# Patient Record
Sex: Male | Born: 1970
Health system: Southern US, Community
[De-identification: ages and names within clinical notes are randomized; demographics above are authoritative.]

## PROBLEM LIST (undated history)

## (undated) DIAGNOSIS — J9383 Other pneumothorax: Secondary | ICD-10-CM

## (undated) DIAGNOSIS — C801 Malignant (primary) neoplasm, unspecified: Secondary | ICD-10-CM

## (undated) DIAGNOSIS — E785 Hyperlipidemia, unspecified: Secondary | ICD-10-CM

## (undated) DIAGNOSIS — E079 Disorder of thyroid, unspecified: Secondary | ICD-10-CM

## (undated) DIAGNOSIS — I10 Essential (primary) hypertension: Secondary | ICD-10-CM

## (undated) DIAGNOSIS — K219 Gastro-esophageal reflux disease without esophagitis: Secondary | ICD-10-CM

## (undated) DIAGNOSIS — T7840XA Allergy, unspecified, initial encounter: Secondary | ICD-10-CM

## (undated) DIAGNOSIS — C73 Malignant neoplasm of thyroid gland: Secondary | ICD-10-CM

## (undated) DIAGNOSIS — W3400XD Accidental discharge from unspecified firearms or gun, subsequent encounter: Secondary | ICD-10-CM

## (undated) DIAGNOSIS — J45909 Unspecified asthma, uncomplicated: Secondary | ICD-10-CM

## (undated) DIAGNOSIS — J939 Pneumothorax, unspecified: Secondary | ICD-10-CM

## (undated) DIAGNOSIS — F1910 Other psychoactive substance abuse, uncomplicated: Secondary | ICD-10-CM

## (undated) DIAGNOSIS — F1410 Cocaine abuse, uncomplicated: Secondary | ICD-10-CM

## (undated) HISTORY — DX: Gastro-esophageal reflux disease without esophagitis: K21.9

## (undated) HISTORY — PX: VIDEO ASSISTED THORACOSCOPY (VATS)/WEDGE RESECTION: SHX6174

## (undated) HISTORY — DX: Allergy, unspecified, initial encounter: T78.40XA

## (undated) HISTORY — PX: CHEST TUBE INSERTION: SHX231

## (undated) HISTORY — PX: TESTICLE TORSION REDUCTION: SHX795

## (undated) HISTORY — DX: Malignant (primary) neoplasm, unspecified: C80.1

## (undated) HISTORY — DX: Accidental discharge from unspecified firearms or gun, subsequent encounter: W34.00XD

## (undated) HISTORY — DX: Disorder of thyroid, unspecified: E07.9

## (undated) HISTORY — DX: Essential (primary) hypertension: I10

## (undated) HISTORY — PX: THYROID SURGERY: SHX805

## (undated) HISTORY — PX: KNEE SURGERY: SHX244

## (undated) HISTORY — DX: Hyperlipidemia, unspecified: E78.5

---
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
## 1997-07-23 ENCOUNTER — Emergency Department (HOSPITAL_COMMUNITY): Admission: EM | Admit: 1997-07-23 | Discharge: 1997-07-23 | Payer: Self-pay | Admitting: Emergency Medicine

## 1998-04-15 ENCOUNTER — Emergency Department (HOSPITAL_COMMUNITY): Admission: EM | Admit: 1998-04-15 | Discharge: 1998-04-15 | Payer: Self-pay | Admitting: Emergency Medicine

## 1998-04-15 ENCOUNTER — Encounter: Payer: Self-pay | Admitting: Emergency Medicine

## 1999-10-10 ENCOUNTER — Emergency Department (HOSPITAL_COMMUNITY): Admission: EM | Admit: 1999-10-10 | Discharge: 1999-10-10 | Payer: Self-pay | Admitting: Emergency Medicine

## 2000-08-16 ENCOUNTER — Encounter: Payer: Self-pay | Admitting: Emergency Medicine

## 2000-08-16 ENCOUNTER — Inpatient Hospital Stay (HOSPITAL_COMMUNITY): Admission: EM | Admit: 2000-08-16 | Discharge: 2000-08-22 | Payer: Self-pay | Admitting: Emergency Medicine

## 2004-03-16 DIAGNOSIS — W3400XD Accidental discharge from unspecified firearms or gun, subsequent encounter: Secondary | ICD-10-CM

## 2004-03-16 HISTORY — DX: Accidental discharge from unspecified firearms or gun, subsequent encounter: W34.00XD

## 2004-04-30 ENCOUNTER — Emergency Department (HOSPITAL_COMMUNITY): Admission: EM | Admit: 2004-04-30 | Discharge: 2004-05-01 | Payer: Self-pay | Admitting: Emergency Medicine

## 2004-07-24 ENCOUNTER — Inpatient Hospital Stay (HOSPITAL_COMMUNITY): Admission: AC | Admit: 2004-07-24 | Discharge: 2004-07-29 | Payer: Self-pay

## 2004-08-06 ENCOUNTER — Ambulatory Visit: Payer: Self-pay | Admitting: Internal Medicine

## 2004-08-06 ENCOUNTER — Ambulatory Visit: Payer: Self-pay | Admitting: *Deleted

## 2004-08-15 ENCOUNTER — Emergency Department (HOSPITAL_COMMUNITY): Admission: EM | Admit: 2004-08-15 | Discharge: 2004-08-15 | Payer: Self-pay | Admitting: Emergency Medicine

## 2004-08-21 ENCOUNTER — Emergency Department (HOSPITAL_COMMUNITY): Admission: EM | Admit: 2004-08-21 | Discharge: 2004-08-21 | Payer: Self-pay | Admitting: Emergency Medicine

## 2005-04-08 ENCOUNTER — Emergency Department (HOSPITAL_COMMUNITY): Admission: EM | Admit: 2005-04-08 | Discharge: 2005-04-08 | Payer: Self-pay | Admitting: Emergency Medicine

## 2007-07-21 ENCOUNTER — Emergency Department (HOSPITAL_COMMUNITY): Admission: EM | Admit: 2007-07-21 | Discharge: 2007-07-21 | Payer: Self-pay | Admitting: Emergency Medicine

## 2008-02-10 ENCOUNTER — Emergency Department (HOSPITAL_COMMUNITY): Admission: EM | Admit: 2008-02-10 | Discharge: 2008-02-10 | Payer: Self-pay | Admitting: Emergency Medicine

## 2008-10-04 ENCOUNTER — Emergency Department (HOSPITAL_COMMUNITY): Admission: EM | Admit: 2008-10-04 | Discharge: 2008-10-04 | Payer: Self-pay | Admitting: Emergency Medicine

## 2008-10-05 ENCOUNTER — Emergency Department (HOSPITAL_COMMUNITY): Admission: EM | Admit: 2008-10-05 | Discharge: 2008-10-05 | Payer: Self-pay | Admitting: Emergency Medicine

## 2009-04-30 ENCOUNTER — Emergency Department (HOSPITAL_COMMUNITY): Admission: EM | Admit: 2009-04-30 | Discharge: 2009-04-30 | Payer: Self-pay | Admitting: Family Medicine

## 2009-08-11 ENCOUNTER — Emergency Department (HOSPITAL_COMMUNITY): Admission: EM | Admit: 2009-08-11 | Discharge: 2009-08-11 | Payer: Self-pay

## 2009-10-09 ENCOUNTER — Emergency Department (HOSPITAL_COMMUNITY): Admission: EM | Admit: 2009-10-09 | Discharge: 2009-10-09 | Payer: Self-pay | Admitting: Emergency Medicine

## 2009-12-08 ENCOUNTER — Emergency Department (HOSPITAL_COMMUNITY): Admission: EM | Admit: 2009-12-08 | Discharge: 2009-12-08 | Payer: Self-pay | Admitting: Emergency Medicine

## 2010-03-16 DIAGNOSIS — C801 Malignant (primary) neoplasm, unspecified: Secondary | ICD-10-CM

## 2010-03-16 HISTORY — DX: Malignant (primary) neoplasm, unspecified: C80.1

## 2010-05-29 LAB — URINALYSIS, ROUTINE W REFLEX MICROSCOPIC
Hgb urine dipstick: NEGATIVE
pH: 7 (ref 5.0–8.0)

## 2010-05-29 LAB — URINE MICROSCOPIC-ADD ON

## 2010-05-31 LAB — URINALYSIS, ROUTINE W REFLEX MICROSCOPIC
Glucose, UA: NEGATIVE mg/dL
Ketones, ur: NEGATIVE mg/dL
Specific Gravity, Urine: 1.017 (ref 1.005–1.030)
Urobilinogen, UA: 1 mg/dL (ref 0.0–1.0)

## 2010-06-02 LAB — URINALYSIS, ROUTINE W REFLEX MICROSCOPIC
Bilirubin Urine: NEGATIVE
Glucose, UA: NEGATIVE mg/dL
Hgb urine dipstick: NEGATIVE
Ketones, ur: NEGATIVE mg/dL
Nitrite: NEGATIVE
Protein, ur: NEGATIVE mg/dL
Specific Gravity, Urine: 1.016 (ref 1.005–1.030)
Urobilinogen, UA: 1 mg/dL (ref 0.0–1.0)
pH: 6.5 (ref 5.0–8.0)

## 2010-06-02 LAB — POCT I-STAT, CHEM 8
Calcium, Ion: 1.13 mmol/L (ref 1.12–1.32)
Hemoglobin: 16 g/dL (ref 13.0–17.0)
Sodium: 143 mEq/L (ref 135–145)
TCO2: 30 mmol/L (ref 0–100)

## 2010-06-04 LAB — GC/CHLAMYDIA PROBE AMP, GENITAL: GC Probe Amp, Genital: NEGATIVE

## 2010-06-22 LAB — CBC
HCT: 43.2 % (ref 39.0–52.0)
Hemoglobin: 14.6 g/dL (ref 13.0–17.0)
MCV: 90 fL (ref 78.0–100.0)
Platelets: 279 10*3/uL (ref 150–400)
RDW: 14.2 % (ref 11.5–15.5)

## 2010-06-22 LAB — BASIC METABOLIC PANEL
Calcium: 9.3 mg/dL (ref 8.4–10.5)
Creatinine, Ser: 1.38 mg/dL (ref 0.4–1.5)
GFR calc non Af Amer: 57 mL/min — ABNORMAL LOW (ref >60)
Sodium: 142 mEq/L (ref 135–145)

## 2010-06-22 LAB — RAPID URINE DRUG SCREEN, HOSP PERFORMED
Barbiturates: NOT DETECTED
Benzodiazepines: NOT DETECTED
Benzodiazepines: NOT DETECTED
Cocaine: POSITIVE — AB
Opiates: NOT DETECTED
Opiates: NOT DETECTED

## 2010-06-22 LAB — DIFFERENTIAL
Basophils Relative: 2 % — ABNORMAL HIGH (ref 0–1)
Eosinophils Absolute: 0.2 10*3/uL (ref 0.0–0.7)
Monocytes Absolute: 0.6 10*3/uL (ref 0.1–1.0)
Neutro Abs: 3.9 10*3/uL (ref 1.7–7.7)
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
## 2010-12-16 LAB — TSH: TSH: 1.059

## 2011-02-21 ENCOUNTER — Encounter: Payer: Self-pay | Admitting: Nurse Practitioner

## 2011-02-21 ENCOUNTER — Emergency Department (HOSPITAL_COMMUNITY): Payer: Self-pay

## 2011-02-21 ENCOUNTER — Emergency Department (HOSPITAL_COMMUNITY)
Admission: EM | Admit: 2011-02-21 | Discharge: 2011-02-21 | Disposition: A | Discharge status: Home / Self Care | Payer: Self-pay | Attending: Emergency Medicine | Admitting: Emergency Medicine

## 2011-02-21 DIAGNOSIS — M545 Low back pain, unspecified: Secondary | ICD-10-CM | POA: Insufficient documentation

## 2011-02-21 DIAGNOSIS — M549 Dorsalgia, unspecified: Secondary | ICD-10-CM

## 2011-02-21 DIAGNOSIS — R3915 Urgency of urination: Secondary | ICD-10-CM | POA: Insufficient documentation

## 2011-02-21 DIAGNOSIS — R319 Hematuria, unspecified: Secondary | ICD-10-CM | POA: Insufficient documentation

## 2011-02-21 DIAGNOSIS — R1032 Left lower quadrant pain: Secondary | ICD-10-CM | POA: Insufficient documentation

## 2011-02-21 DIAGNOSIS — R350 Frequency of micturition: Secondary | ICD-10-CM | POA: Insufficient documentation

## 2011-02-21 LAB — POCT I-STAT, CHEM 8
BUN: 12 mg/dL (ref 6–23)
Calcium, Ion: 0.88 mmol/L — ABNORMAL LOW (ref 1.12–1.32)
Chloride: 108 mEq/L (ref 96–112)
HCT: 43 % (ref 39.0–52.0)
Sodium: 137 mEq/L (ref 135–145)

## 2011-02-21 LAB — DIFFERENTIAL
Basophils Absolute: 0.1 10*3/uL (ref 0.0–0.1)
Eosinophils Relative: 1 % (ref 0–5)
Lymphocytes Relative: 24 % (ref 12–46)
Lymphs Abs: 2.2 10*3/uL (ref 0.7–4.0)
Monocytes Absolute: 0.5 10*3/uL (ref 0.1–1.0)
Monocytes Relative: 6 % (ref 3–12)
Neutro Abs: 6.1 10*3/uL (ref 1.7–7.7)

## 2011-02-21 LAB — CBC
Hemoglobin: 14.3 g/dL (ref 13.0–17.0)
MCH: 30.2 pg (ref 26.0–34.0)
WBC: 8.9 10*3/uL (ref 4.0–10.5)

## 2011-02-21 LAB — URINE MICROSCOPIC-ADD ON

## 2011-02-21 LAB — URINALYSIS, ROUTINE W REFLEX MICROSCOPIC
Bilirubin Urine: NEGATIVE
Specific Gravity, Urine: 1.026 (ref 1.005–1.030)

## 2011-02-21 MED ORDER — KETOROLAC TROMETHAMINE 60 MG/2ML IM SOLN
60.0000 mg | Freq: Once | INTRAMUSCULAR | Status: AC
  Administered 2011-02-21: 60 mg via INTRAMUSCULAR
  Filled 2011-02-21: qty 2

## 2011-02-21 MED ORDER — METHOCARBAMOL 500 MG PO TABS: 500.0000 mg | ORAL_TABLET | Freq: Two times a day (BID) | ORAL | Status: AC

## 2011-02-21 MED ORDER — DICLOFENAC SODIUM 75 MG PO TBEC: 75.0000 mg | DELAYED_RELEASE_TABLET | Freq: Two times a day (BID) | ORAL | Status: DC

## 2011-02-21 MED ORDER — TRAMADOL HCL 50 MG PO TABS: 50.0000 mg | ORAL_TABLET | Freq: Four times a day (QID) | ORAL | Status: AC | PRN

## 2011-02-21 NOTE — ED Notes (Signed)
Pt c/o hematuria onset yesterday. Also mild abd and flank pain.

## 2011-02-21 NOTE — ED Provider Notes (Signed)
Medical screening examination/treatment/procedure(s) were performed by non-physician practitioner and as supervising physician I was immediately available for consultation/collaboration.  Ethelda Chick, MD 02/21/11 1300

## 2011-02-21 NOTE — ED Provider Notes (Signed)
History     CSN: [id number] Arrival date & time: 02/21/2011  9:23 AM   First MD Initiated Contact with Patient 02/21/11 0952     10:46 AM HPI Reports one episode of a blood clot mid stream while urinating yesterday. States has not had blood in his urine since. Reports hematuria associated with mild left-sided lower abdominal pain and left lower back pain. Denies nausea, vomiting, diarrhea, fever, chills, penile discharge, testicular tenderness. Patient is a 40 y.o. male presenting with hematuria. The history is provided by the patient.  Hematuria This is a new problem. The current episode started yesterday. The problem has been resolved since onset. He describes the hematuria as gross hematuria. He reports clotting at the middle of his urine stream. The pain is moderate. He describes his urine color as yellow. Irritative symptoms include frequency and urgency. Obstructive symptoms do not include dribbling. Associated symptoms include abdominal pain and flank pain. Pertinent negatives include no chills, dysuria, fever, genital pain, inability to urinate, nausea or vomiting.    History reviewed. No pertinent past medical history.  Past Surgical History  Procedure Date  . Thyroid surgery     History reviewed. No pertinent family history.  History  Substance Use Topics  . Smoking status: Passive Smoker  . Smokeless tobacco: Not on file  . Alcohol Use:      rare      Review of Systems  Constitutional: Negative for fever and chills.  Gastrointestinal: Positive for abdominal pain. Negative for nausea, vomiting, diarrhea and constipation.  Genitourinary: Positive for urgency, frequency, hematuria and flank pain. Negative for dysuria, penile pain and testicular pain.  Musculoskeletal: Positive for back pain.    Allergies  Review of patient's allergies indicates no known allergies.  Home Medications   Current Outpatient Rx  Name Route Sig Dispense Refill  . LEVOTHYROXINE SODIUM  75 MCG PO TABS Oral Take 75 mcg by mouth daily.        BP 131/88  Pulse 94  Temp(Src) 98.5 F (36.9 C) (Oral)  Resp 16  Ht 6\' 2"  (1.88 m)  Wt 240 lb (108.863 kg)  BMI 30.81 kg/m2  SpO2 96%  Physical Exam  Constitutional: He is oriented to person, place, and time. He appears well-developed and well-nourished.  HENT:  Head: Normocephalic and atraumatic.  Eyes: Conjunctivae are normal. Pupils are equal, round, and reactive to light.  Neck: Normal range of motion. Neck supple.  Cardiovascular: Normal rate, regular rhythm and normal heart sounds.   Pulmonary/Chest: Effort normal and breath sounds normal.  Abdominal: Soft. Bowel sounds are normal. He exhibits no distension and no mass. There is no hepatosplenomegaly. There is no tenderness. There is no rebound, no guarding and no CVA tenderness.  Musculoskeletal:       Lumbar back: Normal.  Neurological: He is alert and oriented to person, place, and time.  Skin: Skin is warm and dry. No rash noted. No erythema. No pallor.  Psychiatric: He has a normal mood and affect. His behavior is normal.    ED Course  Procedures   Results for orders placed during the hospital encounter of 02/21/11  URINALYSIS, ROUTINE W REFLEX MICROSCOPIC      Component Value Range   Color, Urine YELLOW  YELLOW    APPearance CLEAR  CLEAR    Specific Gravity, Urine 1.026  1.005 - 1.030    pH 6.0  5.0 - 8.0    Glucose, UA NEGATIVE  NEGATIVE (mg/dL)   Hgb urine dipstick NEGATIVE  NEGATIVE    Bilirubin Urine NEGATIVE  NEGATIVE    Ketones, ur NEGATIVE  NEGATIVE (mg/dL)   Protein, ur 30 (*) NEGATIVE (mg/dL)   Urobilinogen, UA 0.2  0.0 - 1.0 (mg/dL)   Nitrite NEGATIVE  NEGATIVE    Leukocytes, UA SMALL (*) NEGATIVE   URINE MICROSCOPIC-ADD ON      Component Value Range   Squamous Epithelial / LPF RARE  RARE    WBC, UA 7-10  <3 (WBC/hpf)   Bacteria, UA RARE  RARE    Urine-Other MUCOUS PRESENT    CBC      Component Value Range   WBC 8.9  4.0 - 10.5 (K/uL)     RBC 4.74  4.22 - 5.81 (MIL/uL)   Hemoglobin 14.3  13.0 - 17.0 (g/dL)   HCT 16.1  09.6 - 04.5 (%)   MCV 85.2  78.0 - 100.0 (fL)   MCH 30.2  26.0 - 34.0 (pg)   MCHC 35.4  30.0 - 36.0 (g/dL)   RDW 40.9  81.1 - 91.4 (%)   Platelets 271  150 - 400 (K/uL)  DIFFERENTIAL      Component Value Range   Neutrophils Relative 68  43 - 77 (%)   Neutro Abs 6.1  1.7 - 7.7 (K/uL)   Lymphocytes Relative 24  12 - 46 (%)   Lymphs Abs 2.2  0.7 - 4.0 (K/uL)   Monocytes Relative 6  3 - 12 (%)   Monocytes Absolute 0.5  0.1 - 1.0 (K/uL)   Eosinophils Relative 1  0 - 5 (%)   Eosinophils Absolute 0.1  0.0 - 0.7 (K/uL)   Basophils Relative 1  0 - 1 (%)   Basophils Absolute 0.1  0.0 - 0.1 (K/uL)  POCT I-STAT, CHEM 8      Component Value Range   Sodium 137  135 - 145 (mEq/L)   Potassium 4.1  3.5 - 5.1 (mEq/L)   Chloride 108  96 - 112 (mEq/L)   BUN 12  6 - 23 (mg/dL)   Creatinine, Ser 7.82 (*) 0.50 - 1.35 (mg/dL)   Glucose, Bld 956 (*) 70 - 99 (mg/dL)   Calcium, Ion 2.13 (*) 1.12 - 1.32 (mmol/L)   TCO2 18  0 - 100 (mmol/L)   Hemoglobin 14.6  13.0 - 17.0 (g/dL)   HCT 08.6  57.8 - 46.9 (%)    MDM   Patient has slight elevation in creatinine. This is gradually been increasing for 1-1/2 years. I have discussed this with patient and requests patient to followup with a primary care physician. I have provided patient with referrals for outpatient clinics and family practice Center. Also provided referral for urology if he continues to have blood in urine. Advised patient that he did not have blood in urine today. Discussed normal CT scan with patient. Will discharge patient with anti-inflammatory medication, muscle relaxants, and tramadol. I have also obtained a GC/Chlamydia probe from patient. Chaperone was present during the exam. Patient states he is unsure whether he may have a sexual transmitted disease. Denies dysuria, penile discharge, penile pain or testicular tenderness.       Thomasene Lot,  [place] 02/21/11 1255

## 2011-02-22 LAB — URINE CULTURE
Colony Count: NO GROWTH
Culture: NO GROWTH

## 2011-02-23 LAB — GC/CHLAMYDIA PROBE AMP, GENITAL: Chlamydia, DNA Probe: NEGATIVE

## 2011-03-04 ENCOUNTER — Ambulatory Visit: Payer: Self-pay | Admitting: Internal Medicine

## 2011-03-12 ENCOUNTER — Ambulatory Visit: Payer: Self-pay | Admitting: Internal Medicine

## 2011-04-08 ENCOUNTER — Ambulatory Visit: Payer: Self-pay | Admitting: Internal Medicine

## 2011-04-27 ENCOUNTER — Encounter: Payer: Self-pay | Admitting: Internal Medicine

## 2011-04-27 ENCOUNTER — Ambulatory Visit (INDEPENDENT_AMBULATORY_CARE_PROVIDER_SITE_OTHER): Payer: Self-pay | Admitting: Internal Medicine

## 2011-04-27 VITALS — BP 112/72 | HR 77 | Temp 97.5°F | Resp 16 | Ht 74.0 in | Wt 248.0 lb

## 2011-04-27 DIAGNOSIS — C73 Malignant neoplasm of thyroid gland: Secondary | ICD-10-CM

## 2011-04-27 NOTE — Assessment & Plan Note (Signed)
Records requested from his ENDO and surgeon in the [place] area, I have asked him to see an ENDO here

## 2011-04-27 NOTE — Progress Notes (Signed)
  Subjective:    Patient ID: Nicholas Mccarty, male    DOB: 1970/04/02, 41 y.o.   MRN: [id number]  Thyroid Problem Presents for follow-up (he had surgery about 6 months ago for L lobe thyroid cancer, needs ENDO f/up) visit. Patient reports no anxiety, cold intolerance, constipation, depressed mood, diaphoresis, diarrhea, dry skin, fatigue, hair loss, heat intolerance, hoarse voice, leg swelling, nail problem, palpitations, tremors, visual change, weight gain or weight loss.  He was in prison when thyroid cancer was found, he had surgery twice in the fall of 2012, he is concerned there is more cancer, his last Endo (Dr. Governor Rooks) told him that he needed a radioactive treatment but they will not see him anymore because he does not have insurance.    Review of Systems  Constitutional: Negative for fever, chills, weight loss, weight gain, diaphoresis, activity change, appetite change, fatigue and unexpected weight change.  HENT: Negative for hoarse voice, facial swelling, trouble swallowing, neck pain, neck stiffness and voice change.   Eyes: Negative.   Respiratory: Negative.   Cardiovascular: Negative for chest pain, palpitations and leg swelling.  Gastrointestinal: Negative.  Negative for diarrhea and constipation.  Genitourinary: Negative.   Musculoskeletal: Negative.   Skin: Negative.   Neurological: Negative.  Negative for tremors.  Hematological: Negative for cold intolerance, heat intolerance and adenopathy. Does not bruise/bleed easily.  Psychiatric/Behavioral: Negative.        Objective:   Physical Exam  Vitals reviewed. Constitutional: He is oriented to person, place, and time. He appears well-developed and well-nourished. No distress.  HENT:  Head: Normocephalic and atraumatic.  Mouth/Throat: Oropharynx is clear and moist. No oropharyngeal exudate.  Eyes: Conjunctivae are normal. Right eye exhibits no discharge. Left eye exhibits no discharge. No scleral icterus.  Neck: Trachea  normal and normal range of motion. Neck supple. Normal carotid pulses, no hepatojugular reflux and no JVD present. Carotid bruit is not present. No tracheal deviation present. No mass and no thyromegaly present.    Cardiovascular: Normal rate, regular rhythm and intact distal pulses.  Exam reveals no gallop and no friction rub.   No murmur heard. Pulmonary/Chest: Effort normal and breath sounds normal. No stridor. No respiratory distress. He has no wheezes. He has no rales. He exhibits no tenderness.  Abdominal: Soft. Bowel sounds are normal. He exhibits no distension and no mass. There is no tenderness. There is no rebound and no guarding.  Musculoskeletal: Normal range of motion. He exhibits no edema and no tenderness.  Lymphadenopathy:    He has no cervical adenopathy.  Neurological: He is oriented to person, place, and time.  Skin: Skin is warm and dry. No rash noted. He is not diaphoretic. No erythema. No pallor.  Psychiatric: He has a normal mood and affect. His behavior is normal. Judgment and thought content normal.          Assessment & Plan:

## 2011-05-18 ENCOUNTER — Encounter: Payer: Self-pay | Admitting: Endocrinology

## 2011-05-18 ENCOUNTER — Other Ambulatory Visit (INDEPENDENT_AMBULATORY_CARE_PROVIDER_SITE_OTHER): Payer: Self-pay

## 2011-05-18 ENCOUNTER — Ambulatory Visit (INDEPENDENT_AMBULATORY_CARE_PROVIDER_SITE_OTHER): Payer: Self-pay | Admitting: Endocrinology

## 2011-05-18 VITALS — BP 110/70 | HR 67 | Temp 98.1°F | Ht 74.0 in | Wt 245.0 lb

## 2011-05-18 DIAGNOSIS — E890 Postprocedural hypothyroidism: Secondary | ICD-10-CM

## 2011-05-18 LAB — TSH: TSH: 1.72 u[IU]/mL (ref 0.35–5.50)

## 2011-05-18 NOTE — Patient Instructions (Signed)
Please sign a release of information for your hospital visits in 2012. If you do not get a call back from [place] about these in 1 week, please call back.   blood tests are being requested for you today.  please call [phone] to hear your test results.  You will be prompted to enter the 9-digit "MRN" number that appears at the top left of this page, followed by #.  Then you will hear the message.

## 2011-05-18 NOTE — Progress Notes (Signed)
  Subjective:    Patient ID: Nicholas Mccarty, male    DOB: [birth year], 41 y.o.   MRN: [id number]  HPI Pt says he had a left thyroid lobectomy in 2012, for a nodule.  He says that nodule proved to be benign.  He had repeat thyroid surgery a few mos later.  He says he thinks they found cancer, but he is not sure.  He was rx'ed synthroid 75/day.  He has few mos of slight doe sxs in the chest, but no assoc cough.   Past Medical History  Diagnosis Date  . Cancer 2012    thyroid  . Thyroid disease   . GERD (gastroesophageal reflux disease)     Past Surgical History  Procedure Date  . Thyroid surgery     History   Social History  . Marital Status: Single    Spouse Name: N/A    Number of Children: N/A  . Years of Education: N/A   Occupational History  . Not on file.   Social History Main Topics  . Smoking status: Passive Smoker    Types: Cigarettes  . Smokeless tobacco: Never Used  . Alcohol Use: No     rare  . Drug Use: Yes    Special: Marijuana  . Sexually Active: Not Currently   Other Topics Concern  . Not on file   Social History Narrative  . No narrative on file    Current Outpatient Prescriptions on File Prior to Visit  Medication Sig Dispense Refill  . levothyroxine (SYNTHROID, LEVOTHROID) 75 MCG tablet Take 75 mcg by mouth daily.          No Known Allergies  Family History  Problem Relation Age of Onset  . Cancer Neg Hx   . Heart disease Neg Hx   . Hyperlipidemia Neg Hx   . Hypertension Neg Hx   . Diabetes Neg Hx   . Stroke Neg Hx   no goiter or other thyroid probs.    BP 110/70  Pulse 67  Temp(Src) 98.1 F (36.7 C) (Oral)  Ht 6\' 2"  (1.88 m)  Wt 245 lb (111.131 kg)  BMI 31.46 kg/m2  SpO2 96%    Review of Systems denies depression, hair loss, cramps, sob, weight gain, difficulty with concentration, constipation, numbness, blurry vision, myalgias, dry skin, and syncope.      Objective:   Physical Exam VS: see vs page GEN: no distress HEAD:  head: no deformity eyes: no periorbital swelling, no proptosis external nose and ears are normal Neck: a healed scar is present.  i do not appreciate a nodule in the thyroid or elsewhere in the neck MUSCULOSKELETAL: muscle bulk and strength are grossly normal.  no obvious joint swelling.  gait is normal and steady EXTEMITIES: no edema NEURO:  cn 2-12 grossly intact.   readily moves all 4's.  sensation is intact to touch on all 4's SKIN:  Normal texture and temperature.  No rash or suspicious lesion is visible.   NODES:  None palpable at the neck PSYCH: alert, oriented x3.  Does not appear anxious nor depressed.   Lab Results  Component Value Date   TSH 1.72 05/18/2011      Assessment & Plan:  Apparent h/o thyroid cancer, uncertain type and stage Postsurgical hypothyroidism.  Whether this is appropriate depends on the stage of pt's cancer. DOE, not thyroid-related

## 2011-05-31 ENCOUNTER — Telehealth: Payer: Self-pay | Admitting: Endocrinology

## 2011-05-31 NOTE — Telephone Encounter (Signed)
i called pt 05/31/11.  Pt was not home.  i spoke to his father.  i have reviewed outside records.  The tumor is stage-1, but needs i-131 rx.  Please tell pt to stop synthroid.  Father says he'll ask pt to call back.

## 2011-06-02 ENCOUNTER — Telehealth: Payer: Self-pay

## 2011-06-02 NOTE — Telephone Encounter (Signed)
i have reviewed outside records.  You should have radioactive iodine treatment.  Please stop levothyroxine, and return here for appointment next week.

## 2011-06-02 NOTE — Telephone Encounter (Signed)
Pt informed of MD's advisement. Appointment scheduled 06/10/2011 4:00pm.

## 2011-06-02 NOTE — Telephone Encounter (Signed)
Pt called stating he received message MD left with his father but he is unsure about treatment, Is this going to be scheduled or does pt need to make a follow up to discuss with MD. He is aware that he is to d/c synthroid.

## 2011-06-10 ENCOUNTER — Ambulatory Visit (INDEPENDENT_AMBULATORY_CARE_PROVIDER_SITE_OTHER): Payer: Self-pay | Admitting: Endocrinology

## 2011-06-10 ENCOUNTER — Other Ambulatory Visit (INDEPENDENT_AMBULATORY_CARE_PROVIDER_SITE_OTHER): Payer: Self-pay

## 2011-06-10 ENCOUNTER — Encounter: Payer: Self-pay | Admitting: Endocrinology

## 2011-06-10 VITALS — BP 110/78 | HR 60 | Temp 98.3°F | Ht 74.0 in | Wt 238.0 lb

## 2011-06-10 DIAGNOSIS — E890 Postprocedural hypothyroidism: Secondary | ICD-10-CM

## 2011-06-10 NOTE — Patient Instructions (Addendum)
blood tests are being requested for you today.  We'll call you with results. If the thyroid is low enough, i'll order the radioactive iodine treatment. Call [phone], to see if [facility]  can help you with all or part of your bill.    Resume the levothyroxine 5 days after the treatment, whenever that is.

## 2011-06-11 ENCOUNTER — Other Ambulatory Visit: Payer: Self-pay | Admitting: *Deleted

## 2011-06-11 DIAGNOSIS — E890 Postprocedural hypothyroidism: Secondary | ICD-10-CM

## 2011-06-16 ENCOUNTER — Other Ambulatory Visit (INDEPENDENT_AMBULATORY_CARE_PROVIDER_SITE_OTHER): Payer: Self-pay

## 2011-06-16 DIAGNOSIS — E890 Postprocedural hypothyroidism: Secondary | ICD-10-CM

## 2011-06-16 LAB — TSH: TSH: 2.31 u[IU]/mL (ref 0.35–5.50)

## 2011-06-16 NOTE — Progress Notes (Signed)
  Subjective:    Patient ID: Nicholas Mccarty, male    DOB: 1970/04/23, 41 y.o.   MRN: [id number]  HPI Pt says he had a left thyroid lobectomy in 2012, for a nodule.  He says that nodule proved to be benign.  He had repeat thyroid surgery a few mos later.  He says he thinks they found cancer, but he is not sure.  He was rx'ed synthroid 75/day.  pt states he feels well in general.  i have reviewed outside pathology records Past Medical History  Diagnosis Date  . Cancer 2012    thyroid  . Thyroid disease   . GERD (gastroesophageal reflux disease)     Past Surgical History  Procedure Date  . Thyroid surgery     History   Social History  . Marital Status: Single    Spouse Name: N/A    Number of Children: N/A  . Years of Education: N/A   Occupational History  . Not on file.   Social History Main Topics  . Smoking status: Passive Smoker    Types: Cigarettes  . Smokeless tobacco: Never Used  . Alcohol Use: No     rare  . Drug Use: Yes    Special: Marijuana  . Sexually Active: Not Currently   Other Topics Concern  . Not on file   Social History Narrative  . No narrative on file    Current Outpatient Prescriptions on File Prior to Visit  Medication Sig Dispense Refill  . levothyroxine (SYNTHROID, LEVOTHROID) 75 MCG tablet Take 75 mcg by mouth daily.          No Known Allergies  Family History  Problem Relation Age of Onset  . Cancer Neg Hx   . Heart disease Neg Hx   . Hyperlipidemia Neg Hx   . Hypertension Neg Hx   . Diabetes Neg Hx   . Stroke Neg Hx     BP 110/78  Pulse 60  Temp(Src) 98.3 F (36.8 C) (Oral)  Ht 6\' 2"  (1.88 m)  Wt 238 lb (107.956 kg)  BMI 30.56 kg/m2  SpO2 97%    Review of Systems Denies neck pain    Objective:   Physical Exam VITAL SIGNS:  See vs page GENERAL: no distress Neck: a healed scar is present.  i do not appreciate a nodule in the thyroid or elsewhere in the neck    Lab Results  Component Value Date   TSH 2.11  06/10/2011      Assessment & Plan:  Stage-1 papillary adenocarcinoma of the thyroid.  tsh is not yet high enough for adjuvant i-131 rx

## 2011-06-22 ENCOUNTER — Other Ambulatory Visit: Payer: Self-pay | Admitting: Endocrinology

## 2011-06-22 ENCOUNTER — Other Ambulatory Visit: Payer: Self-pay | Admitting: *Deleted

## 2011-06-22 DIAGNOSIS — E890 Postprocedural hypothyroidism: Secondary | ICD-10-CM

## 2011-06-24 ENCOUNTER — Other Ambulatory Visit (INDEPENDENT_AMBULATORY_CARE_PROVIDER_SITE_OTHER): Payer: Self-pay

## 2011-06-24 DIAGNOSIS — E890 Postprocedural hypothyroidism: Secondary | ICD-10-CM

## 2011-06-24 LAB — TSH: TSH: 1.65 u[IU]/mL (ref 0.35–5.50)

## 2011-07-06 ENCOUNTER — Ambulatory Visit: Payer: Self-pay | Admitting: Endocrinology

## 2011-07-10 ENCOUNTER — Other Ambulatory Visit (INDEPENDENT_AMBULATORY_CARE_PROVIDER_SITE_OTHER): Payer: Self-pay

## 2011-07-10 DIAGNOSIS — E890 Postprocedural hypothyroidism: Secondary | ICD-10-CM

## 2011-07-10 LAB — TSH: TSH: 1.61 u[IU]/mL (ref 0.35–5.50)

## 2011-07-14 ENCOUNTER — Ambulatory Visit: Payer: Self-pay | Admitting: Endocrinology

## 2011-07-16 ENCOUNTER — Encounter: Payer: Self-pay | Admitting: Endocrinology

## 2011-07-16 ENCOUNTER — Ambulatory Visit (INDEPENDENT_AMBULATORY_CARE_PROVIDER_SITE_OTHER): Payer: Self-pay | Admitting: Endocrinology

## 2011-07-16 VITALS — BP 110/74 | HR 74 | Temp 97.3°F | Ht 74.0 in | Wt 242.0 lb

## 2011-07-16 DIAGNOSIS — C73 Malignant neoplasm of thyroid gland: Secondary | ICD-10-CM

## 2011-07-16 NOTE — Progress Notes (Signed)
  Subjective:    Patient ID: Nicholas Mccarty, male    DOB: 1970-08-15, 41 y.o.   MRN: [id number]  HPI Pt says he had a right thyroid lobectomy in 2012, for what proved on pathology to be a 7-cm papillary adenocarcinoma of the thyroid.  He then underwent a completion left lobectomy.  There was no cancer in the left lobe, but there was a 1.3 cm focus of metastatic cancer in a left paratracheal node.  He was advised to d/c synthroid more than 1 month ago, in preparation for i-131 rx.  However, tsh has remained normal.  On further discussion, pt says he has been on no prescription medication x more than 1 month.  pt states he feels well in general, except for slight hoarseness.   Past Medical History  Diagnosis Date  . Cancer 2012    thyroid  . Thyroid disease   . GERD (gastroesophageal reflux disease)     Past Surgical History  Procedure Date  . Thyroid surgery     History   Social History  . Marital Status: Single    Spouse Name: N/A    Number of Children: N/A  . Years of Education: N/A   Occupational History  . Not on file.   Social History Main Topics  . Smoking status: Passive Smoker    Types: Cigarettes  . Smokeless tobacco: Never Used  . Alcohol Use: No     rare  . Drug Use: Yes    Special: Marijuana  . Sexually Active: Not Currently   Other Topics Concern  . Not on file   Social History Narrative  . No narrative on file    No current outpatient prescriptions on file prior to visit.    No Known Allergies  Family History  Problem Relation Age of Onset  . Cancer Neg Hx   . Heart disease Neg Hx   . Hyperlipidemia Neg Hx   . Hypertension Neg Hx   . Diabetes Neg Hx   . Stroke Neg Hx     BP 110/74  Pulse 74  Temp(Src) 97.3 F (36.3 C) (Oral)  Ht 6\' 2"  (1.88 m)  Wt 242 lb (109.77 kg)  BMI 31.07 kg/m2  SpO2 97%  Review of Systems Denies weight change and neck pain    Objective:   Physical Exam VITAL SIGNS:  See vs page GENERAL: no distress Neck: a  healed scar is present.  i do not appreciate a nodule in the thyroid or elsewhere in the neck.  Lab Results  Component Value Date   TSH 1.61 07/10/2011  (i discussed with dr Bradly Chris)   Assessment & Plan:  S/p resection of Stage-1 papillary adenocarcinoma of the thyroid.  He needs adjuvant i-131 rx Persistent euthyroidism despite thyroid hormone withdrawal.  This raises ? Of a significant amount of retained thyroid tissue.   Slight hoarseness, prob not thyroid-related

## 2011-07-16 NOTE — Patient Instructions (Addendum)
i'll discuss this with the thyroid x-ray specialist, and our office will call you back. The specialist will probably recommend a thyroid "scan" (a special, but easy and painless type of thyroid x ray).  It works like this: you go to the x-ray department of the hospital to swallow a pill, which contains a miniscule amount of radiation.  You will not notice any symptoms from this.  You will go back to the x-ray department the next day, to lie down in front of a camera.  The results of this will be sent to me.   (update: i discussed with dr Bradly Chris.  i ordered scan)

## 2011-07-22 ENCOUNTER — Encounter: Payer: Self-pay | Admitting: Neurology

## 2011-07-22 ENCOUNTER — Ambulatory Visit (INDEPENDENT_AMBULATORY_CARE_PROVIDER_SITE_OTHER): Payer: Self-pay | Admitting: Internal Medicine

## 2011-07-22 ENCOUNTER — Encounter: Payer: Self-pay | Admitting: Internal Medicine

## 2011-07-22 VITALS — BP 128/90 | HR 92 | Temp 98.1°F | Resp 16 | Wt 242.0 lb

## 2011-07-22 DIAGNOSIS — M722 Plantar fascial fibromatosis: Secondary | ICD-10-CM

## 2011-07-22 DIAGNOSIS — M792 Neuralgia and neuritis, unspecified: Secondary | ICD-10-CM

## 2011-07-22 DIAGNOSIS — C73 Malignant neoplasm of thyroid gland: Secondary | ICD-10-CM

## 2011-07-22 DIAGNOSIS — E890 Postprocedural hypothyroidism: Secondary | ICD-10-CM

## 2011-07-22 NOTE — Assessment & Plan Note (Signed)
Continue ibuprofen, also he was given pt ed material today

## 2011-07-22 NOTE — Assessment & Plan Note (Signed)
NM test has been ordered by Dr. Everardo All

## 2011-07-22 NOTE — Assessment & Plan Note (Signed)
Continue ibuprofen as needed, I have offered him a neuro eval as well

## 2011-07-22 NOTE — Patient Instructions (Signed)
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
## 2011-08-05 ENCOUNTER — Ambulatory Visit: Payer: Self-pay | Admitting: Neurology

## 2011-09-02 ENCOUNTER — Encounter: Payer: Self-pay | Admitting: Internal Medicine

## 2011-09-02 ENCOUNTER — Other Ambulatory Visit (INDEPENDENT_AMBULATORY_CARE_PROVIDER_SITE_OTHER): Payer: Self-pay

## 2011-09-02 ENCOUNTER — Ambulatory Visit (INDEPENDENT_AMBULATORY_CARE_PROVIDER_SITE_OTHER): Payer: Self-pay | Admitting: Internal Medicine

## 2011-09-02 VITALS — BP 110/74 | HR 65 | Temp 97.1°F | Resp 16 | Wt 241.0 lb

## 2011-09-02 DIAGNOSIS — E890 Postprocedural hypothyroidism: Secondary | ICD-10-CM

## 2011-09-02 DIAGNOSIS — R7309 Other abnormal glucose: Secondary | ICD-10-CM

## 2011-09-02 DIAGNOSIS — C73 Malignant neoplasm of thyroid gland: Secondary | ICD-10-CM

## 2011-09-02 LAB — COMPREHENSIVE METABOLIC PANEL
Alkaline Phosphatase: 53 U/L (ref 39–117)
BUN: 14 mg/dL (ref 6–23)
Creatinine, Ser: 1.6 mg/dL — ABNORMAL HIGH (ref 0.4–1.5)
Glucose, Bld: 92 mg/dL (ref 70–99)
Sodium: 140 mEq/L (ref 135–145)
Total Bilirubin: 0.6 mg/dL (ref 0.3–1.2)

## 2011-09-02 LAB — LIPID PANEL
HDL: 40.4 mg/dL (ref >39.00)
Triglycerides: 169 mg/dL — ABNORMAL HIGH (ref 0.0–149.0)

## 2011-09-02 LAB — CBC WITH DIFFERENTIAL/PLATELET
Basophils Relative: 1.7 % (ref 0.0–3.0)
Eosinophils Relative: 5.7 % — ABNORMAL HIGH (ref 0.0–5.0)
HCT: 43.3 % (ref 39.0–52.0)
Hemoglobin: 14.6 g/dL (ref 13.0–17.0)
Lymphs Abs: 2.5 10*3/uL (ref 0.7–4.0)
MCV: 89.3 fl (ref 78.0–100.0)
Monocytes Absolute: 0.6 10*3/uL (ref 0.1–1.0)
Neutro Abs: 2.9 10*3/uL (ref 1.4–7.7)
Platelets: 304 10*3/uL (ref 150.0–400.0)
RBC: 4.85 Mil/uL (ref 4.22–5.81)

## 2011-09-02 LAB — TSH: TSH: 1.58 u[IU]/mL (ref 0.35–5.50)

## 2011-09-02 LAB — HEMOGLOBIN A1C: Hgb A1c MFr Bld: 5.7 % (ref 4.6–6.5)

## 2011-09-02 NOTE — Assessment & Plan Note (Signed)
He needs a f/up with Dr. Everardo All about this

## 2011-09-02 NOTE — Assessment & Plan Note (Signed)
I will check an a1c today to see if he has DM II

## 2011-09-02 NOTE — Assessment & Plan Note (Addendum)
I will recheck his TSH today, I will also look at his CBC, CMP, and FLP

## 2011-09-02 NOTE — Patient Instructions (Signed)

## 2011-09-02 NOTE — Progress Notes (Signed)
  Subjective:    Patient ID: Nicholas Mccarty, male    DOB: 1971-01-22, 41 y.o.   MRN: [id number]  Thyroid Problem Presents for follow-up visit. Symptoms include fatigue and hoarse voice. Patient reports no anxiety, cold intolerance, constipation, depressed mood, diaphoresis, diarrhea, dry skin, hair loss, heat intolerance, leg swelling, menstrual problem, nail problem, palpitations, tremors, visual change, weight gain or weight loss. The symptoms have been worsening.      Review of Systems  Constitutional: Positive for fatigue. Negative for fever, chills, weight loss, weight gain, diaphoresis, activity change and appetite change.  HENT: Positive for hoarse voice, neck pain and voice change. Negative for hearing loss, ear pain, nosebleeds, congestion, sore throat, facial swelling, rhinorrhea, sneezing, drooling, mouth sores, trouble swallowing, neck stiffness, dental problem, postnasal drip, sinus pressure, tinnitus and ear discharge.   Eyes: Negative.   Respiratory: Negative for apnea, cough, choking, chest tightness, shortness of breath, wheezing and stridor.   Cardiovascular: Negative for chest pain, palpitations and leg swelling.  Gastrointestinal: Negative for nausea, vomiting, abdominal pain, diarrhea, constipation, blood in stool and abdominal distention.  Genitourinary: Negative.  Negative for menstrual problem.  Skin: Negative for color change, pallor, rash and wound.  Neurological: Negative.  Negative for tremors.  Hematological: Negative for cold intolerance, heat intolerance and adenopathy. Does not bruise/bleed easily.  Psychiatric/Behavioral: Negative.        Objective:   Physical Exam  Vitals reviewed. Constitutional: He is oriented to person, place, and time. He appears well-developed and well-nourished.  Non-toxic appearance. He does not have a sickly appearance. He does not appear ill. No distress.  HENT:  Head: Normocephalic and atraumatic.  Mouth/Throat: Oropharynx is  clear and moist. No oropharyngeal exudate.  Eyes: Conjunctivae are normal. Right eye exhibits no discharge. Left eye exhibits no discharge. No scleral icterus.  Neck: Normal range of motion. Neck supple. No JVD present. No tracheal deviation present. No thyromegaly present.  Cardiovascular: Normal rate, regular rhythm, normal heart sounds and intact distal pulses.  Exam reveals no gallop and no friction rub.   No murmur heard. Pulmonary/Chest: Effort normal and breath sounds normal. No stridor. No respiratory distress. He has no wheezes. He has no rales. He exhibits no tenderness.  Abdominal: Soft. Bowel sounds are normal. He exhibits no distension and no mass. There is no tenderness. There is no rebound and no guarding.  Musculoskeletal: Normal range of motion. He exhibits no edema and no tenderness.  Lymphadenopathy:    He has no cervical adenopathy.  Neurological: He is oriented to person, place, and time.  Skin: Skin is warm and dry. No rash noted. He is not diaphoretic. No erythema. No pallor.  Psychiatric: He has a normal mood and affect. His behavior is normal. Judgment and thought content normal.      Lab Results  Component Value Date   WBC 8.9 02/21/2011   HGB 14.6 02/21/2011   HCT 43.0 02/21/2011   PLT 271 02/21/2011   GLUCOSE 107* 02/21/2011   NA 137 02/21/2011   K 4.1 02/21/2011   CL 108 02/21/2011   CREATININE 1.60* 02/21/2011   BUN 12 02/21/2011   CO2 29 10/04/2008   TSH 1.61 07/10/2011      Assessment & Plan:

## 2012-01-07 ENCOUNTER — Emergency Department (HOSPITAL_COMMUNITY): Payer: Self-pay

## 2012-01-07 ENCOUNTER — Emergency Department (HOSPITAL_COMMUNITY)
Admission: EM | Admit: 2012-01-07 | Discharge: 2012-01-07 | Disposition: A | Discharge status: Home / Self Care | Payer: Self-pay | Attending: Emergency Medicine | Admitting: Emergency Medicine

## 2012-01-07 ENCOUNTER — Encounter (HOSPITAL_COMMUNITY): Payer: Self-pay | Admitting: Family Medicine

## 2012-01-07 DIAGNOSIS — F1910 Other psychoactive substance abuse, uncomplicated: Secondary | ICD-10-CM | POA: Insufficient documentation

## 2012-01-07 DIAGNOSIS — Z8719 Personal history of other diseases of the digestive system: Secondary | ICD-10-CM | POA: Insufficient documentation

## 2012-01-07 DIAGNOSIS — S8990XA Unspecified injury of unspecified lower leg, initial encounter: Secondary | ICD-10-CM | POA: Insufficient documentation

## 2012-01-07 DIAGNOSIS — F17200 Nicotine dependence, unspecified, uncomplicated: Secondary | ICD-10-CM | POA: Insufficient documentation

## 2012-01-07 DIAGNOSIS — Z85850 Personal history of malignant neoplasm of thyroid: Secondary | ICD-10-CM | POA: Insufficient documentation

## 2012-01-07 DIAGNOSIS — I498 Other specified cardiac arrhythmias: Secondary | ICD-10-CM | POA: Insufficient documentation

## 2012-01-07 DIAGNOSIS — Y939 Activity, unspecified: Secondary | ICD-10-CM | POA: Insufficient documentation

## 2012-01-07 LAB — CBC WITH DIFFERENTIAL/PLATELET
Basophils Absolute: 0.1 10*3/uL (ref 0.0–0.1)
Basophils Relative: 1 % (ref 0–1)
Eosinophils Absolute: 0.1 10*3/uL (ref 0.0–0.7)
Eosinophils Relative: 1 % (ref 0–5)
HCT: 40.7 % (ref 39.0–52.0)
Lymphocytes Relative: 26 % (ref 12–46)
MCHC: 35.1 g/dL (ref 30.0–36.0)
MCV: 85.5 fL (ref 78.0–100.0)
Monocytes Absolute: 1.4 10*3/uL — ABNORMAL HIGH (ref 0.1–1.0)
Platelets: 324 10*3/uL (ref 150–400)
RDW: 14.1 % (ref 11.5–15.5)
WBC: 11 10*3/uL — ABNORMAL HIGH (ref 4.0–10.5)

## 2012-01-07 LAB — BASIC METABOLIC PANEL
Calcium: 9 mg/dL (ref 8.4–10.5)
Creatinine, Ser: 1.47 mg/dL — ABNORMAL HIGH (ref 0.50–1.35)
GFR calc non Af Amer: 57 mL/min — ABNORMAL LOW (ref >90)
Glucose, Bld: 111 mg/dL — ABNORMAL HIGH (ref 70–99)
Sodium: 138 mEq/L (ref 135–145)

## 2012-01-07 LAB — RAPID URINE DRUG SCREEN, HOSP PERFORMED
Amphetamines: NOT DETECTED
Benzodiazepines: NOT DETECTED
Tetrahydrocannabinol: POSITIVE — AB

## 2012-01-07 LAB — ETHANOL: Alcohol, Ethyl (B): <11 mg/dL (ref 0–11)

## 2012-01-07 MED ORDER — NICOTINE 21 MG/24HR TD PT24
21.0000 mg | MEDICATED_PATCH | Freq: Every day | TRANSDERMAL | Status: DC
  Filled 2012-01-07: qty 1

## 2012-01-07 MED ORDER — ALUM & MAG HYDROXIDE-SIMETH 200-200-20 MG/5ML PO SUSP: 30.0000 mL | ORAL | Status: DC | PRN

## 2012-01-07 MED ORDER — ONDANSETRON HCL 4 MG PO TABS: 4.0000 mg | ORAL_TABLET | Freq: Three times a day (TID) | ORAL | Status: DC | PRN

## 2012-01-07 MED ORDER — SILVER SULFADIAZINE 1 % EX CREA
TOPICAL_CREAM | Freq: Once | CUTANEOUS | Status: AC
  Administered 2012-01-07: 03:00:00 via TOPICAL
  Filled 2012-01-07: qty 50

## 2012-01-07 MED ORDER — IBUPROFEN 200 MG PO TABS
600.0000 mg | ORAL_TABLET | Freq: Three times a day (TID) | ORAL | Status: DC | PRN
  Administered 2012-01-07 (×2): 600 mg via ORAL
  Filled 2012-01-07 (×2): qty 3

## 2012-01-07 MED ORDER — LORAZEPAM 1 MG PO TABS
1.0000 mg | ORAL_TABLET | Freq: Three times a day (TID) | ORAL | Status: DC | PRN
  Administered 2012-01-07: 1 mg via ORAL
  Filled 2012-01-07: qty 1
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
## 2012-01-07 NOTE — ED Notes (Signed)
Pt states that he feels as if his heart is going to stop.  States he has been using crack cocaine for the past five days. Last use was a few hrs ago. Request detox.
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
## 2012-01-07 NOTE — ED Notes (Signed)
Pt discharged via family; pt stated understanding and was given all paperwork; pt stable at time of discharge

## 2012-01-07 NOTE — ED Notes (Signed)
Patient transported to X-ray 

## 2012-04-15 ENCOUNTER — Ambulatory Visit: Payer: Self-pay | Admitting: Internal Medicine

## 2012-04-15 DIAGNOSIS — Z0289 Encounter for other administrative examinations: Secondary | ICD-10-CM

## 2012-07-07 ENCOUNTER — Emergency Department (HOSPITAL_COMMUNITY): Payer: Self-pay

## 2012-07-07 ENCOUNTER — Emergency Department (HOSPITAL_COMMUNITY)
Admission: EM | Admit: 2012-07-07 | Discharge: 2012-07-08 | Disposition: A | Discharge status: Home / Self Care | Payer: Self-pay | Attending: Emergency Medicine | Admitting: Emergency Medicine

## 2012-07-07 ENCOUNTER — Encounter (HOSPITAL_COMMUNITY): Payer: Self-pay | Admitting: *Deleted

## 2012-07-07 DIAGNOSIS — M79609 Pain in unspecified limb: Secondary | ICD-10-CM | POA: Insufficient documentation

## 2012-07-07 DIAGNOSIS — Z8719 Personal history of other diseases of the digestive system: Secondary | ICD-10-CM | POA: Insufficient documentation

## 2012-07-07 DIAGNOSIS — F411 Generalized anxiety disorder: Secondary | ICD-10-CM | POA: Insufficient documentation

## 2012-07-07 DIAGNOSIS — F1210 Cannabis abuse, uncomplicated: Secondary | ICD-10-CM | POA: Insufficient documentation

## 2012-07-07 DIAGNOSIS — Z85850 Personal history of malignant neoplasm of thyroid: Secondary | ICD-10-CM | POA: Insufficient documentation

## 2012-07-07 DIAGNOSIS — R209 Unspecified disturbances of skin sensation: Secondary | ICD-10-CM | POA: Insufficient documentation

## 2012-07-07 DIAGNOSIS — F1410 Cocaine abuse, uncomplicated: Secondary | ICD-10-CM | POA: Insufficient documentation

## 2012-07-07 DIAGNOSIS — R079 Chest pain, unspecified: Secondary | ICD-10-CM | POA: Insufficient documentation

## 2012-07-07 DIAGNOSIS — Z87828 Personal history of other (healed) physical injury and trauma: Secondary | ICD-10-CM | POA: Insufficient documentation

## 2012-07-07 DIAGNOSIS — F17200 Nicotine dependence, unspecified, uncomplicated: Secondary | ICD-10-CM | POA: Insufficient documentation

## 2012-07-07 DIAGNOSIS — R0602 Shortness of breath: Secondary | ICD-10-CM | POA: Insufficient documentation

## 2012-07-07 DIAGNOSIS — IMO0002 Reserved for concepts with insufficient information to code with codable children: Secondary | ICD-10-CM | POA: Insufficient documentation

## 2012-07-07 LAB — POCT I-STAT, CHEM 8
BUN: 20 mg/dL (ref 6–23)
Calcium, Ion: 1.13 mmol/L (ref 1.12–1.23)
HCT: 45 % (ref 39.0–52.0)
Hemoglobin: 15.3 g/dL (ref 13.0–17.0)
Sodium: 141 mEq/L (ref 135–145)
TCO2: 28 mmol/L (ref 0–100)

## 2012-07-07 LAB — POCT I-STAT TROPONIN I: Troponin i, poc: 0 ng/mL (ref 0.00–0.08)

## 2012-07-07 MED ORDER — ASPIRIN 81 MG PO CHEW
324.0000 mg | CHEWABLE_TABLET | Freq: Once | ORAL | Status: AC
  Administered 2012-07-07: 324 mg via ORAL
  Filled 2012-07-07: qty 4

## 2012-07-07 MED ORDER — LORAZEPAM 1 MG PO TABS
1.0000 mg | ORAL_TABLET | Freq: Once | ORAL | Status: AC
  Administered 2012-07-07: 1 mg via ORAL
  Filled 2012-07-07: qty 1

## 2012-07-07 MED ORDER — LORAZEPAM 2 MG/ML IJ SOLN
1.0000 mg | Freq: Once | INTRAMUSCULAR | Status: AC
  Administered 2012-07-07: 1 mg via INTRAVENOUS
  Filled 2012-07-07: qty 1

## 2012-07-07 NOTE — ED Notes (Signed)
Pt. On cardiac monitor. Pt. Placed on 3 liters oxygen per RN, Clovis Riley.

## 2012-07-07 NOTE — ED Notes (Signed)
Pt came in d/t sob, chest pain and left arm pain. Pt states this started about 1 hour ago. Pt c/o numbness in left arm. Pt states type of pain occurred about 2 years ago.
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
## 2012-07-08 DIAGNOSIS — C73 Malignant neoplasm of thyroid gland: Secondary | ICD-10-CM | POA: Insufficient documentation

## 2012-07-08 DIAGNOSIS — K219 Gastro-esophageal reflux disease without esophagitis: Secondary | ICD-10-CM | POA: Insufficient documentation

## 2012-07-08 NOTE — ED Notes (Signed)
Pt ambulated in the hallway by rooms 19-25.  Pt denies any chest pain or shortness of breath.  Noted some unsteady gait.

## 2012-07-08 NOTE — ED Provider Notes (Signed)
History/physical exam/procedure(s) were performed by non-physician practitioner and as supervising physician I was immediately available for consultation/collaboration. I have reviewed all notes and am in agreement with care and plan.   Kendy Haston S Bessie Livingood, MD 07/08/12 1532 

## 2012-08-26 ENCOUNTER — Emergency Department (INDEPENDENT_AMBULATORY_CARE_PROVIDER_SITE_OTHER)
Admission: EM | Admit: 2012-08-26 | Discharge: 2012-08-26 | Disposition: A | Discharge status: Home / Self Care | Payer: Self-pay | Source: Home / Self Care

## 2012-08-26 ENCOUNTER — Other Ambulatory Visit (HOSPITAL_COMMUNITY)
Admission: RE | Admit: 2012-08-26 | Discharge: 2012-08-26 | Disposition: A | Discharge status: Home / Self Care | Payer: Self-pay | Source: Ambulatory Visit | Attending: Family Medicine | Admitting: Family Medicine

## 2012-08-26 ENCOUNTER — Encounter (HOSPITAL_COMMUNITY): Payer: Self-pay

## 2012-08-26 DIAGNOSIS — Z113 Encounter for screening for infections with a predominantly sexual mode of transmission: Secondary | ICD-10-CM | POA: Insufficient documentation

## 2012-08-26 DIAGNOSIS — N390 Urinary tract infection, site not specified: Secondary | ICD-10-CM

## 2012-08-26 DIAGNOSIS — A5903 Trichomonal cystitis and urethritis: Secondary | ICD-10-CM

## 2012-08-26 MED ORDER — METRONIDAZOLE 500 MG PO TABS: 500.0000 mg | ORAL_TABLET | Freq: Two times a day (BID) | ORAL | Status: DC

## 2012-08-26 MED ORDER — CIPROFLOXACIN HCL 500 MG PO TABS: 500.0000 mg | ORAL_TABLET | Freq: Two times a day (BID) | ORAL | Status: DC

## 2012-08-26 NOTE — Patient Instructions (Signed)
Place urinary tract infection patient instructions here.

## 2012-08-26 NOTE — ED Notes (Signed)
Reports he was advised by his partner that he needs to be checked. Has pain w urination and reported blood in UA x 4 days; NAD at present

## 2012-08-26 NOTE — ED Notes (Signed)
Call back number for lab issues verified 

## 2012-08-26 NOTE — Progress Notes (Signed)
  Subjective:    NYRON MOZER is a 42 y.o. male who complains of burning with urination. He has had symptoms for 3 days. Patient also complains of mild blood in urine today. Patient denies back pain and fever. Patient does not have a history of recurrent UTI. Patient does not have a history of pyelonephritis.   States was told about 5 days ago that a previoius sexual partner has trichomonis.  The last encounter was 2 weeks ago.  He has had std testing 2 mo ago that was normal.  No penile discharge currently.  The following portions of the patient's history were reviewed and updated as appropriate: allergies, current medications, past family history, past medical history, past social history, past surgical history and problem list.  Review of Systems Pertinent items are noted in HPI.    Objective:    General appearance: alert, cooperative and appears stated age Head: Normocephalic, without obvious abnormality, atraumatic Eyes: conjunctivae/corneas clear. PERRL, EOM's intact. Fundi benign. Throat: lips, mucosa, and tongue normal; teeth and gums normal Abdomen: soft, non-tender; bowel sounds normal; no masses,  no organomegaly Extremities: extremities normal, atraumatic, no cyanosis or edema Skin: Skin color, texture, turgor normal. No rashes or lesions  Laboratory:  See chart.  Assessment:   See other note  Plan:   See note and rx.

## 2012-11-19 ENCOUNTER — Other Ambulatory Visit (HOSPITAL_COMMUNITY)
Admission: RE | Admit: 2012-11-19 | Discharge: 2012-11-19 | Disposition: A | Discharge status: Home / Self Care | Payer: Self-pay | Source: Ambulatory Visit | Attending: Family Medicine | Admitting: Family Medicine

## 2012-11-19 ENCOUNTER — Emergency Department (INDEPENDENT_AMBULATORY_CARE_PROVIDER_SITE_OTHER)
Admission: EM | Admit: 2012-11-19 | Discharge: 2012-11-19 | Disposition: A | Discharge status: Home / Self Care | Payer: Self-pay | Source: Home / Self Care | Attending: Family Medicine | Admitting: Family Medicine

## 2012-11-19 ENCOUNTER — Encounter (HOSPITAL_COMMUNITY): Payer: Self-pay | Admitting: *Deleted

## 2012-11-19 DIAGNOSIS — S39012A Strain of muscle, fascia and tendon of lower back, initial encounter: Secondary | ICD-10-CM

## 2012-11-19 DIAGNOSIS — S335XXA Sprain of ligaments of lumbar spine, initial encounter: Secondary | ICD-10-CM

## 2012-11-19 DIAGNOSIS — J939 Pneumothorax, unspecified: Secondary | ICD-10-CM

## 2012-11-19 DIAGNOSIS — Z113 Encounter for screening for infections with a predominantly sexual mode of transmission: Secondary | ICD-10-CM | POA: Insufficient documentation

## 2012-11-19 HISTORY — DX: Pneumothorax, unspecified: J93.9

## 2012-11-19 MED ORDER — METHOCARBAMOL 500 MG PO TABS: 500.0000 mg | ORAL_TABLET | Freq: Two times a day (BID) | ORAL | Status: DC

## 2012-11-19 NOTE — ED Notes (Signed)
Pt  Reports  Low  Back   /      Groin  Pain    With    The  Pain  For  sev  Weeks     Pt  States  Was  Seen  approx  1  Month  Ago  For  Some  Type  Of      Std  Infection  Yet  Did  Not  Complete  All of  His  meds    He  denys  Any  Discharge
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
## 2012-12-14 ENCOUNTER — Inpatient Hospital Stay (HOSPITAL_COMMUNITY)
Admission: EM | Admit: 2012-12-14 | Discharge: 2012-12-17 | DRG: 201 | Disposition: A | Discharge status: Home / Self Care | Payer: Self-pay | Attending: Surgery | Admitting: Surgery

## 2012-12-14 ENCOUNTER — Emergency Department (HOSPITAL_COMMUNITY): Payer: Self-pay

## 2012-12-14 ENCOUNTER — Encounter (HOSPITAL_COMMUNITY): Payer: Self-pay

## 2012-12-14 DIAGNOSIS — J939 Pneumothorax, unspecified: Secondary | ICD-10-CM

## 2012-12-14 DIAGNOSIS — F17200 Nicotine dependence, unspecified, uncomplicated: Secondary | ICD-10-CM | POA: Diagnosis present

## 2012-12-14 DIAGNOSIS — Z23 Encounter for immunization: Secondary | ICD-10-CM

## 2012-12-14 DIAGNOSIS — Z85850 Personal history of malignant neoplasm of thyroid: Secondary | ICD-10-CM

## 2012-12-14 DIAGNOSIS — J9383 Other pneumothorax: Principal | ICD-10-CM | POA: Diagnosis present

## 2012-12-14 DIAGNOSIS — J930 Spontaneous tension pneumothorax: Secondary | ICD-10-CM

## 2012-12-14 DIAGNOSIS — K219 Gastro-esophageal reflux disease without esophagitis: Secondary | ICD-10-CM | POA: Diagnosis present

## 2012-12-14 DIAGNOSIS — R0602 Shortness of breath: Secondary | ICD-10-CM

## 2012-12-14 LAB — BASIC METABOLIC PANEL
CO2: 24 mEq/L (ref 19–32)
Calcium: 9.2 mg/dL (ref 8.4–10.5)
Glucose, Bld: 92 mg/dL (ref 70–99)
Sodium: 140 mEq/L (ref 135–145)

## 2012-12-14 LAB — CBC
Hemoglobin: 14.2 g/dL (ref 13.0–17.0)
MCH: 30.3 pg (ref 26.0–34.0)
RBC: 4.69 MIL/uL (ref 4.22–5.81)

## 2012-12-14 LAB — TROPONIN I: Troponin I: <0.3 ng/mL (ref <0.30)

## 2012-12-14 MED ORDER — DOCUSATE SODIUM 100 MG PO CAPS
100.0000 mg | ORAL_CAPSULE | Freq: Two times a day (BID) | ORAL | Status: DC
  Administered 2012-12-14 – 2012-12-16 (×5): 100 mg via ORAL
  Filled 2012-12-14 (×7): qty 1

## 2012-12-14 MED ORDER — LEVOTHYROXINE SODIUM 75 MCG PO TABS
75.0000 ug | ORAL_TABLET | Freq: Every day | ORAL | Status: DC
  Administered 2012-12-15 – 2012-12-17 (×3): 75 ug via ORAL
  Filled 2012-12-14 (×4): qty 1

## 2012-12-14 MED ORDER — SODIUM CHLORIDE 0.9 % IJ SOLN
3.0000 mL | Freq: Two times a day (BID) | INTRAMUSCULAR | Status: DC
  Administered 2012-12-14 – 2012-12-16 (×4): 3 mL via INTRAVENOUS

## 2012-12-14 MED ORDER — SODIUM CHLORIDE 0.9 % IJ SOLN: 3.0000 mL | INTRAMUSCULAR | Status: DC | PRN

## 2012-12-14 MED ORDER — OXYCODONE HCL 5 MG PO TABS
5.0000 mg | ORAL_TABLET | ORAL | Status: DC | PRN
  Administered 2012-12-14: 5 mg via ORAL
  Filled 2012-12-14: qty 1

## 2012-12-14 MED ORDER — SENNA 8.6 MG PO TABS
1.0000 | ORAL_TABLET | Freq: Two times a day (BID) | ORAL | Status: DC
  Administered 2012-12-14 – 2012-12-16 (×5): 8.6 mg via ORAL
  Filled 2012-12-14 (×7): qty 1

## 2012-12-14 MED ORDER — MIDAZOLAM HCL 2 MG/2ML IJ SOLN
2.0000 mg | Freq: Once | INTRAMUSCULAR | Status: AC
  Administered 2012-12-14: 2 mg via INTRAVENOUS
  Filled 2012-12-14: qty 2

## 2012-12-14 MED ORDER — PNEUMOCOCCAL VAC POLYVALENT 25 MCG/0.5ML IJ INJ
0.5000 mL | INJECTION | INTRAMUSCULAR | Status: AC
  Administered 2012-12-15: 0.5 mL via INTRAMUSCULAR
  Filled 2012-12-14: qty 0.5

## 2012-12-14 MED ORDER — ASPIRIN 81 MG PO CHEW
324.0000 mg | CHEWABLE_TABLET | Freq: Once | ORAL | Status: AC
  Administered 2012-12-14: 324 mg via ORAL
  Filled 2012-12-14: qty 4

## 2012-12-14 MED ORDER — OXYCODONE HCL 5 MG PO TABS
10.0000 mg | ORAL_TABLET | ORAL | Status: DC | PRN
  Administered 2012-12-14 – 2012-12-17 (×15): 10 mg via ORAL
  Filled 2012-12-14 (×16): qty 2

## 2012-12-14 MED ORDER — SODIUM CHLORIDE 0.9 % IV SOLN: 250.0000 mL | INTRAVENOUS | Status: DC | PRN

## 2012-12-14 MED ORDER — ENOXAPARIN SODIUM 40 MG/0.4ML ~~LOC~~ SOLN
40.0000 mg | SUBCUTANEOUS | Status: DC
  Administered 2012-12-14 – 2012-12-16 (×3): 40 mg via SUBCUTANEOUS
  Filled 2012-12-14 (×4): qty 0.4

## 2012-12-14 MED ORDER — ASPIRIN 325 MG PO TABS: 325.0000 mg | ORAL_TABLET | ORAL | Status: DC

## 2012-12-14 MED ORDER — INFLUENZA VAC SPLIT QUAD 0.5 ML IM SUSP
0.5000 mL | INTRAMUSCULAR | Status: AC
  Administered 2012-12-15: 0.5 mL via INTRAMUSCULAR
  Filled 2012-12-14: qty 0.5
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
## 2012-12-14 NOTE — ED Notes (Signed)
Per [facility]- Pt c/o of painful breathing that started yesterday that increased today. Denies injury with supporting cardiac symptoms.  Denies NV/D and fever and CP. Pt reports may have had wheezing  earlier yet no wheezing at present

## 2012-12-14 NOTE — ED Notes (Signed)
AWARE OF PAIN MED GIVEN BELONGING GIVEN TO FAMILY AT DISCHARGE BAG X 1

## 2012-12-14 NOTE — ED Notes (Signed)
MD at bedside.  EDP Pollina present to evaluate pt- aware of lung status. Pt at present NAD chest tube and container at [facility]

## 2012-12-14 NOTE — Progress Notes (Signed)
[facility] provided pt with a list of primary care resources. Patient stated he was pending Medicaid.
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
## 2012-12-14 NOTE — ED Notes (Signed)
MD at bedside. 

## 2012-12-14 NOTE — CV Procedure (Signed)
KAELIN BONELLI [id number] 12/14/2012  Chest Tube Insertion Procedure Note  Indications:  Clinically significant Pneumothorax  Pre-operative Diagnosis: Pneumothorax  Post-operative Diagnosis: Pneumothorax  Procedure: Insertion of right chest tube  Procedure Details  Informed consent was obtained for the procedure, including sedation.  Risks of lung perforation, hemorrhage, arrhythmia, and adverse drug reaction were discussed.   After sterile skin prep, using standard technique, a 20 French tube was placed in the right lateral 8th rib space.  Findings: None  Estimated Blood Loss:  Minimal         Specimens:  None              Complications:  None; patient tolerated the procedure well.         Disposition: transferred to [facility] for admission          Condition: stable  Attending Attestation: I performed the procedure.

## 2012-12-14 NOTE — ED Notes (Addendum)
Pt admits to this writer onset of chest pressure this am. Center Chest pressure only- " feels like some one sitting on my chest" shortness of breath for several days. Used someone else albuterol inhaler yesterday without relief. Denies any other symptoms at present. Pt has taken tums without relief

## 2012-12-14 NOTE — ED Notes (Signed)
Bed: ZO10 Expected date:  Expected time:  Means of arrival:  Comments: ems- pain on movement

## 2012-12-14 NOTE — ED Notes (Signed)
Pt took muscle relaxer this am without relief

## 2012-12-15 ENCOUNTER — Inpatient Hospital Stay (HOSPITAL_COMMUNITY): Payer: Self-pay

## 2012-12-15 NOTE — Care Management Note (Unsigned)
    Page 1 of 1   12/15/2012     3:20:12 PM   CARE MANAGEMENT NOTE 12/15/2012  Patient:  Nicholas Mccarty, Nicholas Mccarty   Account Number:  [id number]  Date Initiated:  12/15/2012  Documentation initiated by:  Kashari Chalmers  Subjective/Objective Assessment:   PT ADM ON 12/14/12 WITH RT PNEUMOTHORAX.  PTA, PT INDEPENDENT, LIVES WITH [facility].     Action/Plan:   WILL FOLLOW FOR DISCHARGE NEEDS AS PT PROGRESSES.   Anticipated DC Date:  12/16/2012   Anticipated DC Plan:  HOME/SELF CARE      DC Planning Services  CM consult      Choice offered to / List presented to:             Status of service:  In process, will continue to follow Medicare Important Message given?   (If response is "NO", the following Medicare IM given date fields will be blank) Date Medicare IM given:   Date Additional Medicare IM given:    Discharge Disposition:    Per UR Regulation:  Reviewed for med. necessity/level of care/duration of stay  If discussed at Long Length of Stay Meetings, dates discussed:    Comments:

## 2012-12-15 NOTE — Progress Notes (Addendum)
       [address]       [place] [address]             [phone]               Subjective: Comfortable, no complaints.  Just returned from xray. Pain controlled and breathing stable.    Objective: Vital signs in last 24 hours: Patient Vitals for the past 24 hrs:  BP Temp Temp src Pulse Resp SpO2 Height Weight  12/15/12 0415 123/81 mmHg 98.6 F (37 C) Oral 62 20 97 % - -  12/14/12 2226 - - - - - - 6\' 2"  (1.88 m) 235 lb (106.595 kg)  12/14/12 1900 137/94 mmHg 98.7 F (37.1 C) Oral 75 20 98 % - -  12/14/12 1814 120/79 mmHg 98.7 F (37.1 C) Oral 71 20 100 % - -  12/14/12 1730 126/75 mmHg - - - 18 - - -  12/14/12 1630 103/68 mmHg - - 67 14 98 % - -  12/14/12 1600 113/88 mmHg - - 74 25 93 % - -  12/14/12 1425 118/85 mmHg 98.6 F (37 C) Oral - 17 97 % - -  12/14/12 1139 - - - - - 100 % - -   Current Weight  12/14/12 235 lb (106.595 kg)     Intake/Output from previous day: 10/01 0701 - 10/02 0700 In: 1240 [P.O.:240; I.V.:1000] Out: 2250 [Urine:2250]    PHYSICAL EXAM:  Heart: RRR Lungs: Slightly decreased BS on R Chest tube: No air leak on water seal    Lab Results: CBC: Recent Labs  12/14/12 1215  WBC 9.6  HGB 14.2  HCT 40.9  PLT 266   BMET:  Recent Labs  12/14/12 1215  NA 140  K 3.8  CL 104  CO2 24  GLUCOSE 92  BUN 15  CREATININE 1.16  CALCIUM 9.2    PT/INR: No results found for this basename: LABPROT, INR,  in the last 72 hours  CXR: slightly increased R ptx, new basilar component  Assessment/Plan: S/P R CT for spontaneous ptx- Continue CT to suction - no air leak when on water seal, but increased basilar component to his ptx. May need to increase suction.    LOS: 1 day    COLLINS,GINA H 12/15/2012   Chart reviewed, patient examined, agree with above. CXR still shows some apical ptx. I think the tube went into the fissure. I have not seen any air leak. Will put to water seal and repeat cxr in the am.

## 2012-12-16 ENCOUNTER — Inpatient Hospital Stay (HOSPITAL_COMMUNITY): Payer: Self-pay

## 2012-12-16 NOTE — ED Provider Notes (Signed)
Medical screening examination/treatment/procedure(s) were conducted as a shared visit with non-physician practitioner(s) and myself.  I personally evaluated the patient during the encounter  Presented with chest pain and shortness of breath. Chest XRAY reviewed - large PTx on right requiring chest tube. CT surgery to admit.  Gilda Crease, MD 12/16/12 [phone]

## 2012-12-16 NOTE — Progress Notes (Addendum)
       [address]       [place] [address]             [phone]               Subjective: Comfortable, no complaints.   Objective: Vital signs in last 24 hours: Patient Vitals for the past 24 hrs:  BP Temp Temp src Pulse Resp SpO2  12/16/12 0349 146/82 mmHg 99.4 F (37.4 C) Oral 76 18 96 %  12/15/12 1941 146/90 mmHg 97.2 F (36.2 C) Oral 77 18 95 %  12/15/12 1426 141/82 mmHg 97.7 F (36.5 C) Oral 73 20 100 %   Current Weight  12/14/12 235 lb (106.595 kg)     Intake/Output from previous day: 10/02 0701 - 10/03 0700 In: 480 [P.O.:480] Out: 1925 [Urine:1925]    PHYSICAL EXAM:  Heart: RRR Lungs: Clear, better air exchange Chest tube: No air leak    Lab Results: CBC: Recent Labs  12/14/12 1215  WBC 9.6  HGB 14.2  HCT 40.9  PLT 266   BMET:  Recent Labs  12/14/12 1215  NA 140  K 3.8  CL 104  CO2 24  GLUCOSE 92  BUN 15  CREATININE 1.16  CALCIUM 9.2    PT/INR: No results found for this basename: LABPROT, INR,  in the last 72 hours  CXR: FINDINGS:  Right chest tube remains in place. No substantial change in the  right apical pneumothorax. Right base atelectasis again noted. Heart  size is within normal limits. Telemetry leads overlie the chest.  IMPRESSION:  No substantial interval change. Persistent right-sided pneumothorax  with right chest tube still in place.   Assessment/Plan: S/P R CT for spontaneous ptx- CXR stable apical ptx, no air leak. Continue CT to water seal.   LOS: 2 days    COLLINS,GINA H 12/16/2012   Chart reviewed, patient examined, agree with above. He still has no air leak for 3 days and the residual right apical ptx is stable. Will remove the chest tube and do a cxr afterward. If the ptx is unchanged on cxr in the am he can go home with follow up in the office in a week with cxr.

## 2012-12-17 ENCOUNTER — Inpatient Hospital Stay (HOSPITAL_COMMUNITY): Payer: MEDICAID

## 2012-12-17 MED ORDER — OXYCODONE HCL 10 MG PO TABS: 10.0000 mg | ORAL_TABLET | Freq: Four times a day (QID) | ORAL | Status: DC | PRN

## 2012-12-17 NOTE — Progress Notes (Addendum)
[address]       [place] [address]             [phone]           Subjective: Feels well, no new issues, mild discomfort  Objective  Telemetry sinus rhythm   Temp:  [98.1 F (36.7 C)] 98.1 F (36.7 C) (10/04 0619) Pulse Rate:  [78-86] 78 (10/04 0619) Resp:  [18-19] 19 (10/04 0619) BP: (127-135)/(80-89) 127/89 mmHg (10/04 0619) SpO2:  [96 %-98 %] 96 % (10/04 0619)   Intake/Output Summary (Last 24 hours) at 12/17/12 1003 Last data filed at 12/16/12 1300  Gross per 24 hour  Intake    240 ml  Output      0 ml  Net    240 ml       General appearance: alert, cooperative and no distress Heart: regular rate and rhythm Lungs: clear to auscultation bilaterally  Lab Results:  Recent Labs  12/14/12 1215  NA 140  K 3.8  CL 104  CO2 24  GLUCOSE 92  BUN 15  CREATININE 1.16  CALCIUM 9.2   No results found for this basename: AST, ALT, ALKPHOS, BILITOT, PROT, ALBUMIN,  in the last 72 hours No results found for this basename: LIPASE, AMYLASE,  in the last 72 hours  Recent Labs  12/14/12 1215  WBC 9.6  HGB 14.2  HCT 40.9  MCV 87.2  PLT 266    Recent Labs  12/14/12 1215  TROPONINI <0.30   No components found with this basename: POCBNP,  No results found for this basename: DDIMER,  in the last 72 hours No results found for this basename: HGBA1C,  in the last 72 hours No results found for this basename: CHOL, HDL, LDLCALC, TRIG, CHOLHDL,  in the last 72 hours No results found for this basename: TSH, T4TOTAL, FREET3, T3FREE, THYROIDAB,  in the last 72 hours No results found for this basename: VITAMINB12, FOLATE, FERRITIN, TIBC, IRON, RETICCTPCT,  in the last 72 hours  Medications: Scheduled . docusate sodium  100 mg Oral BID  . enoxaparin (LOVENOX) injection  40 mg Subcutaneous Q24H  . levothyroxine  75 mcg Oral QAC breakfast  . senna  1 tablet Oral BID  . sodium chloride  3 mL Intravenous Q12H     Radiology/Studies:  Dg  Chest 2 View  12/17/2012   *RADIOLOGY REPORT*  Clinical Data: Post right-sided chest tube removal, evaluate residual pneumothorax  CHEST - 2 VIEW  Comparison: 12/16/2012; 12/15/2012  Findings:  Grossly unchanged cardiac silhouette and mediastinal contours. Unchanged small right apical pneumothorax.  Overall improved aeration of the lungs with persistent diffuse slightly nodular thickening of the pulmonary interstitium.  No new focal airspace opacity.  No pleural effusion.  No evidence of edema.  Unchanged bones.  IMPRESSION: Unchanged small right apical pneumothorax.   Original Report Authenticated By: Tacey Ruiz, MD   Dg Chest 2 View  12/16/2012   CLINICAL DATA:  Status post right chest tube removal  EXAM: CHEST  2 VIEW  COMPARISON:  12/16/2012 721 hrs  FINDINGS: The cardiac shadow is stable. The left lung remains clear. The right lung is well aerated following chest tube removal. A small pneumothorax is identified although it is decreased when compared with the prior pre removal examination.  IMPRESSION: Status post right chest tube removal with small right pneumothorax. The overall appearance however is improved from earlier in the same day.   Electronically Signed  By: Alcide Clever M.D.   On: 12/16/2012 16:16   Dg Chest 2 View  12/16/2012   CLINICAL DATA:  Right pneumothorax  EXAM: CHEST  2 VIEW  COMPARISON:  Multiple recent previous exams.  FINDINGS: Right chest tube remains in place. No substantial change in the right apical pneumothorax. Right base atelectasis again noted. Heart size is within normal limits. Telemetry leads overlie the chest.  IMPRESSION: No substantial interval change. Persistent right-sided pneumothorax with right chest tube still in place.   Electronically Signed   By: Kennith Center M.D.   On: 12/16/2012 07:46    INR: Will add last result for INR, ABG once components are cofirmed Will add last 4 CBG results once components are confirmed  Assessment/Plan: S/P  right spont  pntx- CXR is stable with tube out Plan for discharge: see discharge orders   LOS: 3 days    Nicholas Mccarty,Nicholas Mccarty 10/4/201410:03 AM  Seen and examined, agree with above, CXR stable small residual pneumo
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
## 2012-12-26 ENCOUNTER — Other Ambulatory Visit: Payer: Self-pay | Admitting: *Deleted

## 2012-12-26 DIAGNOSIS — J939 Pneumothorax, unspecified: Secondary | ICD-10-CM

## 2012-12-28 ENCOUNTER — Ambulatory Visit
Admission: RE | Admit: 2012-12-28 | Discharge: 2012-12-28 | Disposition: A | Discharge status: Home / Self Care | Payer: No Typology Code available for payment source | Source: Ambulatory Visit | Attending: Surgery | Admitting: Surgery

## 2012-12-28 ENCOUNTER — Ambulatory Visit (INDEPENDENT_AMBULATORY_CARE_PROVIDER_SITE_OTHER): Payer: Self-pay | Admitting: Surgery

## 2012-12-28 ENCOUNTER — Encounter: Payer: Self-pay | Admitting: Surgery

## 2012-12-28 VITALS — BP 140/82 | HR 80 | Resp 20 | Ht 74.0 in | Wt 235.0 lb

## 2012-12-28 DIAGNOSIS — J9383 Other pneumothorax: Secondary | ICD-10-CM

## 2012-12-28 DIAGNOSIS — J939 Pneumothorax, unspecified: Secondary | ICD-10-CM

## 2012-12-28 NOTE — Progress Notes (Signed)
       [address]       [place] [address]             [phone]        HPI:  The patient returns today for followup status post recent spontaneous right pneumothorax treated with a chest tube on 12/14/2012. He had a tiny right apical pneumothorax after chest tube removal. He said he is feeling well overall without shortness of breath. He has some discomfort at the chest tube site.  Current Outpatient Prescriptions  Medication Sig Dispense Refill  . levothyroxine (SYNTHROID, LEVOTHROID) 75 MCG tablet Take 75 mcg by mouth daily before breakfast.      . oxyCODONE 10 MG TABS Take 1 tablet (10 mg total) by mouth every 6 (six) hours as needed for pain.  30 tablet  0   No current facility-administered medications for this visit.     Physical Exam: BP 140/82  Pulse 80  Resp 20  Ht 6\' 2"  (1.88 m)  Wt 235 lb (106.595 kg)  BMI 30.16 kg/m2  SpO2 97% He looks well. Lung exam is clear. The chest site is healing well.  Diagnostic Tests:  CLINICAL DATA:  Right-sided chest pain. History of right pneumothorax.   EXAM: CHEST  2 VIEW   COMPARISON:  12/17/2012.   FINDINGS: Trachea is midline. Heart size normal. Lungs are clear. No pleural fluid. No pneumothorax.   IMPRESSION: No acute findings. No evidence of a recurrent pneumothorax.     Electronically Signed   By: Leanna Battles M.D.   On: 12/28/2012 14:18     Impression/Plan:  He is doing well following treatment for his first episode of spontaneous right pneumothorax. He is out of pain medication and is still having significant discomfort so I wrote a prescription for oxycodone IR 10 mg by mouth every 6 hrs prn pain #15. I told him he can take ibuprofen for pain as needed. He understands that there is a 20% chance of this pneumothorax recurring over the next year and he will contact us or go to the ER if he develops recurrent chest pain or shortness of breath.

## 2013-01-02 ENCOUNTER — Inpatient Hospital Stay (HOSPITAL_COMMUNITY)
Admission: EM | Admit: 2013-01-02 | Discharge: 2013-01-06 | DRG: 165 | Disposition: A | Discharge status: Home / Self Care | Payer: Self-pay | Attending: Cardiothoracic Surgery | Admitting: Cardiothoracic Surgery

## 2013-01-02 ENCOUNTER — Encounter (HOSPITAL_COMMUNITY): Payer: Self-pay | Admitting: Emergency Medicine

## 2013-01-02 ENCOUNTER — Encounter: Payer: Self-pay | Admitting: Cardiothoracic Surgery

## 2013-01-02 ENCOUNTER — Inpatient Hospital Stay (HOSPITAL_COMMUNITY): Payer: Self-pay

## 2013-01-02 ENCOUNTER — Emergency Department (HOSPITAL_COMMUNITY): Payer: Self-pay

## 2013-01-02 DIAGNOSIS — J939 Pneumothorax, unspecified: Secondary | ICD-10-CM | POA: Insufficient documentation

## 2013-01-02 DIAGNOSIS — J439 Emphysema, unspecified: Secondary | ICD-10-CM | POA: Diagnosis present

## 2013-01-02 DIAGNOSIS — F17200 Nicotine dependence, unspecified, uncomplicated: Secondary | ICD-10-CM | POA: Diagnosis present

## 2013-01-02 DIAGNOSIS — F1210 Cannabis abuse, uncomplicated: Secondary | ICD-10-CM | POA: Diagnosis present

## 2013-01-02 DIAGNOSIS — J930 Spontaneous tension pneumothorax: Secondary | ICD-10-CM

## 2013-01-02 DIAGNOSIS — J9383 Other pneumothorax: Secondary | ICD-10-CM

## 2013-01-02 DIAGNOSIS — Z85850 Personal history of malignant neoplasm of thyroid: Secondary | ICD-10-CM

## 2013-01-02 HISTORY — DX: Malignant neoplasm of thyroid gland: C73

## 2013-01-02 HISTORY — DX: Other psychoactive substance abuse, uncomplicated: F19.10

## 2013-01-02 HISTORY — DX: Other pneumothorax: J93.83

## 2013-01-02 HISTORY — DX: Pneumothorax, unspecified: J93.9

## 2013-01-02 LAB — CBC
HCT: 40.4 % (ref 39.0–52.0)
Hemoglobin: 14.1 g/dL (ref 13.0–17.0)
MCH: 30.1 pg (ref 26.0–34.0)
MCHC: 34.9 g/dL (ref 30.0–36.0)
MCV: 86.1 fL (ref 78.0–100.0)
Platelets: 287 10*3/uL (ref 150–400)
RBC: 4.69 MIL/uL (ref 4.22–5.81)
RDW: 13 % (ref 11.5–15.5)
WBC: 7.2 10*3/uL (ref 4.0–10.5)

## 2013-01-02 LAB — POCT I-STAT TROPONIN I: Troponin i, poc: 0 ng/mL (ref 0.00–0.08)

## 2013-01-02 LAB — PRO B NATRIURETIC PEPTIDE: Pro B Natriuretic peptide (BNP): 7.8 pg/mL (ref 0–125)

## 2013-01-02 LAB — BASIC METABOLIC PANEL
Calcium: 9.1 mg/dL (ref 8.4–10.5)
Chloride: 106 mEq/L (ref 96–112)
Creatinine, Ser: 1.32 mg/dL (ref 0.50–1.35)
GFR calc Af Amer: 74 mL/min — ABNORMAL LOW (ref >90)
Glucose, Bld: 90 mg/dL (ref 70–99)

## 2013-01-02 LAB — BASIC METABOLIC PANEL WITH GFR
BUN: 13 mg/dL (ref 6–23)
CO2: 30 meq/L (ref 19–32)
GFR calc non Af Amer: 64 mL/min — ABNORMAL LOW (ref >90)
Potassium: 4 meq/L (ref 3.5–5.1)
Sodium: 144 meq/L (ref 135–145)

## 2013-01-02 MED ORDER — IOHEXOL 300 MG/ML  SOLN
100.0000 mL | Freq: Once | INTRAMUSCULAR | Status: AC | PRN
  Administered 2013-01-02: 100 mL via INTRAVENOUS

## 2013-01-02 MED ORDER — MIDAZOLAM HCL 2 MG/2ML IJ SOLN
2.0000 mg | Freq: Once | INTRAMUSCULAR | Status: AC
  Administered 2013-01-02: 2 mg via INTRAVENOUS
  Filled 2013-01-02: qty 2

## 2013-01-02 MED ORDER — OXYCODONE HCL 5 MG PO TABS
5.0000 mg | ORAL_TABLET | ORAL | Status: DC | PRN
  Administered 2013-01-03: 5 mg via ORAL
  Filled 2013-01-02: qty 1

## 2013-01-02 MED ORDER — KCL IN DEXTROSE-NACL 20-5-0.2 MEQ/L-%-% IV SOLN
INTRAVENOUS | Status: DC
  Administered 2013-01-03: 01:00:00 via INTRAVENOUS
  Filled 2013-01-02 (×3): qty 1000

## 2013-01-02 MED ORDER — DOCUSATE SODIUM 100 MG PO CAPS
100.0000 mg | ORAL_CAPSULE | Freq: Two times a day (BID) | ORAL | Status: DC
  Filled 2013-01-02 (×2): qty 1

## 2013-01-02 MED ORDER — MORPHINE SULFATE 4 MG/ML IJ SOLN
4.0000 mg | Freq: Once | INTRAMUSCULAR | Status: AC
  Administered 2013-01-02: 4 mg via INTRAVENOUS
  Filled 2013-01-02: qty 1

## 2013-01-02 MED ORDER — MORPHINE SULFATE 2 MG/ML IJ SOLN
2.0000 mg | Freq: Once | INTRAMUSCULAR | Status: AC
  Administered 2013-01-02: 2 mg via INTRAVENOUS
  Filled 2013-01-02: qty 1

## 2013-01-02 NOTE — ED Notes (Signed)
Pt. reports right chest pain onset this evening with SOB , denies nausea or diaphoresis .

## 2013-01-02 NOTE — ED Provider Notes (Addendum)
I saw and evaluated the patient, reviewed the resident's note and I agree with the findings and plan.  Pt with h/o spontaneous PTX in the past, followed by CT surgery, last seen in clinic about 3 weeks ago, presents with similar spontaneous CP, SOB, pleurisy.  Decreased BS on left.  No tracheal deviation, BP is adequate.  In the ED BP improved to 100 systolic.  Saturations in upper 90's.  CXR shows 50% PTX on right.  BP stable, discuss with CT surgery to see pt, admission.   I reviewed ECG and agree with resident interpretation.   Gavin Pound. Oletta Lamas, MD 01/02/13 2209  Gavin Pound. Oletta Lamas, MD 01/02/13 2337  Gavin Pound. Oletta Lamas, MD 01/20/13 1057  Gavin Pound. Vivia Rosenburg, MD 01/20/13 1058
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
## 2013-01-02 NOTE — ED Notes (Signed)
MD at bedside. 
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
## 2013-01-02 NOTE — Procedures (Signed)
      [address]       [place] [address]             [phone]      Chest Tube Insertion Procedure Note  Indications:  Clinically significant Pneumothorax  Pre-operative Diagnosis: Pneumothorax  Post-operative Diagnosis: Pneumothorax  Procedure Details  Informed consent was obtained for the procedure, including sedation.  Risks of lung perforation, hemorrhage, arrhythmia, and adverse drug reaction were discussed.   After sterile skin prep, using standard technique, a 20 French tube was placed in the right lateral 6 rib space.  Findings: None  Estimated Blood Loss:  Minimal         Specimens:  None              Complications:  None; patient tolerated the procedure well.         Disposition: admitt for VATS         Condition: stable  Attending Attestation: I performed the procedure.

## 2013-01-02 NOTE — ED Notes (Signed)
Pt reports recent chest tube placement on right side 12/14/12

## 2013-01-02 NOTE — ED Notes (Signed)
Cardiothoracic surgeon at bedside 

## 2013-01-03 ENCOUNTER — Encounter (HOSPITAL_COMMUNITY)
Admission: EM | Disposition: A | Discharge status: Home / Self Care | Payer: Self-pay | Source: Home / Self Care | Attending: Cardiothoracic Surgery

## 2013-01-03 ENCOUNTER — Inpatient Hospital Stay (HOSPITAL_COMMUNITY): Payer: Self-pay

## 2013-01-03 ENCOUNTER — Encounter (HOSPITAL_COMMUNITY): Payer: Self-pay | Admitting: *Deleted

## 2013-01-03 ENCOUNTER — Inpatient Hospital Stay (HOSPITAL_COMMUNITY): Payer: Self-pay | Admitting: *Deleted

## 2013-01-03 DIAGNOSIS — J930 Spontaneous tension pneumothorax: Secondary | ICD-10-CM

## 2013-01-03 HISTORY — PX: VIDEO BRONCHOSCOPY: SHX5072

## 2013-01-03 HISTORY — PX: VIDEO ASSISTED THORACOSCOPY (VATS)/DECORTICATION: SHX6171

## 2013-01-03 LAB — COMPREHENSIVE METABOLIC PANEL
ALT: 29 U/L (ref 0–53)
AST: 24 U/L (ref 0–37)
Alkaline Phosphatase: 50 U/L (ref 39–117)
CO2: 24 mEq/L (ref 19–32)
Creatinine, Ser: 1.12 mg/dL (ref 0.50–1.35)
GFR calc Af Amer: >90 mL/min (ref >90)
GFR calc non Af Amer: 78 mL/min — ABNORMAL LOW (ref >90)
Glucose, Bld: 104 mg/dL — ABNORMAL HIGH (ref 70–99)
Potassium: 4 mEq/L (ref 3.5–5.1)
Sodium: 139 mEq/L (ref 135–145)
Total Protein: 7.2 g/dL (ref 6.0–8.3)

## 2013-01-03 LAB — CBC
HCT: 41.8 % (ref 39.0–52.0)
Hemoglobin: 14.4 g/dL (ref 13.0–17.0)
MCHC: 34.4 g/dL (ref 30.0–36.0)
Platelets: 308 10*3/uL (ref 150–400)
RBC: 4.75 MIL/uL (ref 4.22–5.81)
WBC: 10.2 10*3/uL (ref 4.0–10.5)

## 2013-01-03 LAB — URINALYSIS, ROUTINE W REFLEX MICROSCOPIC
Glucose, UA: NEGATIVE mg/dL
Hgb urine dipstick: NEGATIVE
Specific Gravity, Urine: 1.044 — ABNORMAL HIGH (ref 1.005–1.030)
pH: 6 (ref 5.0–8.0)

## 2013-01-03 LAB — ABO/RH: ABO/RH(D): A POS

## 2013-01-03 LAB — TYPE AND SCREEN
ABO/RH(D): A POS
Antibody Screen: NEGATIVE

## 2013-01-03 LAB — PROTIME-INR: Prothrombin Time: 12.8 seconds (ref 11.6–15.2)

## 2013-01-03 LAB — MRSA PCR SCREENING: MRSA by PCR: NEGATIVE

## 2013-01-03 SURGERY — BRONCHOSCOPY, VIDEO-ASSISTED
Anesthesia: General | Site: Chest | Laterality: Right | Wound class: Clean Contaminated

## 2013-01-03 MED ORDER — FENTANYL 10 MCG/ML IV SOLN
INTRAVENOUS | Status: DC
  Administered 2013-01-03: 17:00:00 via INTRAVENOUS
  Administered 2013-01-03: 255 ug via INTRAVENOUS
  Administered 2013-01-04: 05:00:00 via INTRAVENOUS
  Administered 2013-01-04: 85.98 ug via INTRAVENOUS
  Administered 2013-01-04: 233.7 ug via INTRAVENOUS
  Administered 2013-01-04: 45 ug via INTRAVENOUS
  Administered 2013-01-04: 175 ug via INTRAVENOUS
  Administered 2013-01-04: 107 ug via INTRAVENOUS
  Administered 2013-01-04: 135 ug via INTRAVENOUS
  Administered 2013-01-04: 148.9 ug via INTRAVENOUS
  Administered 2013-01-05: 255 ug via INTRAVENOUS
  Administered 2013-01-05: 225 ug via INTRAVENOUS
  Administered 2013-01-05: 06:00:00 via INTRAVENOUS
  Filled 2013-01-03 (×5): qty 50

## 2013-01-03 MED ORDER — LACTATED RINGERS IV SOLN
INTRAVENOUS | Status: DC | PRN
  Administered 2013-01-03: 13:00:00 via INTRAVENOUS

## 2013-01-03 MED ORDER — ROCURONIUM BROMIDE 100 MG/10ML IV SOLN
INTRAVENOUS | Status: DC | PRN
  Administered 2013-01-03: 30 mg via INTRAVENOUS

## 2013-01-03 MED ORDER — MIDAZOLAM HCL 2 MG/2ML IJ SOLN
INTRAMUSCULAR | Status: AC
  Filled 2013-01-03: qty 2

## 2013-01-03 MED ORDER — DEXTROSE-NACL 5-0.9 % IV SOLN
INTRAVENOUS | Status: DC
  Administered 2013-01-03: 20:00:00 via INTRAVENOUS
  Administered 2013-01-04: 50 mL/h via INTRAVENOUS
  Administered 2013-01-04: 05:00:00 via INTRAVENOUS

## 2013-01-03 MED ORDER — SUCCINYLCHOLINE CHLORIDE 20 MG/ML IJ SOLN
INTRAMUSCULAR | Status: DC | PRN
  Administered 2013-01-03: 120 mg via INTRAVENOUS

## 2013-01-03 MED ORDER — OXYCODONE HCL 5 MG PO TABS: 10.0000 mg | ORAL_TABLET | ORAL | Status: DC | PRN

## 2013-01-03 MED ORDER — LEVOTHYROXINE SODIUM 150 MCG PO TABS
150.0000 ug | ORAL_TABLET | Freq: Every day | ORAL | Status: DC
  Administered 2013-01-04 – 2013-01-06 (×3): 150 ug via ORAL
  Filled 2013-01-03 (×4): qty 1

## 2013-01-03 MED ORDER — VECURONIUM BROMIDE 10 MG IV SOLR
INTRAVENOUS | Status: DC | PRN
  Administered 2013-01-03: 4 mg via INTRAVENOUS
  Administered 2013-01-03: 1 mg via INTRAVENOUS

## 2013-01-03 MED ORDER — ACETAMINOPHEN 500 MG PO TABS
1000.0000 mg | ORAL_TABLET | Freq: Four times a day (QID) | ORAL | Status: AC
  Administered 2013-01-03 – 2013-01-04 (×4): 1000 mg via ORAL
  Filled 2013-01-03 (×4): qty 2

## 2013-01-03 MED ORDER — MIDAZOLAM HCL 5 MG/5ML IJ SOLN
INTRAMUSCULAR | Status: DC | PRN
  Administered 2013-01-03: 2 mg via INTRAVENOUS

## 2013-01-03 MED ORDER — GLYCOPYRROLATE 0.2 MG/ML IJ SOLN
INTRAMUSCULAR | Status: DC | PRN
  Administered 2013-01-03: 0.6 mg via INTRAVENOUS

## 2013-01-03 MED ORDER — OXYCODONE-ACETAMINOPHEN 5-325 MG PO TABS
1.0000 | ORAL_TABLET | ORAL | Status: DC | PRN
  Administered 2013-01-04 – 2013-01-06 (×7): 2 via ORAL
  Filled 2013-01-03: qty 2

## 2013-01-03 MED ORDER — POTASSIUM CHLORIDE 10 MEQ/50ML IV SOLN
10.0000 meq | Freq: Every day | INTRAVENOUS | Status: DC | PRN
  Administered 2013-01-05 (×3): 10 meq via INTRAVENOUS
  Filled 2013-01-03 (×2): qty 50

## 2013-01-03 MED ORDER — OXYCODONE HCL 5 MG PO TABS
5.0000 mg | ORAL_TABLET | ORAL | Status: AC | PRN
  Administered 2013-01-03: 5 mg via ORAL
  Administered 2013-01-04: 10 mg via ORAL
  Administered 2013-01-04: 5 mg via ORAL
  Administered 2013-01-04: 10 mg via ORAL
  Filled 2013-01-03: qty 2
  Filled 2013-01-03: qty 1
  Filled 2013-01-03 (×2): qty 2

## 2013-01-03 MED ORDER — FENTANYL CITRATE 0.05 MG/ML IJ SOLN
INTRAMUSCULAR | Status: AC
  Filled 2013-01-03: qty 2

## 2013-01-03 MED ORDER — LIDOCAINE HCL (CARDIAC) 20 MG/ML IV SOLN
INTRAVENOUS | Status: DC | PRN
  Administered 2013-01-03: 100 mg via INTRAVENOUS

## 2013-01-03 MED ORDER — SODIUM CHLORIDE 0.9 % IJ SOLN: 9.0000 mL | INTRAMUSCULAR | Status: DC | PRN

## 2013-01-03 MED ORDER — DIPHENHYDRAMINE HCL 12.5 MG/5ML PO ELIX
12.5000 mg | ORAL_SOLUTION | Freq: Four times a day (QID) | ORAL | Status: DC | PRN
  Filled 2013-01-03: qty 5

## 2013-01-03 MED ORDER — HYDROMORPHONE HCL PF 1 MG/ML IJ SOLN
INTRAMUSCULAR | Status: AC
  Filled 2013-01-03: qty 1

## 2013-01-03 MED ORDER — ACETAMINOPHEN 160 MG/5ML PO SOLN
1000.0000 mg | Freq: Four times a day (QID) | ORAL | Status: AC
  Filled 2013-01-03 (×4): qty 40

## 2013-01-03 MED ORDER — DEXTROSE 5 % IV SOLN
1.5000 g | INTRAVENOUS | Status: AC
  Administered 2013-01-03: 1.5 g via INTRAVENOUS
  Filled 2013-01-03: qty 1.5

## 2013-01-03 MED ORDER — 0.9 % SODIUM CHLORIDE (POUR BTL) OPTIME
TOPICAL | Status: DC | PRN
  Administered 2013-01-03: 2000 mL

## 2013-01-03 MED ORDER — FENTANYL CITRATE 0.05 MG/ML IJ SOLN
INTRAMUSCULAR | Status: AC
  Administered 2013-01-03: 50 ug
  Administered 2013-01-03 (×2): 50 ug via INTRAVENOUS
  Administered 2013-01-03: 50 ug
  Filled 2013-01-03: qty 2

## 2013-01-03 MED ORDER — ONDANSETRON HCL 4 MG/2ML IJ SOLN: 4.0000 mg | Freq: Four times a day (QID) | INTRAMUSCULAR | Status: DC | PRN

## 2013-01-03 MED ORDER — BISACODYL 5 MG PO TBEC
10.0000 mg | DELAYED_RELEASE_TABLET | Freq: Every day | ORAL | Status: DC
  Administered 2013-01-04 – 2013-01-05 (×2): 10 mg via ORAL
  Filled 2013-01-03 (×2): qty 2

## 2013-01-03 MED ORDER — NALOXONE HCL 0.4 MG/ML IJ SOLN: 0.4000 mg | INTRAMUSCULAR | Status: DC | PRN

## 2013-01-03 MED ORDER — DIPHENHYDRAMINE HCL 50 MG/ML IJ SOLN: 12.5000 mg | Freq: Four times a day (QID) | INTRAMUSCULAR | Status: DC | PRN

## 2013-01-03 MED ORDER — TRAMADOL HCL 50 MG PO TABS
50.0000 mg | ORAL_TABLET | Freq: Four times a day (QID) | ORAL | Status: DC | PRN
  Administered 2013-01-05: 100 mg via ORAL
  Filled 2013-01-03: qty 2

## 2013-01-03 MED ORDER — HYDROMORPHONE HCL PF 1 MG/ML IJ SOLN
INTRAMUSCULAR | Status: AC
  Administered 2013-01-03: 0.5 mg via INTRAVENOUS
  Filled 2013-01-03: qty 1

## 2013-01-03 MED ORDER — FENTANYL CITRATE 0.05 MG/ML IJ SOLN
INTRAMUSCULAR | Status: DC | PRN
  Administered 2013-01-03: 200 ug via INTRAVENOUS
  Administered 2013-01-03: 300 ug via INTRAVENOUS

## 2013-01-03 MED ORDER — PROPOFOL 10 MG/ML IV BOLUS
INTRAVENOUS | Status: DC | PRN
  Administered 2013-01-03: 200 mg via INTRAVENOUS
  Administered 2013-01-03: 100 mg via INTRAVENOUS

## 2013-01-03 MED ORDER — DEXAMETHASONE SODIUM PHOSPHATE 10 MG/ML IJ SOLN
INTRAMUSCULAR | Status: DC | PRN
  Administered 2013-01-03: 10 mg via INTRAVENOUS

## 2013-01-03 MED ORDER — HYDROMORPHONE HCL PF 1 MG/ML IJ SOLN
0.2500 mg | INTRAMUSCULAR | Status: DC | PRN
  Administered 2013-01-03 (×3): 0.5 mg via INTRAVENOUS

## 2013-01-03 MED ORDER — OXYCODONE HCL 5 MG PO TABS
ORAL_TABLET | ORAL | Status: AC
  Administered 2013-01-03: 5 mg
  Filled 2013-01-03: qty 1

## 2013-01-03 MED ORDER — NEOSTIGMINE METHYLSULFATE 1 MG/ML IJ SOLN
INTRAMUSCULAR | Status: DC | PRN
  Administered 2013-01-03: 4 mg via INTRAVENOUS

## 2013-01-03 MED ORDER — ONDANSETRON HCL 4 MG/2ML IJ SOLN
INTRAMUSCULAR | Status: DC | PRN
  Administered 2013-01-03: 4 mg via INTRAMUSCULAR

## 2013-01-03 MED ORDER — OXYCODONE-ACETAMINOPHEN 5-325 MG PO TABS
1.0000 | ORAL_TABLET | ORAL | Status: DC | PRN
  Filled 2013-01-03 (×6): qty 2

## 2013-01-03 MED ORDER — OXYCODONE HCL 5 MG PO TABS
5.0000 mg | ORAL_TABLET | ORAL | Status: DC | PRN
  Administered 2013-01-03 (×2): 10 mg via ORAL
  Administered 2013-01-03: 5 mg via ORAL
  Filled 2013-01-03 (×2): qty 2
  Filled 2013-01-03: qty 1

## 2013-01-03 MED ORDER — SENNOSIDES-DOCUSATE SODIUM 8.6-50 MG PO TABS
1.0000 | ORAL_TABLET | Freq: Every evening | ORAL | Status: DC | PRN
  Filled 2013-01-03: qty 1

## 2013-01-03 MED ORDER — DEXTROSE 5 % IV SOLN
1.5000 g | Freq: Two times a day (BID) | INTRAVENOUS | Status: AC
  Administered 2013-01-03 – 2013-01-04 (×2): 1.5 g via INTRAVENOUS
  Filled 2013-01-03 (×3): qty 1.5

## 2013-01-03 MED ORDER — ONDANSETRON HCL 4 MG/2ML IJ SOLN: 4.0000 mg | Freq: Once | INTRAMUSCULAR | Status: DC | PRN

## 2013-01-03 SURGICAL SUPPLY — 87 items
ADH SKN CLS APL DERMABOND .7 (GAUZE/BANDAGES/DRESSINGS) ×2
APL SRG 22X2 LUM MLBL SLNT (VASCULAR PRODUCTS)
APL SRG 7X2 LUM MLBL SLNT (VASCULAR PRODUCTS)
APPLICATOR TIP COSEAL (VASCULAR PRODUCTS) IMPLANT
APPLICATOR TIP EXT COSEAL (VASCULAR PRODUCTS) IMPLANT
BLADE SURG 11 STRL SS (BLADE) IMPLANT
BRUSH CYTOL CELLEBRITY 1.5X140 (MISCELLANEOUS) IMPLANT
CANISTER SUCTION 2500CC (MISCELLANEOUS) ×3 IMPLANT
CATH KIT ON Q 5IN SLV (PAIN MANAGEMENT) IMPLANT
CATH THORACIC 28FR (CATHETERS) IMPLANT
CATH THORACIC 36FR (CATHETERS) IMPLANT
CATH THORACIC 36FR RT ANG (CATHETERS) IMPLANT
CLEANER TIP ELECTROSURG 2X2 (MISCELLANEOUS) ×3 IMPLANT
CLIP TI MEDIUM 6 (CLIP) IMPLANT
CONN ST 1/4X3/8  BEN (MISCELLANEOUS) ×2
CONN ST 1/4X3/8 BEN (MISCELLANEOUS) IMPLANT
CONN Y 3/8X3/8X3/8  BEN (MISCELLANEOUS) ×1
CONN Y 3/8X3/8X3/8 BEN (MISCELLANEOUS) IMPLANT
CONT SPEC 4OZ CLIKSEAL STRL BL (MISCELLANEOUS) ×7 IMPLANT
COVER SURGICAL LIGHT HANDLE (MISCELLANEOUS) ×3 IMPLANT
COVER TABLE BACK 60X90 (DRAPES) ×3 IMPLANT
DERMABOND ADVANCED (GAUZE/BANDAGES/DRESSINGS) ×1
DERMABOND ADVANCED .7 DNX12 (GAUZE/BANDAGES/DRESSINGS) IMPLANT
DRAIN CHANNEL 28F RND 3/8 FF (WOUND CARE) IMPLANT
DRAPE LAPAROSCOPIC ABDOMINAL (DRAPES) ×3 IMPLANT
DRAPE WARM FLUID 44X44 (DRAPE) ×3 IMPLANT
DRILL BIT 7/64X5 (BIT) IMPLANT
DRSG AQUACEL AG ADV 3.5X14 (GAUZE/BANDAGES/DRESSINGS) ×3 IMPLANT
ELECT BLADE 4.0 EZ CLEAN MEGAD (MISCELLANEOUS) ×3
ELECT REM PT RETURN 9FT ADLT (ELECTROSURGICAL) ×3
ELECTRODE BLDE 4.0 EZ CLN MEGD (MISCELLANEOUS) ×2 IMPLANT
ELECTRODE REM PT RTRN 9FT ADLT (ELECTROSURGICAL) ×2 IMPLANT
FORCEPS BIOP RJ4 1.8 (CUTTING FORCEPS) IMPLANT
GLOVE BIO SURGEON STRL SZ 6 (GLOVE) ×2 IMPLANT
GLOVE BIO SURGEON STRL SZ 6.5 (GLOVE) ×6 IMPLANT
GLOVE BIO SURGEON STRL SZ7.5 (GLOVE) ×2 IMPLANT
GOWN STRL NON-REIN LRG LVL3 (GOWN DISPOSABLE) ×12 IMPLANT
HANDLE STAPLE ENDO GIA SHORT (STAPLE) ×1
KIT BASIN OR (CUSTOM PROCEDURE TRAY) ×3 IMPLANT
KIT ROOM TURNOVER OR (KITS) ×3 IMPLANT
KIT SUCTION CATH 14FR (SUCTIONS) ×3 IMPLANT
MARKER SKIN DUAL TIP RULER LAB (MISCELLANEOUS) ×3 IMPLANT
NDL BIOPSY TRANSBRONCH 21G (NEEDLE) IMPLANT
NEEDLE BIOPSY TRANSBRONCH 21G (NEEDLE) IMPLANT
NS IRRIG 1000ML POUR BTL (IV SOLUTION) ×6 IMPLANT
OIL SILICONE PENTAX (PARTS (SERVICE/REPAIRS)) ×3 IMPLANT
PACK CHEST (CUSTOM PROCEDURE TRAY) ×3 IMPLANT
PAD ARMBOARD 7.5X6 YLW CONV (MISCELLANEOUS) ×6 IMPLANT
RELOAD EGIA 45 MED/THCK PURPLE (STAPLE) ×1 IMPLANT
RELOAD EGIA 60 MED/THCK PURPLE (STAPLE) ×3 IMPLANT
RELOAD STAPLE 60 MED/THCK ART (STAPLE) IMPLANT
SCISSORS LAP 5X35 DISP (ENDOMECHANICALS) IMPLANT
SEALANT PROGEL (MISCELLANEOUS) IMPLANT
SEALANT SURG COSEAL 4ML (VASCULAR PRODUCTS) IMPLANT
SEALANT SURG COSEAL 8ML (VASCULAR PRODUCTS) IMPLANT
SOLUTION ANTI FOG 6CC (MISCELLANEOUS) ×3 IMPLANT
SPONGE GAUZE 4X4 12PLY (GAUZE/BANDAGES/DRESSINGS) ×3 IMPLANT
STAPLER ENDO GIA 12 SHRT THIN (STAPLE) IMPLANT
STAPLER ENDO GIA 12MM SHORT (STAPLE) ×2 IMPLANT
SUT PROLENE 3 0 SH DA (SUTURE) IMPLANT
SUT PROLENE 4 0 RB 1 (SUTURE)
SUT PROLENE 4-0 RB1 .5 CRCL 36 (SUTURE) IMPLANT
SUT SILK  1 MH (SUTURE) ×4
SUT SILK 1 MH (SUTURE) ×4 IMPLANT
SUT SILK 2 0SH CR/8 30 (SUTURE) IMPLANT
SUT SILK 3 0SH CR/8 30 (SUTURE) IMPLANT
SUT VIC AB 1 CTX 18 (SUTURE) IMPLANT
SUT VIC AB 1 CTX 36 (SUTURE)
SUT VIC AB 1 CTX36XBRD ANBCTR (SUTURE) IMPLANT
SUT VIC AB 2-0 CTX 36 (SUTURE) IMPLANT
SUT VIC AB 2-0 UR6 27 (SUTURE) ×1 IMPLANT
SUT VIC AB 3-0 X1 27 (SUTURE) ×2 IMPLANT
SUT VICRYL 2 TP 1 (SUTURE) IMPLANT
SWAB COLLECTION DEVICE MRSA (MISCELLANEOUS) IMPLANT
SYR 20ML ECCENTRIC (SYRINGE) ×3 IMPLANT
SYSTEM SAHARA CHEST DRAIN ATS (WOUND CARE) ×3 IMPLANT
TAPE CLOTH 4X10 WHT NS (GAUZE/BANDAGES/DRESSINGS) ×3 IMPLANT
TAPE CLOTH SURG 4X10 WHT LF (GAUZE/BANDAGES/DRESSINGS) ×1 IMPLANT
TIP APPLICATOR SPRAY EXTEND 16 (VASCULAR PRODUCTS) IMPLANT
TOWEL OR 17X24 6PK STRL BLUE (TOWEL DISPOSABLE) ×6 IMPLANT
TOWEL OR 17X26 10 PK STRL BLUE (TOWEL DISPOSABLE) ×6 IMPLANT
TRAP SPECIMEN MUCOUS 40CC (MISCELLANEOUS) ×3 IMPLANT
TRAY FOLEY CATH 14FRSI W/METER (CATHETERS) ×3 IMPLANT
TUBE ANAEROBIC SPECIMEN COL (MISCELLANEOUS) IMPLANT
TUBE CONNECTING 12X1/4 (SUCTIONS) ×6 IMPLANT
TUNNELER SHEATH ON-Q 11GX8 DSP (PAIN MANAGEMENT) IMPLANT
WATER STERILE IRR 1000ML POUR (IV SOLUTION) ×6 IMPLANT

## 2013-01-03 NOTE — Transfer of Care (Signed)
Immediate Anesthesia Transfer of Care Note  Patient: Nicholas Mccarty  Procedure(s) Performed: Procedure(s): VIDEO BRONCHOSCOPY (N/A) VIDEO ASSISTED THORACOSCOPY (VATS)/DECORTICATION (Right)  Patient Location: PACU  Anesthesia Type:General  Level of Consciousness: sedated, patient cooperative and responds to stimulation  Airway & Oxygen Therapy: Patient Spontanous Breathing and Patient connected to face mask oxygen  Post-op Assessment: Report given to PACU RN, Post -op Vital signs reviewed and stable and Patient moving all extremities  Post vital signs: Reviewed and stable  Complications: No apparent anesthesia complications
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
## 2013-01-03 NOTE — Anesthesia Procedure Notes (Signed)
Procedure Name: Intubation Date/Time: 01/03/2013 2:42 PM Performed by: Ferol Luz L Pre-anesthesia Checklist: Patient identified, Emergency Drugs available, Suction available, Patient being monitored and Timeout performed Patient Re-evaluated:Patient Re-evaluated prior to inductionOxygen Delivery Method: Circle system utilized Preoxygenation: Pre-oxygenation with 100% oxygen Intubation Type: Inhalational induction with existing ETT and Cricoid Pressure applied Laryngoscope Size: Mac and 4 Grade View: Grade I Endobronchial tube: Left, Double lumen EBT, EBT position confirmed by fiberoptic bronchoscope and EBT position confirmed by auscultation and 41 Fr Number of attempts: 1 Airway Equipment and Method: Video-laryngoscopy and Fiberoptic brochoscope Placement Confirmation: ETT inserted through vocal cords under direct vision,  positive ETCO2 and breath sounds checked- equal and bilateral Tube secured with: Tape Dental Injury: Teeth and Oropharynx as per pre-operative assessment  Comments: McGrath video scope used with good visualization

## 2013-01-03 NOTE — Brief Op Note (Addendum)
      [address]       [place] [address]             [phone]     01/02/2013 - 01/03/2013  3:57 PM  PATIENT:  Nicholas Mccarty  42 y.o. male  PRE-OPERATIVE DIAGNOSIS:  Pneumothorax ( recurrent right spontaneous)  POST-OPERATIVE DIAGNOSIS:  Same, emphysematous blebs  PROCEDURE:  Procedure(s): VIDEO BRONCHOSCOPY VIDEO ASSISTED THORACOSCOPY (VATS) with stapling of RUL blebs, mechanical pleurodesis  SURGEON:  Surgeon(s): Delight Ovens, MD  PHYSICIAN ASSISTANT: Gershon Crane PA-C  ANESTHESIA:   general  SPECIMEN:  Source of Specimen:  RUL edge resect X 2  DISPOSITION OF SPECIMEN:  Pathology  DRAINS: 2 Chest Tube(s) in the right hemithorax   PATIENT CONDITION:  PACU - hemodynamically stable.  PRE-OPERATIVE WEIGHT: 108kg  Complications: no known

## 2013-01-03 NOTE — Care Management Note (Unsigned)
    Page 1 of 1   01/03/2013     10:44:55 AM   CARE MANAGEMENT NOTE 01/03/2013  Patient:  Nicholas Mccarty, Nicholas Mccarty   Account Number:  [id number]  Date Initiated:  01/03/2013  Documentation initiated by:  Traylon Schimming  Subjective/Objective Assessment:   PT ADM ON 01/02/13 WITH RT RECURRENT PTX.  PLANNING RT VATS PROCEDURE TODAY.  PTA, PT INDEPENDENT, LIVES WITH GIRLFRIEND.     Action/Plan:   WILL FOLLLOW FOR DISCHARGE NEEDS AS PT PROGRESSES.   Anticipated DC Date:  01/07/2013   Anticipated DC Plan:  HOME/SELF CARE      DC Planning Services  CM consult      Choice offered to / List presented to:             Status of service:  In process, will continue to follow Medicare Important Message given?   (If response is "NO", the following Medicare IM given date fields will be blank) Date Medicare IM given:   Date Additional Medicare IM given:    Discharge Disposition:    Per UR Regulation:  Reviewed for med. necessity/level of care/duration of stay  If discussed at Long Length of Stay Meetings, dates discussed:    Comments:

## 2013-01-03 NOTE — Progress Notes (Addendum)
[address]       [place] [address]             [phone]           Subjective: Having pain from chest tube  Objective  Telemetry sinus rhythm  Temp:  [98.6 F (37 C)-99.4 F (37.4 C)] 98.6 F (37 C) (10/21 0512) Pulse Rate:  [78-118] 81 (10/21 0512) Resp:  [14-25] 20 (10/21 0512) BP: (88-123)/(59-87) 123/80 mmHg (10/21 0512) SpO2:  [95 %-100 %] 98 % (10/21 0512) Weight:  [235 lb (106.595 kg)-238 lb 8 oz (108.183 kg)] 238 lb 8 oz (108.183 kg) (10/21 0052)   Intake/Output Summary (Last 24 hours) at 01/03/13 0741 Last data filed at 01/03/13 2130  Gross per 24 hour  Intake    475 ml  Output      0 ml  Net    475 ml       General appearance: alert, cooperative and no distress Heart: regular rate and rhythm Lungs: clear to auscultation bilaterally Abdomen: benign Extremities: warm perfused Wound: dressing CDI, chest tube + air leak  Lab Results:  Recent Labs  01/02/13 2109  NA 144  K 4.0  CL 106  CO2 30  GLUCOSE 90  BUN 13  CREATININE 1.32  CALCIUM 9.1   No results found for this basename: AST, ALT, ALKPHOS, BILITOT, PROT, ALBUMIN,  in the last 72 hours No results found for this basename: LIPASE, AMYLASE,  in the last 72 hours  Recent Labs  01/02/13 2109  WBC 7.2  HGB 14.1  HCT 40.4  MCV 86.1  PLT 287   No results found for this basename: CKTOTAL, CKMB, TROPONINI,  in the last 72 hours No components found with this basename: POCBNP,  No results found for this basename: DDIMER,  in the last 72 hours No results found for this basename: HGBA1C,  in the last 72 hours No results found for this basename: CHOL, HDL, LDLCALC, TRIG, CHOLHDL,  in the last 72 hours No results found for this basename: TSH, T4TOTAL, FREET3, T3FREE, THYROIDAB,  in the last 72 hours No results found for this basename: VITAMINB12, FOLATE, FERRITIN, TIBC, IRON, RETICCTPCT,  in the last 72 hours  Medications: Scheduled . docusate sodium  100 mg Oral  BID     Radiology/Studies:  Dg Chest 2 View  01/02/2013   CLINICAL DATA:  Right chest pain. Recent pneumothorax  EXAM: CHEST  2 VIEW  COMPARISON:  12/28/2012  FINDINGS: Large right pneumothorax approximately 50%.  No pleural effusion.  Left lung is clear. Negative for pneumonia or heart failure.  IMPRESSION: 50% right pneumothorax.  Critical Value/emergent results were called by telephone at the time of interpretation on 01/02/2013 at 9:07 PM to Dr.MICHAEL [facility] , who verbally acknowledged these results.   Electronically Signed   By: Marlan Palau M.D.   On: 01/02/2013 21:07   Ct Chest W Contrast  01/03/2013   *RADIOLOGY REPORT*  Clinical Data: History of thyroid cancer, right-sided chest pain.  CT CHEST WITH CONTRAST  Technique:  Multidetector CT imaging of the chest was performed following the standard protocol during bolus administration of intravenous contrast.  Contrast: OMNIPAQUE IOHEXOL 300 MG/ML  SOLN  Comparison: Chest radiograph January 02, 2013 at 2327 hours.  Findings: Large right pneumothorax, with chest tube in place, side port within the chest wall. Moderate to severe right lung collapse, with partially characterized apical blebs.  Pneumothorax extends 11 cm from  anterior chest wall, with mild right to left mediastinal shift.  Tracheobronchial tree remains patent.  The left lung is clear without pulmonary nodules, masses, pleural effusions or focal consolidations.  No right pleural effusion.  Heart and pericardium are unremarkable.  Thoracic aorta is normal in course and caliber and unremarkable.  No lymphadenopathy by CT size criteria.  The thoracic esophagus is unremarkable.  Included view of the abdomen is unremarkable. Status post apparent thyroidectomy.  IMPRESSION: Large right pneumothorax with well seated chest tube, mild mediastinal shift to the left.  Small right apical blebs difficult to further evaluate considering degree of underlying right lung collapse. It is unclear  whether the chest tube is not well functioning or, if there is a component of noncompliant lung.  No rib fracture.   Original Report Authenticated By: Awilda Metro   Portable Chest 1 View  01/02/2013   CLINICAL DATA:  Chest tube placement  EXAM: PORTABLE CHEST - 1 VIEW  COMPARISON:  None.  FINDINGS: Right chest tube has been placed in good position. Large right pneumothorax persists, increased from earlier today. No significant pleural effusion. Left lung is clear.  IMPRESSION: Right chest tube placed. Increase in pneumothorax since earlier today.   Electronically Signed   By: Marlan Palau M.D.   On: 01/02/2013 23:37    INR: Will add last result for INR, ABG once components are confirmed Will add last 4 CBG results once components are confirmed  Assessment/Plan: S/P right chest tube, tube pulled out 2 inches.rush of air noted For VATS today   LOS: 1 day    GOLD,WAYNE E 10/21/20147:41 AM  I have recommended to patient with recurrent PTX that we proceed with right VATS and bronchoscopy to decrease risk of recurrent PTX. The goals risks and alternatives of the planned surgical procedure Bronchoscopy and RT VATS  have been discussed with the patient in detail. The risks of the procedure including death, infection, stroke, myocardial infarction, bleeding, blood transfusion have all been discussed specifically.  I have quoted Mauri Reading a 1 % of perioperative mortality and a complication rate as high as 10%. The patient's questions have been answered.Rylin Seavey Ravi is willing  to proceed with the planned procedure.   Delight Ovens MD      [address] [place] [place] [address] Office [phone]   Beeper [phone]  01/03/2013 1:10 PM

## 2013-01-03 NOTE — Progress Notes (Signed)
Full dose Fentanyl PCA verified by P. Shawnie Dapper RN

## 2013-01-04 ENCOUNTER — Inpatient Hospital Stay (HOSPITAL_COMMUNITY): Payer: Self-pay

## 2013-01-04 LAB — BLOOD GAS, ARTERIAL
Acid-Base Excess: 1.5 mmol/L (ref 0.0–2.0)
O2 Saturation: 95.8 %
Patient temperature: 98.6
TCO2: 27.2 mmol/L (ref 0–100)
pH, Arterial: 7.392 (ref 7.350–7.450)

## 2013-01-04 LAB — BASIC METABOLIC PANEL
BUN: 13 mg/dL (ref 6–23)
CO2: 24 mEq/L (ref 19–32)
Calcium: 9.2 mg/dL (ref 8.4–10.5)
Creatinine, Ser: 1.06 mg/dL (ref 0.50–1.35)
GFR calc non Af Amer: 84 mL/min — ABNORMAL LOW (ref >90)
Glucose, Bld: 144 mg/dL — ABNORMAL HIGH (ref 70–99)

## 2013-01-04 LAB — CBC
MCHC: 34.3 g/dL (ref 30.0–36.0)
MCV: 87.2 fL (ref 78.0–100.0)
Platelets: 300 10*3/uL (ref 150–400)
RDW: 13.3 % (ref 11.5–15.5)
WBC: 11.4 10*3/uL — ABNORMAL HIGH (ref 4.0–10.5)

## 2013-01-04 MED ORDER — FAMOTIDINE 20 MG PO TABS
20.0000 mg | ORAL_TABLET | Freq: Two times a day (BID) | ORAL | Status: DC
  Administered 2013-01-04 – 2013-01-06 (×4): 20 mg via ORAL
  Filled 2013-01-04 (×5): qty 1

## 2013-01-04 MED ORDER — ENOXAPARIN SODIUM 30 MG/0.3ML ~~LOC~~ SOLN
30.0000 mg | SUBCUTANEOUS | Status: DC
  Administered 2013-01-04 – 2013-01-05 (×2): 30 mg via SUBCUTANEOUS
  Filled 2013-01-04 (×3): qty 0.3

## 2013-01-04 NOTE — Progress Notes (Addendum)
[facility] DAILY ICU PROGRESS NOTE                   [address]            [place] [address]          [phone]   1 Day Post-Op Procedure(s) (LRB): VIDEO BRONCHOSCOPY (N/A) VIDEO ASSISTED THORACOSCOPY (VATS)/DECORTICATION (Right)  Total Length of Stay:  LOS: 2 days   Subjective: Feels ok, some discomfort  Objective: Vital signs in last 24 hours: Temp:  [96.2 F (35.7 C)-98.2 F (36.8 C)] 98.2 F (36.8 C) (10/22 0335) Pulse Rate:  [63-90] 89 (10/22 0335) Cardiac Rhythm:  [-] Normal sinus rhythm (10/22 0335) Resp:  [12-22] 22 (10/22 0335) BP: (126-150)/(76-97) 137/79 mmHg (10/22 0335) SpO2:  [87 %-99 %] 98 % (10/22 0600) Arterial Line BP: (141-168)/(68-88) 154/80 mmHg (10/22 0335) Weight:  [247 lb 12.8 oz (112.4 kg)] 247 lb 12.8 oz (112.4 kg) (10/21 1824)  Filed Weights   01/02/13 2013 01/03/13 0052 01/03/13 1824  Weight: 235 lb (106.595 kg) 238 lb 8 oz (108.183 kg) 247 lb 12.8 oz (112.4 kg)    Weight change: 12 lb 12.8 oz (5.805 kg)   Hemodynamic parameters for last 24 hours:    Intake/Output from previous day: 10/21 0701 - 10/22 0700 In: 2600 [P.O.:50; I.V.:2500; IV Piggyback:50] Out: 1697 [Urine:1525; Chest Tube:172]  Intake/Output this shift:    Current Meds: Scheduled Meds: . acetaminophen  1,000 mg Oral Q6H   Or  . acetaminophen (TYLENOL) oral liquid 160 mg/5 mL  1,000 mg Oral Q6H  . bisacodyl  10 mg Oral Daily  . cefUROXime (ZINACEF)  IV  1.5 g Intravenous Q12H  . fentaNYL   Intravenous Q4H  . levothyroxine  150 mcg Oral QAC breakfast   Continuous Infusions: . dextrose 5 % and 0.9% NaCl 100 mL/hr at 01/04/13 0431   PRN Meds:.diphenhydrAMINE, diphenhydrAMINE, naloxone, ondansetron (ZOFRAN) IV, ondansetron (ZOFRAN) IV, oxyCODONE, oxyCODONE-acetaminophen, oxyCODONE-acetaminophen, potassium chloride, senna-docusate, sodium chloride, traMADol  General appearance: alert, cooperative and no distress Heart: regular rate and rhythm Lungs:  clear to auscultation bilaterally Abdomen: benign Extremities: warm/perfused Wound: dressings CDI  Lab Results: CBC: Recent Labs  01/03/13 1107 01/04/13 0450  WBC 10.2 11.4*  HGB 14.4 14.0  HCT 41.8 40.8  PLT 308 300   BMET:  Recent Labs  01/03/13 1107 01/04/13 0450  NA 139 139  K 4.0 4.1  CL 103 102  CO2 24 24  GLUCOSE 104* 144*  BUN 13 13  CREATININE 1.12 1.06  CALCIUM 9.2 9.2    PT/INR:  Recent Labs  01/03/13 1107  LABPROT 12.8  INR 0.98   ABG    Component Value Date/Time   PHART 7.392 01/04/2013 0450   PCO2ART 43.5 01/04/2013 0450   PO2ART 75.6* 01/04/2013 0450   HCO3 25.9* 01/04/2013 0450   TCO2 27.2 01/04/2013 0450   O2SAT 95.8 01/04/2013 0450    Radiology: Dg Chest 2 View   Dg Chest Port 1 View  01/04/2013   CLINICAL DATA:  The check chest tube placement  EXAM: PORTABLE CHEST - 1 VIEW  COMPARISON:  01/03/2013  FINDINGS: The cardiac shadow is stable. A right-sided central venous line is seen. Two chest tubes are noted on the right. No pneumothorax is seen. The left lung demonstrates some very mild atelectasis in the base.  IMPRESSION: No evidence of recurrent pneumothorax.  Mild left basilar atelectasis.   Electronically Signed   By: Eulah Pont.D.  On: 01/04/2013 07:32   Dg Chest Portable 1 View  01/03/2013   CLINICAL DATA:  Postop VATS. Central line placement.  EXAM: PORTABLE CHEST - 1 VIEW  COMPARISON:  CT 01/02/2013. Radiographs earlier today.  FINDINGS: 1659 hr. Two right-sided chest tubes are now in place. There is near-complete evacuation of the previously demonstrated right pneumothorax with a minimal residual apical component. Right IJ central venous catheter projects to the level of the lower SVC. There are low lung volumes with bibasilar and perihilar atelectasis. The heart size and mediastinal contours appear stable for the degree of inspiration.  IMPRESSION: Central line and 2nd right chest tube placement as described with near-complete  evacuation of right-sided pneumothorax. Increased perihilar and lower lobe atelectasis bilaterally.   Electronically Signed   By: Roxy Horseman M.D.   On: 01/03/2013 17:07   Portable Chest 1 View  01/02/2013   CLINICAL DATA:  Chest tube placement  EXAM: PORTABLE CHEST - 1 VIEW  COMPARISON:  None.  FINDINGS: Right chest tube has been placed in good position. Large right pneumothorax persists, increased from earlier today. No significant pleural effusion. Left lung is clear.  IMPRESSION: Right chest tube placed. Increase in pneumothorax since earlier today.   Electronically Signed   By: Marlan Palau M.D.   On: 01/02/2013 23:37   Chest tube - no air leak CXR- possible  small apical pntx   Assessment/Plan: S/P Procedure(s) (LRB): VIDEO BRONCHOSCOPY (N/A) VIDEO ASSISTED THORACOSCOPY (VATS)/DECORTICATION (Right) 1 doing well 2 d/c aline 3 d/c foley 4 poss d/c one chest tube 5 decrease IVF, advance diet 6 push routine rehab/pulm toilet 7 labs- ok 8 cont pca   Nicholas Mccarty,Nicholas Mccarty 01/04/2013 7:32 AM Ct to water seal, d/c one  CT  Tube today  I have seen and examined Nicholas Mccarty and agree with the above assessment  and plan.  Delight Ovens MD Beeper [phone] Office [phone] 01/04/2013 9:19 AM

## 2013-01-04 NOTE — Progress Notes (Signed)
Chest Tube dc'd intact. Patient moving  when tube dc'd and yelling out. Complaints of some sob, oxygen sats 93%. Placed on oxygen.   Sitting up in high fowlers.  1100 sitting up in bed no complaints, laughing and talking with family. Lungs decreased in bases.  Continue to watch.

## 2013-01-04 NOTE — Anesthesia Postprocedure Evaluation (Signed)
  Anesthesia Post-op Note  Patient: Nicholas Mccarty  Procedure(s) Performed: Procedure(s): VIDEO BRONCHOSCOPY (N/A) VIDEO ASSISTED THORACOSCOPY (VATS)/DECORTICATION (Right)  Patient Location: SICU  Anesthesia Type:General  Level of Consciousness: awake, alert  and oriented  Airway and Oxygen Therapy: Patient Spontanous Breathing  Post-op Pain: mild  Post-op Assessment: Post-op Vital signs reviewed, Patient's Cardiovascular Status Stable and Respiratory Function Stable  Post-op Vital Signs: Reviewed and stable  Complications: No apparent anesthesia complications

## 2013-01-05 ENCOUNTER — Inpatient Hospital Stay (HOSPITAL_COMMUNITY): Payer: Self-pay

## 2013-01-05 ENCOUNTER — Encounter (HOSPITAL_COMMUNITY): Payer: Self-pay | Admitting: Surgical

## 2013-01-05 LAB — COMPREHENSIVE METABOLIC PANEL
BUN: 13 mg/dL (ref 6–23)
CO2: 30 mEq/L (ref 19–32)
Calcium: 8.5 mg/dL (ref 8.4–10.5)
Chloride: 101 mEq/L (ref 96–112)
Creatinine, Ser: 1.23 mg/dL (ref 0.50–1.35)
GFR calc Af Amer: 81 mL/min — ABNORMAL LOW (ref >90)
GFR calc non Af Amer: 70 mL/min — ABNORMAL LOW (ref >90)
Glucose, Bld: 100 mg/dL — ABNORMAL HIGH (ref 70–99)
Total Bilirubin: 0.4 mg/dL (ref 0.3–1.2)
Total Protein: 6.9 g/dL (ref 6.0–8.3)

## 2013-01-05 LAB — CBC
HCT: 39.7 % (ref 39.0–52.0)
MCH: 30.7 pg (ref 26.0–34.0)
MCV: 89 fL (ref 78.0–100.0)
RBC: 4.46 MIL/uL (ref 4.22–5.81)
WBC: 12.5 10*3/uL — ABNORMAL HIGH (ref 4.0–10.5)

## 2013-01-05 MED ORDER — DIPHENHYDRAMINE HCL 25 MG PO CAPS
25.0000 mg | ORAL_CAPSULE | Freq: Three times a day (TID) | ORAL | Status: DC | PRN
  Administered 2013-01-05: 25 mg via ORAL
  Filled 2013-01-05: qty 1
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
## 2013-01-05 NOTE — Progress Notes (Addendum)
TCTS DAILY ICU PROGRESS NOTE                   [address]            [place] [address]          [phone]   2 Days Post-Op Procedure(s) (LRB): VIDEO BRONCHOSCOPY (N/A) VIDEO ASSISTED THORACOSCOPY (VATS)/DECORTICATION (Right)  Total Length of Stay:  LOS: 3 days   Subjective: Feels well  Objective: Vital signs in last 24 hours: Temp:  [98.4 F (36.9 C)-99.5 F (37.5 C)] 98.4 F (36.9 C) (10/23 0402) Pulse Rate:  [79-101] 87 (10/23 0402) Cardiac Rhythm:  [-] Normal sinus rhythm (10/23 0402) Resp:  [13-22] 20 (10/23 0402) BP: (112-134)/(79-94) 134/86 mmHg (10/23 0402) SpO2:  [90 %-100 %] 98 % (10/23 0402) Arterial Line BP: (147-157)/(88) 157/88 mmHg (10/22 0736) FiO2 (%):  [96 %] 96 % (10/22 0755)  Filed Weights   01/02/13 2013 01/03/13 0052 01/03/13 1824  Weight: 235 lb (106.595 kg) 238 lb 8 oz (108.183 kg) 247 lb 12.8 oz (112.4 kg)    Weight change:    Hemodynamic parameters for last 24 hours:    Intake/Output from previous day: 10/22 0701 - 10/23 0700 In: 2870 [P.O.:1720; I.V.:1100; IV Piggyback:50] Out: 3435 [Urine:3325; Chest Tube:110]  Intake/Output this shift:    Current Meds: Scheduled Meds: . bisacodyl  10 mg Oral Daily  . enoxaparin (LOVENOX) injection  30 mg Subcutaneous Q24H  . famotidine  20 mg Oral BID  . fentaNYL   Intravenous Q4H  . levothyroxine  150 mcg Oral QAC breakfast   Continuous Infusions: . dextrose 5 % and 0.9% NaCl 50 mL/hr (01/04/13 0758)   PRN Meds:.diphenhydrAMINE, diphenhydrAMINE, naloxone, ondansetron (ZOFRAN) IV, ondansetron (ZOFRAN) IV, oxyCODONE-acetaminophen, oxyCODONE-acetaminophen, potassium chloride, senna-docusate, sodium chloride, traMADol  General appearance: alert, cooperative and no distress Heart: regular rate and rhythm Lungs: clear to auscultation bilaterally Extremities: no edema Wound: dressings CDI  Lab Results: CBC: Recent Labs  01/04/13 0450 01/05/13 0455  WBC 11.4* 12.5*    HGB 14.0 13.7  HCT 40.8 39.7  PLT 300 277   BMET:  Recent Labs  01/04/13 0450 01/05/13 0455  NA 139 139  K 4.1 3.6  CL 102 101  CO2 24 30  GLUCOSE 144* 100*  BUN 13 13  CREATININE 1.06 1.23  CALCIUM 9.2 8.5    PT/INR:  Recent Labs  01/03/13 1107  LABPROT 12.8  INR 0.98   Radiology: Dg Chest 2 View  01/03/2013   CLINICAL DATA:  Re-evaluate pneumothorax  EXAM: CHEST  2 VIEW  COMPARISON:  01/02/2013  FINDINGS: The right chest tube has been withdrawn somewhat in the interval from the prior exam. There is been interval re-expansion of the lung although a pneumothorax per cysts with approximately 3.3 cm excursion at the apex. The left lung is clear. The cardiac shadow is within normal limits.  IMPRESSION: Persistent but reduced right pneumothorax when compare with the prior exam. The right chest tube has been withdrawn in the interval.   Electronically Signed   By: Alcide Clever M.D.   On: 01/03/2013 11:03   Dg Chest Port 1 View  01/04/2013   CLINICAL DATA:  The check chest tube placement  EXAM: PORTABLE CHEST - 1 VIEW  COMPARISON:  01/03/2013  FINDINGS: The cardiac shadow is stable. A right-sided central venous line is seen. Two chest tubes are noted on the right. No pneumothorax is seen. The left lung demonstrates some very mild  atelectasis in the base.  IMPRESSION: No evidence of recurrent pneumothorax.  Mild left basilar atelectasis.   Electronically Signed   By: Alcide Clever M.D.   On: 01/04/2013 07:32   Dg Chest Portable 1 View  01/03/2013   CLINICAL DATA:  Postop VATS. Central line placement.  EXAM: PORTABLE CHEST - 1 VIEW  COMPARISON:  CT 01/02/2013. Radiographs earlier today.  FINDINGS: 1659 hr. Two right-sided chest tubes are now in place. There is near-complete evacuation of the previously demonstrated right pneumothorax with a minimal residual apical component. Right IJ central venous catheter projects to the level of the lower SVC. There are low lung volumes with bibasilar  and perihilar atelectasis. The heart size and mediastinal contours appear stable for the degree of inspiration.  IMPRESSION: Central line and 2nd right chest tube placement as described with near-complete evacuation of right-sided pneumothorax. Increased perihilar and lower lobe atelectasis bilaterally.   Electronically Signed   By: Roxy Horseman M.D.   On: 01/03/2013 17:07   Chest tube- no air leak  Assessment/Plan: S/P Procedure(s) (LRB): VIDEO BRONCHOSCOPY (N/A) VIDEO ASSISTED THORACOSCOPY (VATS)/DECORTICATION (Right)  1 conts to do well 2 can d/c chest tube today 3 labs stable 4 cont rehab/pulm toilet 5 poss home in am if no new issues   GOLD,WAYNE E 01/05/2013 7:32 AM    CXR post chest tube pull shows insig R apical pneumo CXR in am and if stable patient may be discharged with restricted activities. Needs referral to smoking cessation clinic

## 2013-01-05 NOTE — Op Note (Signed)
[name], Nicholas Mccarty NO.:  [id number]  MEDICAL RECORD NO.:  [id number]  LOCATION:  3S03C                        FACILITY:  [facility]  PHYSICIAN:  Sheliah Plane, MD    DATE OF BIRTH:  1971-01-18  DATE OF PROCEDURE:  01/03/2013 DATE OF DISCHARGE:                              OPERATIVE REPORT   PREOPERATIVE DIAGNOSIS:  Recurrent right spontaneous pneumothorax.  POSTOPERATIVE DIAGNOSIS:  Recurrent right spontaneous pneumothorax.  SURGICAL PROCEDURE:  Bronchoscopy with right video-assisted thoracoscopy, stapling of apical blebs and mechanical pleurodesis.  SURGEON:  Sheliah Plane, MD  FIRST ASSISTANT:  Rowe Clack, PA-C.  BRIEF HISTORY:  The patient is a 42 year old male who the month previous had been admitted by Dr. Laneta Simmers with spontaneous right pneumothorax. The patient had never previously had a right pneumothorax.  He is a long- term smoker.  A chest tube had been placed at that time with resolution of the pneumothorax.  The patient had been followed up in the office and had a stable chest x-ray, but the night before surgery re-presented to the emergency room with recurrent right pneumothorax.  A 20 chest tube was placed to relieve the greater than 50% pneumothorax and the right video-assisted thoracoscopy and stapling and correction of leak was recommended to the patient.  A preoperative CT scan was performed.  The patient agreed and signed informed consent.  Risks and options had been discussed with him including the high likelihood of recurrent pneumothorax without further treatment.  DESCRIPTION OF PROCEDURE:  The patient underwent general endotracheal anesthesia with a single-lumen endotracheal tube.  Through this, a 2-mm fiberoptic bronchoscope was introduced, and the tracheobronchial tree was examined to the subsegmental level without obvious endobronchial lesions.  The scope was removed and the patient was then turned in right lateral decubitus  position.  The right chest was prepped with Betadine and draped in sterile manner.  A single port site in the midaxillary line was created and through this scope was introduced.  The right upper and middle lobe were collapsed, some air was still left in the lower lobe.  There are no pleural lesions that were obvious.  The patient did have a history of recent thyroid cancer resection.  Careful examination of the lungs revealed multiple grape-like blebs at the apex.  Additional ports were created anteriorly and posteriorly.  Through these ports, the lung was inflated and the apical blebs including one that was obviously ruptured bleb were identified and 2 firings of purple stapler, 60 and a 40 were used to resect the blebs.  These were submitted for Pathology. After this, the lung was inflated with saline in the chest, tested for any other air leaks, and this appeared to be leak free.  Two Blake drains were placed through the 2 anterior most ports and the posterior port was closed.  The lung was reinflated again without evidence of air leak to the chest tubes.  The patient was awakened and extubated in the operating room having tolerated the procedure without obvious complications.  Sponge and needle count was reported as correct at the completion of procedure.  The patient was transferred to the recovery room for postoperative care.  Sheliah Plane, MD     EG/MEDQ  D:  01/05/2013  T:  01/05/2013  Job:  [id number]

## 2013-01-06 ENCOUNTER — Inpatient Hospital Stay (HOSPITAL_COMMUNITY): Payer: Self-pay

## 2013-01-06 MED ORDER — OXYCODONE-ACETAMINOPHEN 5-325 MG PO TABS: 1.0000 | ORAL_TABLET | ORAL | Status: DC | PRN

## 2013-01-06 NOTE — Progress Notes (Signed)
TCTS DAILY ICU PROGRESS NOTE                   [address]            [place] [address]          [phone]   3 Days Post-Op Procedure(s) (LRB): VIDEO BRONCHOSCOPY (N/A) VIDEO ASSISTED THORACOSCOPY (VATS)/DECORTICATION (Right)  Total Length of Stay:  LOS: 4 days   Subjective: Feels well  Objective: Vital signs in last 24 hours: Temp:  [98 F (36.7 C)-99 F (37.2 C)] 98.9 F (37.2 C) (10/24 0407) Pulse Rate:  [79-94] 86 (10/24 0407) Cardiac Rhythm:  [-] Normal sinus rhythm (10/24 0407) Resp:  [16-24] 22 (10/24 0407) BP: (116-139)/(71-89) 117/77 mmHg (10/24 0407) SpO2:  [94 %-98 %] 97 % (10/24 0407)  Filed Weights   01/02/13 2013 01/03/13 0052 01/03/13 1824  Weight: 235 lb (106.595 kg) 238 lb 8 oz (108.183 kg) 247 lb 12.8 oz (112.4 kg)    Weight change:    Hemodynamic parameters for last 24 hours:    Intake/Output from previous day: 10/23 0701 - 10/24 0700 In: 2426.6 [P.O.:2160; I.V.:166.6; IV Piggyback:100] Out: 2360 [Urine:2300; Chest Tube:60]  Intake/Output this shift:    Current Meds: Scheduled Meds: . bisacodyl  10 mg Oral Daily  . enoxaparin (LOVENOX) injection  30 mg Subcutaneous Q24H  . famotidine  20 mg Oral BID  . levothyroxine  150 mcg Oral QAC breakfast   Continuous Infusions:  PRN Meds:.diphenhydrAMINE, ondansetron (ZOFRAN) IV, oxyCODONE-acetaminophen, oxyCODONE-acetaminophen, potassium chloride, senna-docusate, traMADol  General appearance: alert, cooperative and no distress Heart: regular rate and rhythm Lungs: clear to auscultation bilaterally Abdomen: soft, non-tender; bowel sounds normal; no masses,  no organomegaly Wound: clean and dry  Lab Results: CBC: Recent Labs  01/04/13 0450 01/05/13 0455  WBC 11.4* 12.5*  HGB 14.0 13.7  HCT 40.8 39.7  PLT 300 277   BMET:  Recent Labs  01/04/13 0450 01/05/13 0455  NA 139 139  K 4.1 3.6  CL 102 101  CO2 24 30  GLUCOSE 144* 100*  BUN 13 13  CREATININE 1.06  1.23  CALCIUM 9.2 8.5    PT/INR:  Recent Labs  01/03/13 1107  LABPROT 12.8  INR 0.98   Radiology: Dg Chest Port 1 View  01/05/2013   CLINICAL DATA:  42 year old male chest pain status post chest tube removal. Initial encounter.  EXAM: PORTABLE CHEST - 1 VIEW  COMPARISON:  0553 hr the same day and earlier.  FINDINGS: Portable AP upright view at 1024 hrs. Right chest tube is been removed. There is trace right chest wall subcutaneous gas. There is a small right apical pneumothorax (arrow) which is more visible. Residual patchy opacity at the site of the tube. Stable right IJ central line. Mildly larger lung volumes. Stable cardiac size and mediastinal contours.  IMPRESSION: Chest tube removed with small right apical pneumothorax.   Electronically Signed   By: Augusto Gamble M.D.   On: 01/05/2013 10:33   Dg Chest Port 1 View  01/05/2013   CLINICAL DATA:  Evaluate chest tubes  EXAM: PORTABLE CHEST - 1 VIEW  COMPARISON:  Portable chest x-ray of 01/04/2013  FINDINGS: There is some lucency in the right lung apex. No definite pleural line to indicate pneumothorax is evident. A tiny right apical pneumothorax would be difficult to exclude. Surgical sutures are noted in the right lung apex. One of the right chest tubes has been removed, and a single right chest  tube remains. Mild basilar atelectasis is present. Cardiomegaly is stable. A right IJ central venous line overlies the lower SVC.  IMPRESSION: One right chest tube has been withdrawn with a single chest tube remaining. Cannot exclude a tiny right apical pneumothorax.   Electronically Signed   By: Dwyane Dee M.D.   On: 01/05/2013 08:14     Assessment/Plan: S/P Procedure(s) (LRB): VIDEO BRONCHOSCOPY (N/A) VIDEO ASSISTED THORACOSCOPY (VATS)/DECORTICATION (Right) Doing well, CXR- stable Ready for discharge home     Nicholas Mccarty 01/06/2013 7:34 AM

## 2013-01-06 NOTE — Progress Notes (Signed)
Pt discharged per MD order. All discharge instructions reviewed and all questions answered.  

## 2013-01-09 ENCOUNTER — Other Ambulatory Visit: Payer: Self-pay | Admitting: *Deleted

## 2013-01-09 DIAGNOSIS — J939 Pneumothorax, unspecified: Secondary | ICD-10-CM

## 2013-01-12 ENCOUNTER — Ambulatory Visit (INDEPENDENT_AMBULATORY_CARE_PROVIDER_SITE_OTHER): Payer: Self-pay

## 2013-01-12 ENCOUNTER — Other Ambulatory Visit: Payer: Self-pay | Admitting: *Deleted

## 2013-01-12 ENCOUNTER — Ambulatory Visit
Admission: RE | Admit: 2013-01-12 | Discharge: 2013-01-12 | Disposition: A | Discharge status: Home / Self Care | Payer: No Typology Code available for payment source | Source: Ambulatory Visit | Attending: Cardiothoracic Surgery | Admitting: Cardiothoracic Surgery

## 2013-01-12 DIAGNOSIS — J9383 Other pneumothorax: Secondary | ICD-10-CM

## 2013-01-12 DIAGNOSIS — J939 Pneumothorax, unspecified: Secondary | ICD-10-CM

## 2013-01-12 DIAGNOSIS — G8918 Other acute postprocedural pain: Secondary | ICD-10-CM

## 2013-01-12 DIAGNOSIS — N390 Urinary tract infection, site not specified: Secondary | ICD-10-CM

## 2013-01-12 DIAGNOSIS — Z4802 Encounter for removal of sutures: Secondary | ICD-10-CM

## 2013-01-12 DIAGNOSIS — J439 Emphysema, unspecified: Secondary | ICD-10-CM

## 2013-01-12 MED ORDER — OXYCODONE-ACETAMINOPHEN 5-325 MG PO TABS: 1.0000 | ORAL_TABLET | ORAL | Status: DC | PRN

## 2013-01-16 ENCOUNTER — Ambulatory Visit: Payer: Self-pay

## 2013-01-16 NOTE — Progress Notes (Unsigned)
Nicholas Mccarty returns for suture removal of two previous chest tube sites s/p R VATS/DECORTICATION of apical blebs for recurrent right pneumothorax.  These sites asa well as the Vats are all very well healed.  He is breathing well and does not relate any problems except continued discomfort.  His Oxycodone was refilled. He will return as scheduled with a chest xray.

## 2013-01-20 ENCOUNTER — Other Ambulatory Visit: Payer: Self-pay | Admitting: *Deleted

## 2013-01-23 ENCOUNTER — Ambulatory Visit
Admission: RE | Admit: 2013-01-23 | Discharge: 2013-01-23 | Disposition: A | Discharge status: Home / Self Care | Payer: No Typology Code available for payment source | Source: Ambulatory Visit | Attending: Surgery | Admitting: Surgery

## 2013-01-23 ENCOUNTER — Ambulatory Visit (INDEPENDENT_AMBULATORY_CARE_PROVIDER_SITE_OTHER): Payer: Self-pay | Admitting: Surgical

## 2013-01-23 VITALS — BP 131/86 | HR 90 | Resp 20 | Ht 74.0 in | Wt 243.0 lb

## 2013-01-23 DIAGNOSIS — J939 Pneumothorax, unspecified: Secondary | ICD-10-CM

## 2013-01-23 DIAGNOSIS — J9383 Other pneumothorax: Secondary | ICD-10-CM

## 2013-01-23 NOTE — Progress Notes (Signed)
[address]       [place] [address]             [phone]                  Nicholas Mccarty [place] Medical Record #[id number] Date of Birth: 08/28/1970  Nicholas Mccarty., MD Provider Not In System  Chief Complaint:   PostOp Follow Up Visit   History of Present Illness:    The patient is seen on today's date in routine followup. He underwent bronchoscopy with right video-assisted thoracoscopy, stapling of apical blebs and mechanical pleurodesis  this on on 01/03/2013 for recurrent right spontaneous pneumothorax. He is doing well but does report some discomfort primarily at night. He describes it as a sharp discomfort in the anterior pectoral region. He denies shortness of breath. He denies fevers, chills or other constitutional symptoms. He has returned to driving it is not having any difficulty.           History  Smoking status  . Current Some Day Smoker -- 1.00 packs/day  . Types: Cigarettes  Smokeless tobacco  . Never Used       No Known Allergies  Current Outpatient Prescriptions  Medication Sig Dispense Refill  . levothyroxine (SYNTHROID, LEVOTHROID) 75 MCG tablet Take 150 mcg by mouth daily before breakfast.       . oxyCODONE-acetaminophen (PERCOCET/ROXICET) 5-325 MG per tablet Take 1-2 tablets by mouth every 4 (four) hours as needed.  40 tablet  0   No current facility-administered medications for this visit.       Physical Exam: BP 131/86  Pulse 90  Resp 20  Ht 6\' 2"  (1.88 m)  Wt 243 lb (110.224 kg)  BMI 31.19 kg/m2  SpO2 98%  General appearance: alert, cooperative and no distress Heart: regular rate and rhythm Lungs: clear to auscultation bilaterally Wound: Incisions well-healed without evidence of infection.   Diagnostic Studies & Laboratory data:         Recent Radiology Findings: Dg Chest 2 View  01/23/2013   CLINICAL DATA:  History of pneumothorax on the right. Right chest pain.  EXAM: CHEST  2 VIEW   COMPARISON:  01/12/2013  FINDINGS: Improved aeration on the right with decreased patchy density in the right mid and lower lung.  Negative for pneumothorax. Left lung is clear. No effusion.  IMPRESSION: Negative for pneumothorax. Improvement in patchy density right lung.   Electronically Signed   By: Marlan Palau M.D.   On: 01/23/2013 13:36      Recent Labs: Lab Results  Component Value Date   WBC 12.5* 01/05/2013   HGB 13.7 01/05/2013   HCT 39.7 01/05/2013   PLT 277 01/05/2013   GLUCOSE 100* 01/05/2013   CHOL 149 09/02/2011   TRIG 169.0* 09/02/2011   HDL 40.40 09/02/2011   LDLCALC 75 09/02/2011   ALT 25 01/05/2013   AST 19 01/05/2013   NA 139 01/05/2013   K 3.6 01/05/2013   CL 101 01/05/2013   CREATININE 1.23 01/05/2013   BUN 13 01/05/2013   CO2 30 01/05/2013   TSH 1.58 09/02/2011   INR 0.98 01/03/2013   HGBA1C 5.7 09/02/2011      Assessment / Plan:  The patient is doing quite well at this time. History chest x-ray reveals no evidence of pneumothorax and lung fields are clear. I discussed his pain with him and at this point feel as though he would best be relieved  with nonsteroidal anti-inflammatories as there is a strong inflammatory component to his discomfort. I recommended he try Naprosyn for her 3 or 4 days in a row. We will see him again on a when necessary basis.          Mccarty,Nicholas E 01/23/2013 1:43 PM

## 2013-01-23 NOTE — Patient Instructions (Signed)
Use Naprosyn as suggested in standard over-the-counter dosing.

## 2014-01-28 ENCOUNTER — Encounter (HOSPITAL_COMMUNITY): Payer: Self-pay

## 2014-01-28 ENCOUNTER — Emergency Department (HOSPITAL_COMMUNITY)
Admission: EM | Admit: 2014-01-28 | Discharge: 2014-01-28 | Disposition: A | Discharge status: Home / Self Care | Payer: Self-pay | Attending: Emergency Medicine | Admitting: Emergency Medicine

## 2014-01-28 ENCOUNTER — Emergency Department (HOSPITAL_COMMUNITY): Payer: Self-pay

## 2014-01-28 DIAGNOSIS — E079 Disorder of thyroid, unspecified: Secondary | ICD-10-CM | POA: Insufficient documentation

## 2014-01-28 DIAGNOSIS — Z720 Tobacco use: Secondary | ICD-10-CM | POA: Insufficient documentation

## 2014-01-28 DIAGNOSIS — J9801 Acute bronchospasm: Secondary | ICD-10-CM | POA: Insufficient documentation

## 2014-01-28 DIAGNOSIS — Z9889 Other specified postprocedural states: Secondary | ICD-10-CM | POA: Insufficient documentation

## 2014-01-28 DIAGNOSIS — Z87828 Personal history of other (healed) physical injury and trauma: Secondary | ICD-10-CM | POA: Insufficient documentation

## 2014-01-28 DIAGNOSIS — Z79899 Other long term (current) drug therapy: Secondary | ICD-10-CM | POA: Insufficient documentation

## 2014-01-28 DIAGNOSIS — Z85850 Personal history of malignant neoplasm of thyroid: Secondary | ICD-10-CM | POA: Insufficient documentation

## 2014-01-28 DIAGNOSIS — K219 Gastro-esophageal reflux disease without esophagitis: Secondary | ICD-10-CM | POA: Insufficient documentation

## 2014-01-28 DIAGNOSIS — R079 Chest pain, unspecified: Secondary | ICD-10-CM

## 2014-01-28 LAB — BASIC METABOLIC PANEL
ANION GAP: 13 (ref 5–15)
BUN: 18 mg/dL (ref 6–23)
CALCIUM: 8.4 mg/dL (ref 8.4–10.5)
CHLORIDE: 106 meq/L (ref 96–112)
CO2: 23 mEq/L (ref 19–32)
CREATININE: 1.47 mg/dL — AB (ref 0.50–1.35)
GFR calc Af Amer: 65 mL/min — ABNORMAL LOW (ref >90)
GFR calc non Af Amer: 56 mL/min — ABNORMAL LOW (ref >90)
Glucose, Bld: 113 mg/dL — ABNORMAL HIGH (ref 70–99)
Potassium: 3.7 mEq/L (ref 3.7–5.3)
Sodium: 142 mEq/L (ref 137–147)

## 2014-01-28 LAB — CBC
HCT: 39.7 % (ref 39.0–52.0)
HEMOGLOBIN: 13.8 g/dL (ref 13.0–17.0)
MCH: 30 pg (ref 26.0–34.0)
MCHC: 34.8 g/dL (ref 30.0–36.0)
MCV: 86.3 fL (ref 78.0–100.0)
Platelets: 281 10*3/uL (ref 150–400)
RBC: 4.6 MIL/uL (ref 4.22–5.81)
RDW: 13.9 % (ref 11.5–15.5)
WBC: 7.7 10*3/uL (ref 4.0–10.5)

## 2014-01-28 LAB — I-STAT TROPONIN, ED: TROPONIN I, POC: 0.01 ng/mL (ref 0.00–0.08)

## 2014-01-28 LAB — TROPONIN I: Troponin I: <0.3 ng/mL (ref <0.30)

## 2014-01-28 MED ORDER — ALBUTEROL SULFATE HFA 108 (90 BASE) MCG/ACT IN AERS
1.0000 | INHALATION_SPRAY | RESPIRATORY_TRACT | Status: DC | PRN
  Filled 2014-01-28: qty 6.7

## 2014-01-28 MED ORDER — BENZONATATE 100 MG PO CAPS
200.0000 mg | ORAL_CAPSULE | Freq: Once | ORAL | Status: AC
  Administered 2014-01-28: 200 mg via ORAL
  Filled 2014-01-28: qty 2

## 2014-01-28 MED ORDER — IPRATROPIUM-ALBUTEROL 0.5-2.5 (3) MG/3ML IN SOLN
3.0000 mL | Freq: Once | RESPIRATORY_TRACT | Status: AC
  Administered 2014-01-28: 3 mL via RESPIRATORY_TRACT
  Filled 2014-01-28: qty 3

## 2014-01-28 MED ORDER — PREDNISONE 20 MG PO TABS: 60.0000 mg | ORAL_TABLET | Freq: Every day | ORAL | Status: DC

## 2014-01-28 MED ORDER — LORATADINE 10 MG PO TABS
10.0000 mg | ORAL_TABLET | Freq: Every day | ORAL | Status: DC
  Administered 2014-01-28: 10 mg via ORAL
  Filled 2014-01-28: qty 1

## 2014-01-28 MED ORDER — ALBUTEROL SULFATE HFA 108 (90 BASE) MCG/ACT IN AERS: 1.0000 | INHALATION_SPRAY | RESPIRATORY_TRACT | Status: DC | PRN

## 2014-01-28 MED ORDER — LORATADINE 10 MG PO TABS: 10.0000 mg | ORAL_TABLET | Freq: Every day | ORAL | Status: DC

## 2014-01-28 MED ORDER — PREDNISONE 20 MG PO TABS
60.0000 mg | ORAL_TABLET | Freq: Once | ORAL | Status: AC
  Administered 2014-01-28: 60 mg via ORAL
  Filled 2014-01-28: qty 3

## 2014-01-28 NOTE — ED Notes (Signed)
Pt reports some chest tightness off and on for the past week. States he has been having heart burn as well. Takes ranitidine but states it doesn't work very well. Pt has an inhaler and has been using it for the tightness and helps some. Had bronchitis when he was younger never dx with asthma. Denies any pain at present.
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
## 2014-01-28 NOTE — Discharge Instructions (Signed)
Take medications as prescribed.  Return to the emergency department for worsening condition or new concerning symptoms.   Bronchospasm A bronchospasm is a spasm or tightening of the airways going into the lungs. During a bronchospasm breathing becomes more difficult because the airways get smaller. When this happens there can be coughing, a whistling sound when breathing (wheezing), and difficulty breathing. Bronchospasm is often associated with asthma, but not all patients who experience a bronchospasm have asthma. CAUSES  A bronchospasm is caused by inflammation or irritation of the airways. The inflammation or irritation may be triggered by:   Allergies (such as to animals, pollen, food, or mold). Allergens that cause bronchospasm may cause wheezing immediately after exposure or many hours later.   Infection. Viral infections are believed to be the most common cause of bronchospasm.   Exercise.   Irritants (such as pollution, cigarette smoke, strong odors, aerosol sprays, and paint fumes).   Weather changes. Winds increase molds and pollens in the air. Rain refreshes the air by washing irritants out. Cold air may cause inflammation.   Stress and emotional upset.  SIGNS AND SYMPTOMS   Wheezing.   Excessive nighttime coughing.   Frequent or severe coughing with a simple cold.   Chest tightness.   Shortness of breath.  DIAGNOSIS  Bronchospasm is usually diagnosed through a history and physical exam. Tests, such as chest X-rays, are sometimes done to look for other conditions. TREATMENT   Inhaled medicines can be given to open up your airways and help you breathe. The medicines can be given using either an inhaler or a nebulizer machine.  Corticosteroid medicines may be given for severe bronchospasm, usually when it is associated with asthma. HOME CARE INSTRUCTIONS   Always have a plan prepared for seeking medical care. Know when to call your health care provider and  local emergency services (911 in the [place] Know where you can access local emergency care.  Only take medicines as directed by your health care provider.  If you were prescribed an inhaler or nebulizer machine, ask your health care provider to explain how to use it correctly. Always use a spacer with your inhaler if you were given one.  It is necessary to remain calm during an attack. Try to relax and breathe more slowly.  Control your home environment in the following ways:   Change your heating and air conditioning filter at least once a month.   Limit your use of fireplaces and wood stoves.  Do not smoke and do not allow smoking in your home.   Avoid exposure to perfumes and fragrances.   Get rid of pests (such as roaches and mice) and their droppings.   Throw away plants if you see mold on them.   Keep your house clean and dust free.   Replace carpet with wood, tile, or vinyl flooring. Carpet can trap dander and dust.   Use allergy-proof pillows, mattress covers, and box spring covers.   Wash bed sheets and blankets every week in hot water and dry them in a dryer.   Use blankets that are made of polyester or cotton.   Wash hands frequently. SEEK MEDICAL CARE IF:   You have muscle aches.   You have chest pain.   The sputum changes from clear or white to yellow, green, gray, or bloody.   The sputum you cough up gets thicker.   There are problems that may be related to the medicine you are given, such as a rash, itching,  swelling, or trouble breathing.  SEEK IMMEDIATE MEDICAL CARE IF:   You have worsening wheezing and coughing even after taking your prescribed medicines.   You have increased difficulty breathing.   You develop severe chest pain. MAKE SURE YOU:   Understand these instructions.  Will watch your condition.  Will get help right away if you are not doing well or get worse. Document Released: 03/05/2003 Document Revised:  03/07/2013 Document Reviewed: 08/22/2012 [facility] Patient Information 2015 [place], [place]. This information is not intended to replace advice given to you by your health care provider. Make sure you discuss any questions you have with your health care provider.

## 2014-02-04 IMAGING — CR DG CHEST 2V
2 series · 2 of 2 positions shown · non-contrast
Comparison: Multiple recent previous exams.

CLINICAL DATA: Right pneumothorax

EXAM:
CHEST  2 VIEW

[w chest pa]
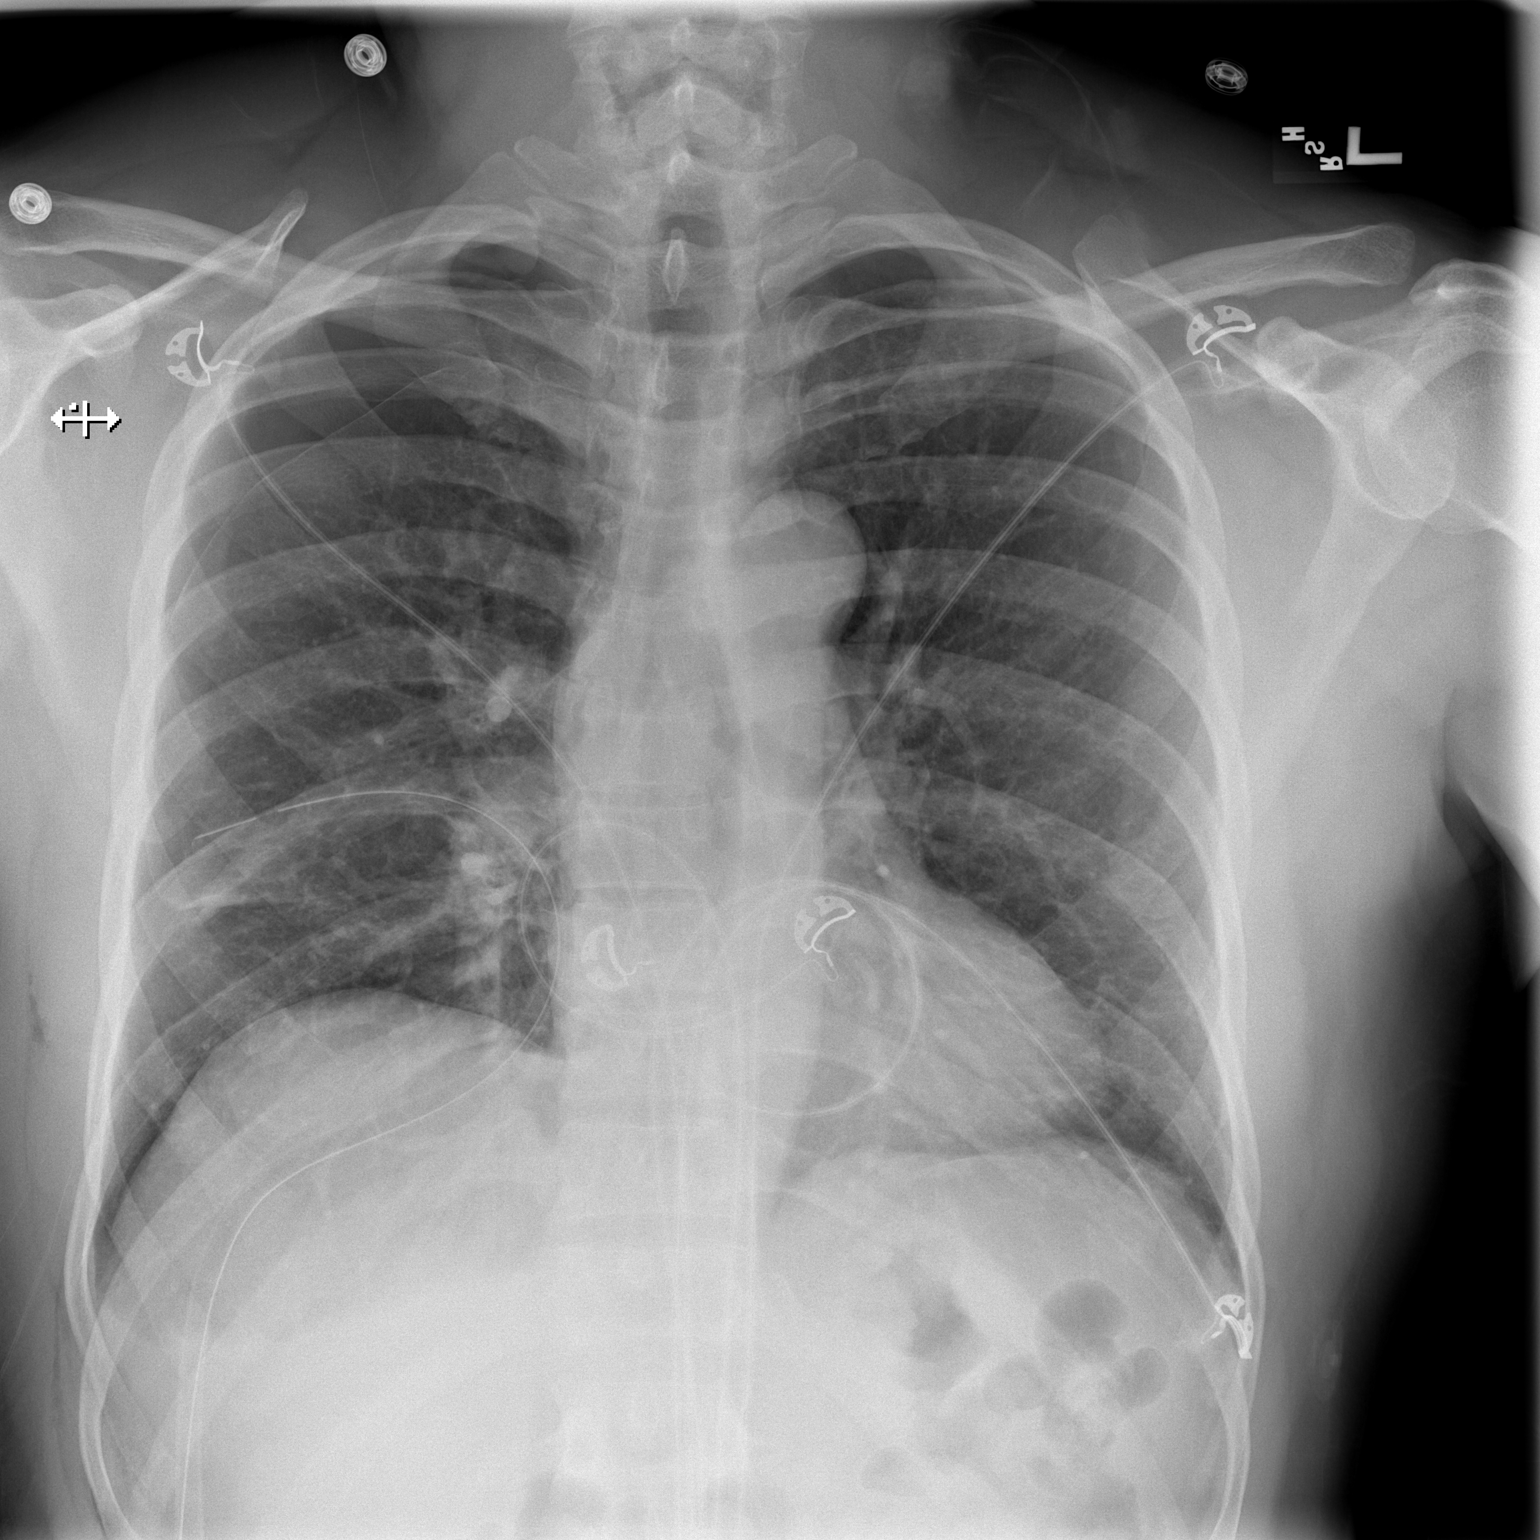

[w chest lat]
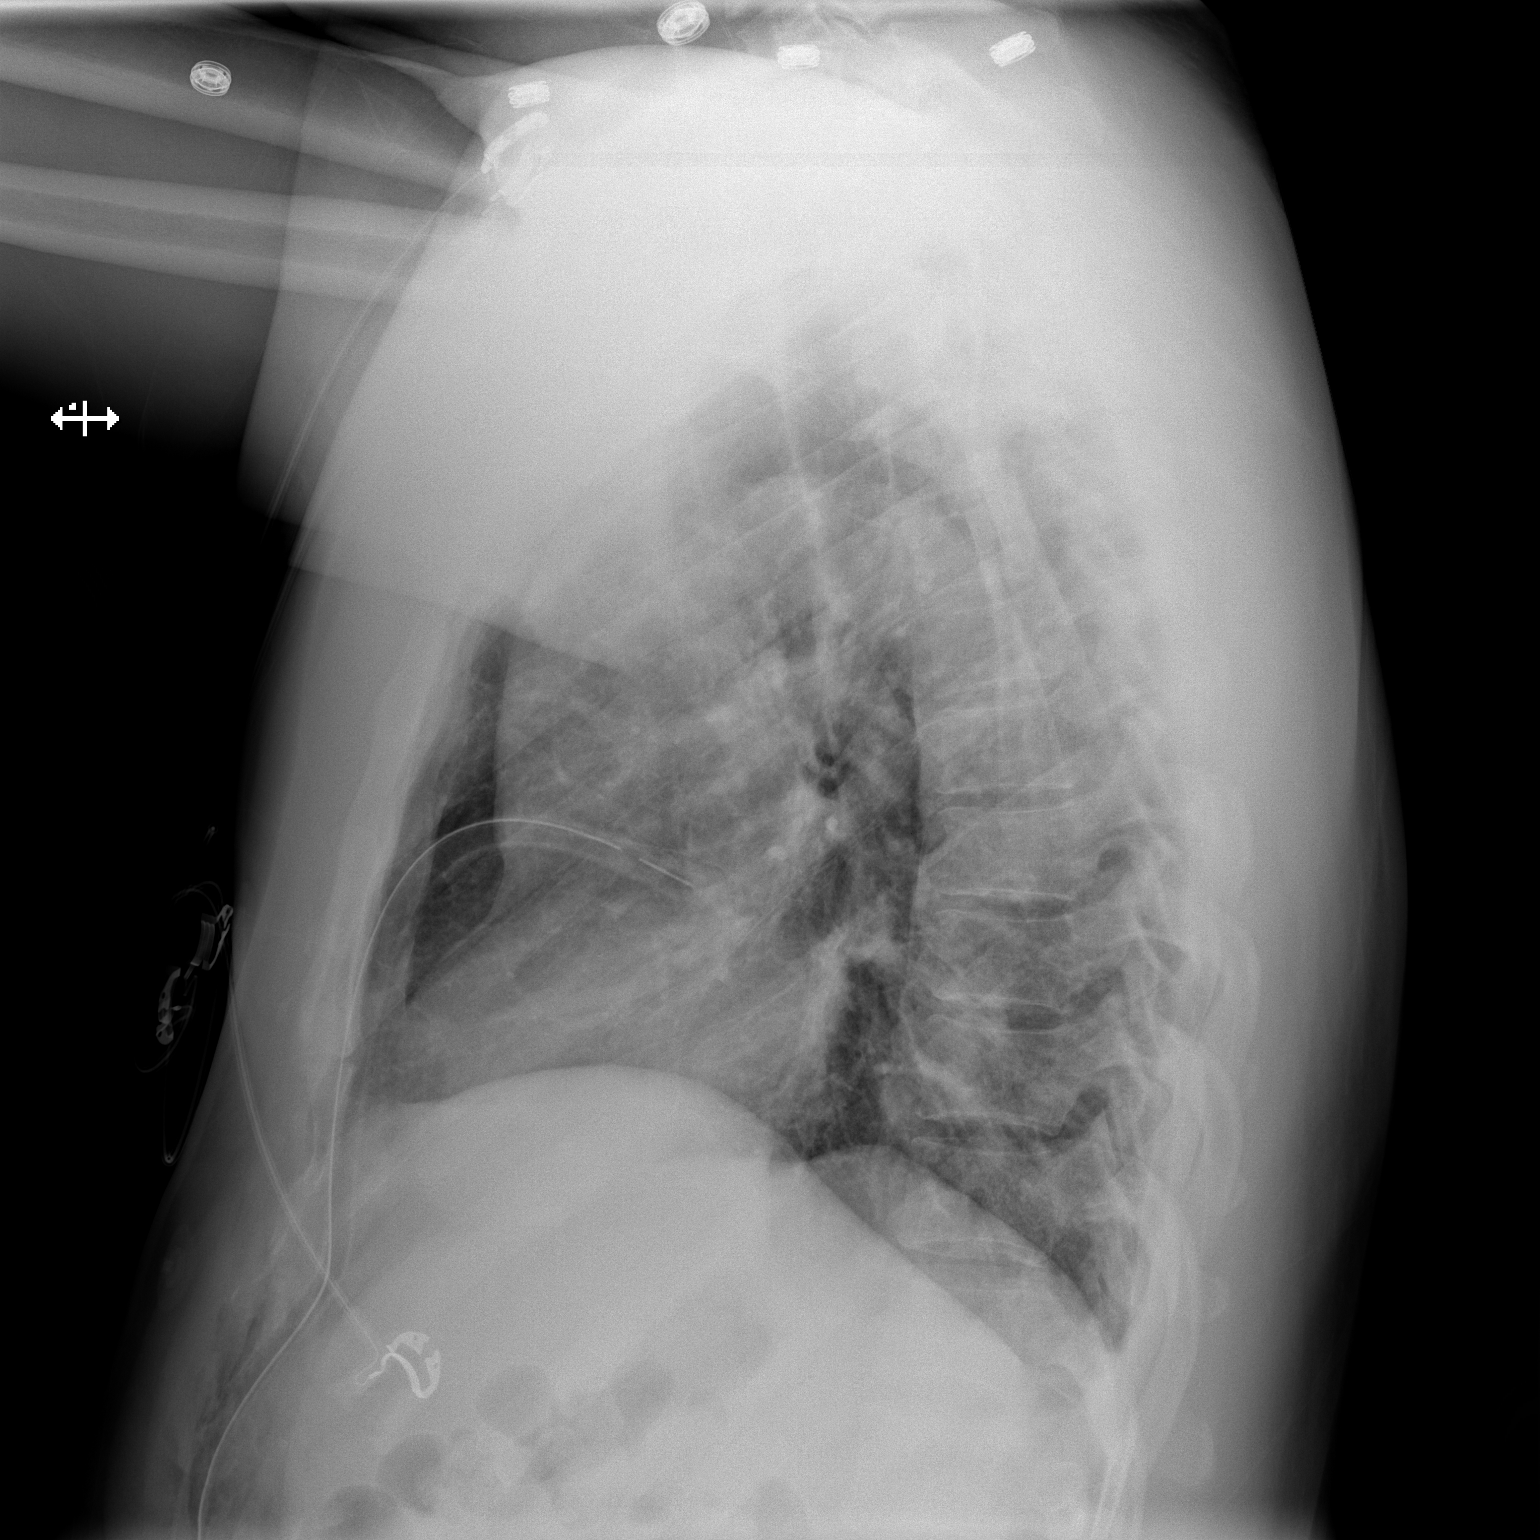

[2 of 2 positions shown; findings below may reference images not displayed]

FINDINGS: Right chest tube remains in place. No substantial change in the
right apical pneumothorax. Right base atelectasis again noted. Heart
size is within normal limits. Telemetry leads overlie the chest.
IMPRESSION: No substantial interval change. Persistent right-sided pneumothorax
with right chest tube still in place.

## 2014-02-19 ENCOUNTER — Encounter (HOSPITAL_COMMUNITY): Payer: Self-pay | Admitting: Physical Medicine and Rehabilitation

## 2014-02-19 ENCOUNTER — Emergency Department (HOSPITAL_COMMUNITY)
Admission: EM | Admit: 2014-02-19 | Discharge: 2014-02-19 | Disposition: A | Discharge status: Home / Self Care | Payer: Self-pay | Attending: Emergency Medicine | Admitting: Emergency Medicine

## 2014-02-19 ENCOUNTER — Emergency Department (HOSPITAL_COMMUNITY): Payer: Self-pay

## 2014-02-19 DIAGNOSIS — J45901 Unspecified asthma with (acute) exacerbation: Secondary | ICD-10-CM | POA: Insufficient documentation

## 2014-02-19 DIAGNOSIS — R51 Headache: Secondary | ICD-10-CM | POA: Insufficient documentation

## 2014-02-19 DIAGNOSIS — Z7952 Long term (current) use of systemic steroids: Secondary | ICD-10-CM | POA: Insufficient documentation

## 2014-02-19 DIAGNOSIS — K219 Gastro-esophageal reflux disease without esophagitis: Secondary | ICD-10-CM | POA: Insufficient documentation

## 2014-02-19 DIAGNOSIS — Z85850 Personal history of malignant neoplasm of thyroid: Secondary | ICD-10-CM | POA: Insufficient documentation

## 2014-02-19 DIAGNOSIS — R059 Cough, unspecified: Secondary | ICD-10-CM

## 2014-02-19 DIAGNOSIS — Z87828 Personal history of other (healed) physical injury and trauma: Secondary | ICD-10-CM | POA: Insufficient documentation

## 2014-02-19 DIAGNOSIS — R05 Cough: Secondary | ICD-10-CM

## 2014-02-19 DIAGNOSIS — Z79899 Other long term (current) drug therapy: Secondary | ICD-10-CM | POA: Insufficient documentation

## 2014-02-19 DIAGNOSIS — Z720 Tobacco use: Secondary | ICD-10-CM | POA: Insufficient documentation

## 2014-02-19 DIAGNOSIS — E079 Disorder of thyroid, unspecified: Secondary | ICD-10-CM | POA: Insufficient documentation

## 2014-02-19 MED ORDER — HYDROCODONE-HOMATROPINE 5-1.5 MG/5ML PO SYRP: 5.0000 mL | ORAL_SOLUTION | Freq: Four times a day (QID) | ORAL | Status: DC | PRN

## 2014-02-19 MED ORDER — IPRATROPIUM-ALBUTEROL 0.5-2.5 (3) MG/3ML IN SOLN
3.0000 mL | Freq: Once | RESPIRATORY_TRACT | Status: AC
  Administered 2014-02-19: 3 mL via RESPIRATORY_TRACT
  Filled 2014-02-19: qty 3

## 2014-02-19 MED ORDER — ALBUTEROL SULFATE HFA 108 (90 BASE) MCG/ACT IN AERS: 1.0000 | INHALATION_SPRAY | Freq: Four times a day (QID) | RESPIRATORY_TRACT | Status: DC | PRN

## 2014-02-19 MED ORDER — PREDNISONE 20 MG PO TABS: 40.0000 mg | ORAL_TABLET | Freq: Every day | ORAL | Status: DC

## 2014-02-19 NOTE — ED Notes (Signed)
Pt presents to department for evaluation of runny nose, chest congestion, cough and headache. Ongoing for several weeks. Respirations unlabored. Pt is alert and oriented x4.
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
## 2014-02-19 NOTE — ED Notes (Signed)
pts vital signs updated pt awaiting discharge paperwork at bedside.  

## 2014-02-19 NOTE — Discharge Instructions (Signed)
Take prednisone as directed until gone. Take hycodan as needed for cough. Use albuterol inhaler as needed for wheezing. Refer to attached documents for more information. Return to the ED with worsening or concerning symptoms.

## 2014-02-19 NOTE — ED Notes (Signed)
Pt from home for eval of sob and wheezing x several weeks, pt states sob has increased since having a cold, pt also reports having to use inhaler more frequently. Pt also reports productive cough for several weeks with green sputum, denies any fevers or n/v/d. Pt with hx of 2 spontaneous pneumothorax. nad noted-wheezing noted upon ausculation.;

## 2014-06-28 ENCOUNTER — Encounter (HOSPITAL_COMMUNITY): Payer: Self-pay | Admitting: *Deleted

## 2014-06-28 ENCOUNTER — Emergency Department (HOSPITAL_COMMUNITY): Payer: Self-pay

## 2014-06-28 ENCOUNTER — Emergency Department (HOSPITAL_COMMUNITY)
Admission: EM | Admit: 2014-06-28 | Discharge: 2014-06-29 | Disposition: A | Discharge status: Home / Self Care | Payer: Self-pay | Attending: Emergency Medicine | Admitting: Emergency Medicine

## 2014-06-28 DIAGNOSIS — R0602 Shortness of breath: Secondary | ICD-10-CM

## 2014-06-28 DIAGNOSIS — Z79899 Other long term (current) drug therapy: Secondary | ICD-10-CM | POA: Insufficient documentation

## 2014-06-28 DIAGNOSIS — Z85850 Personal history of malignant neoplasm of thyroid: Secondary | ICD-10-CM | POA: Insufficient documentation

## 2014-06-28 DIAGNOSIS — K219 Gastro-esophageal reflux disease without esophagitis: Secondary | ICD-10-CM | POA: Insufficient documentation

## 2014-06-28 DIAGNOSIS — R05 Cough: Secondary | ICD-10-CM

## 2014-06-28 DIAGNOSIS — Z720 Tobacco use: Secondary | ICD-10-CM | POA: Insufficient documentation

## 2014-06-28 DIAGNOSIS — J069 Acute upper respiratory infection, unspecified: Secondary | ICD-10-CM | POA: Insufficient documentation

## 2014-06-28 DIAGNOSIS — Z87828 Personal history of other (healed) physical injury and trauma: Secondary | ICD-10-CM | POA: Insufficient documentation

## 2014-06-28 DIAGNOSIS — R059 Cough, unspecified: Secondary | ICD-10-CM

## 2014-06-28 DIAGNOSIS — J45901 Unspecified asthma with (acute) exacerbation: Secondary | ICD-10-CM | POA: Insufficient documentation

## 2014-06-28 DIAGNOSIS — E079 Disorder of thyroid, unspecified: Secondary | ICD-10-CM | POA: Insufficient documentation

## 2014-06-28 DIAGNOSIS — R062 Wheezing: Secondary | ICD-10-CM

## 2014-06-28 MED ORDER — GUAIFENESIN ER 600 MG PO TB12: 600.0000 mg | ORAL_TABLET | Freq: Two times a day (BID) | ORAL | Status: DC | PRN

## 2014-06-28 MED ORDER — PREDNISONE 20 MG PO TABS
60.0000 mg | ORAL_TABLET | Freq: Once | ORAL | Status: AC
  Administered 2014-06-28: 60 mg via ORAL
  Filled 2014-06-28: qty 3

## 2014-06-28 MED ORDER — ALBUTEROL SULFATE (2.5 MG/3ML) 0.083% IN NEBU
5.0000 mg | INHALATION_SOLUTION | Freq: Once | RESPIRATORY_TRACT | Status: AC
  Administered 2014-06-28: 5 mg via RESPIRATORY_TRACT
  Filled 2014-06-28: qty 6

## 2014-06-28 MED ORDER — PREDNISONE 20 MG PO TABS: ORAL_TABLET | ORAL | Status: DC

## 2014-06-28 MED ORDER — IPRATROPIUM BROMIDE 0.02 % IN SOLN
0.5000 mg | Freq: Once | RESPIRATORY_TRACT | Status: AC
  Administered 2014-06-28: 0.5 mg via RESPIRATORY_TRACT
  Filled 2014-06-28: qty 2.5

## 2014-06-28 MED ORDER — ALBUTEROL SULFATE (2.5 MG/3ML) 0.083% IN NEBU: 2.5000 mg | INHALATION_SOLUTION | RESPIRATORY_TRACT | Status: DC | PRN

## 2014-06-28 MED ORDER — LORATADINE 10 MG PO TABS: 10.0000 mg | ORAL_TABLET | Freq: Every day | ORAL | Status: DC
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
## 2014-06-28 NOTE — ED Notes (Signed)
Report to jaclyn, pt care transferred

## 2014-06-28 NOTE — Discharge Instructions (Signed)
Continue to stay well-hydrated. Continue to alternate between Tylenol and Ibuprofen for pain or fever. Use Mucinex for cough suppression/expectoration of mucus. Use netipot and flonase to help with nasal congestion. May consider over-the-counter Benadryl or other antihistamine (like claritin) to decrease secretions and for watery itchy eyes. Use inhaler as directed, as needed for cough/chest congestion. Take prednisone as directed. Followup with your primary care doctor in 5-7 days for recheck of ongoing symptoms. Return to emergency department for emergent changing or worsening of symptoms.   Asthma, Acute Bronchospasm Acute bronchospasm caused by asthma is also referred to as an asthma attack. Bronchospasm means your air passages become narrowed. The narrowing is caused by inflammation and tightening of the muscles in the air tubes (bronchi) in your lungs. This can make it hard to breathe or cause you to wheeze and cough. CAUSES Possible triggers are:  Animal dander from the skin, hair, or feathers of animals.  Dust mites contained in house dust.  Cockroaches.  Pollen from trees or grass.  Mold.  Cigarette or tobacco smoke.  Air pollutants such as dust, household cleaners, hair sprays, aerosol sprays, paint fumes, strong chemicals, or strong odors.  Cold air or weather changes. Cold air may trigger inflammation. Winds increase molds and pollens in the air.  Strong emotions such as crying or laughing hard.  Stress.  Certain medicines such as aspirin or beta-blockers.  Sulfites in foods and drinks, such as dried fruits and wine.  Infections or inflammatory conditions, such as a flu, cold, or inflammation of the nasal membranes (rhinitis).  Gastroesophageal reflux disease (GERD). GERD is a condition where stomach acid backs up into your esophagus.  Exercise or strenuous activity. SIGNS AND SYMPTOMS   Wheezing.  Excessive coughing, particularly at night.  Chest  tightness.  Shortness of breath. DIAGNOSIS  Your health care provider will ask you about your medical history and perform a physical exam. A chest X-ray or blood testing may be performed to look for other causes of your symptoms or other conditions that may have triggered your asthma attack. TREATMENT  Treatment is aimed at reducing inflammation and opening up the airways in your lungs. Most asthma attacks are treated with inhaled medicines. These include quick relief or rescue medicines (such as bronchodilators) and controller medicines (such as inhaled corticosteroids). These medicines are sometimes given through an inhaler or a nebulizer. Systemic steroid medicine taken by mouth or given through an IV tube also can be used to reduce the inflammation when an attack is moderate or severe. Antibiotic medicines are only used if a bacterial infection is present.  HOME CARE INSTRUCTIONS   Rest.  Drink plenty of liquids. This helps the mucus to remain thin and be easily coughed up. Only use caffeine in moderation and do not use alcohol until you have recovered from your illness.  Do not smoke. Avoid being exposed to secondhand smoke.  You play a critical role in keeping yourself in good health. Avoid exposure to things that cause you to wheeze or to have breathing problems.  Keep your medicines up-to-date and available. Carefully follow your health care provider's treatment plan.  Take your medicine exactly as prescribed.  When pollen or pollution is bad, keep windows closed and use an air conditioner or go to places with air conditioning.  Asthma requires careful medical care. See your health care provider for a follow-up as advised. If you are more than [redacted] weeks pregnant and you were prescribed any new medicines, let your obstetrician know  about the visit and how you are doing. Follow up with your health care provider as directed.  After you have recovered from your asthma attack, make an  appointment with your outpatient doctor to talk about ways to reduce the likelihood of future attacks. If you do not have a doctor who manages your asthma, make an appointment with a primary care doctor to discuss your asthma. SEEK IMMEDIATE MEDICAL CARE IF:   You are getting worse.  You have trouble breathing. If severe, call your local emergency services (911 in the [place]  You develop chest pain or discomfort.  You are vomiting.  You are not able to keep fluids down.  You are coughing up yellow, green, brown, or bloody sputum.  You have a fever and your symptoms suddenly get worse.  You have trouble swallowing. MAKE SURE YOU:   Understand these instructions.  Will watch your condition.  Will get help right away if you are not doing well or get worse. Document Released: 06/17/2006 Document Revised: 03/07/2013 Document Reviewed: 09/07/2012 [facility] Patient Information 2015 [place], [place]. This information is not intended to replace advice given to you by your health care provider. Make sure you discuss any questions you have with your health care provider.  Upper Respiratory Infection, Adult An upper respiratory infection (URI) is also known as the common cold. It is often caused by a type of germ (virus). Colds are easily spread (contagious). You can pass it to others by kissing, coughing, sneezing, or drinking out of the same glass. Usually, you get better in 1 or 2 weeks.  HOME CARE   Only take medicine as told by your doctor.  Use a warm mist humidifier or breathe in steam from a hot shower.  Drink enough water and fluids to keep your pee (urine) clear or pale yellow.  Get plenty of rest.  Return to work when your temperature is back to normal or as told by your doctor. You may use a face mask and wash your hands to stop your cold from spreading. GET HELP RIGHT AWAY IF:   After the first few days, you feel you are getting worse.  You have questions about your  medicine.  You have chills, shortness of breath, or brown or red spit (mucus).  You have yellow or brown snot (nasal discharge) or pain in the face, especially when you bend forward.  You have a fever, puffy (swollen) neck, pain when you swallow, or white spots in the back of your throat.  You have a bad headache, ear pain, sinus pain, or chest pain.  You have a high-pitched whistling sound when you breathe in and out (wheezing).  You have a lasting cough or cough up blood.  You have sore muscles or a stiff neck. MAKE SURE YOU:   Understand these instructions.  Will watch your condition.  Will get help right away if you are not doing well or get worse. Document Released: 08/19/2007 Document Revised: 05/25/2011 Document Reviewed: 06/07/2013 [facility] Patient Information 2015 [place], [place]. This information is not intended to replace advice given to you by your health care provider. Make sure you discuss any questions you have with your health care provider.

## 2014-06-28 NOTE — ED Notes (Signed)
Pt c/o shortness of breath for over a week. Pt reports difficulty sleeping and coughing a lot. Pt has hx of asthma.

## 2014-06-29 MED ORDER — ALBUTEROL SULFATE HFA 108 (90 BASE) MCG/ACT IN AERS
2.0000 | INHALATION_SPRAY | Freq: Once | RESPIRATORY_TRACT | Status: AC
Start: 2014-06-29 — End: 2014-06-29
  Administered 2014-06-29: 2 via RESPIRATORY_TRACT
  Filled 2014-06-29: qty 6.7

## 2014-07-26 ENCOUNTER — Ambulatory Visit: Payer: Self-pay | Admitting: Family Medicine

## 2014-09-10 ENCOUNTER — Encounter (HOSPITAL_COMMUNITY): Payer: Self-pay | Admitting: Cardiology

## 2014-09-10 ENCOUNTER — Emergency Department (HOSPITAL_COMMUNITY): Payer: Self-pay

## 2014-09-10 ENCOUNTER — Emergency Department (HOSPITAL_COMMUNITY)
Admission: EM | Admit: 2014-09-10 | Discharge: 2014-09-10 | Disposition: A | Discharge status: Home / Self Care | Payer: Self-pay | Attending: Emergency Medicine | Admitting: Emergency Medicine

## 2014-09-10 DIAGNOSIS — R51 Headache: Secondary | ICD-10-CM | POA: Insufficient documentation

## 2014-09-10 DIAGNOSIS — H53149 Visual discomfort, unspecified: Secondary | ICD-10-CM | POA: Insufficient documentation

## 2014-09-10 DIAGNOSIS — R509 Fever, unspecified: Secondary | ICD-10-CM | POA: Insufficient documentation

## 2014-09-10 DIAGNOSIS — R05 Cough: Secondary | ICD-10-CM | POA: Insufficient documentation

## 2014-09-10 DIAGNOSIS — Z87828 Personal history of other (healed) physical injury and trauma: Secondary | ICD-10-CM | POA: Insufficient documentation

## 2014-09-10 DIAGNOSIS — E079 Disorder of thyroid, unspecified: Secondary | ICD-10-CM | POA: Insufficient documentation

## 2014-09-10 DIAGNOSIS — M542 Cervicalgia: Secondary | ICD-10-CM | POA: Insufficient documentation

## 2014-09-10 DIAGNOSIS — Z85850 Personal history of malignant neoplasm of thyroid: Secondary | ICD-10-CM | POA: Insufficient documentation

## 2014-09-10 DIAGNOSIS — Z79899 Other long term (current) drug therapy: Secondary | ICD-10-CM | POA: Insufficient documentation

## 2014-09-10 DIAGNOSIS — K219 Gastro-esophageal reflux disease without esophagitis: Secondary | ICD-10-CM | POA: Insufficient documentation

## 2014-09-10 DIAGNOSIS — R519 Headache, unspecified: Secondary | ICD-10-CM

## 2014-09-10 DIAGNOSIS — Z720 Tobacco use: Secondary | ICD-10-CM | POA: Insufficient documentation

## 2014-09-10 LAB — COMPREHENSIVE METABOLIC PANEL
ALT: 29 U/L (ref 17–63)
AST: 31 U/L (ref 15–41)
Albumin: 4 g/dL (ref 3.5–5.0)
Alkaline Phosphatase: 62 U/L (ref 38–126)
Anion gap: 8 (ref 5–15)
BILIRUBIN TOTAL: 0.6 mg/dL (ref 0.3–1.2)
BUN: 10 mg/dL (ref 6–20)
CO2: 28 mmol/L (ref 22–32)
Calcium: 8.7 mg/dL — ABNORMAL LOW (ref 8.9–10.3)
Chloride: 101 mmol/L (ref 101–111)
Creatinine, Ser: 1.58 mg/dL — ABNORMAL HIGH (ref 0.61–1.24)
GFR calc Af Amer: 59 mL/min — ABNORMAL LOW (ref >60)
GFR calc non Af Amer: 51 mL/min — ABNORMAL LOW (ref >60)
Glucose, Bld: 98 mg/dL (ref 65–99)
Potassium: 3.3 mmol/L — ABNORMAL LOW (ref 3.5–5.1)
SODIUM: 137 mmol/L (ref 135–145)
TOTAL PROTEIN: 7.1 g/dL (ref 6.5–8.1)

## 2014-09-10 LAB — CBC WITH DIFFERENTIAL/PLATELET
BASOS ABS: 0.1 10*3/uL (ref 0.0–0.1)
Basophils Relative: 1 % (ref 0–1)
EOS PCT: 0 % (ref 0–5)
Eosinophils Absolute: 0 10*3/uL (ref 0.0–0.7)
HEMATOCRIT: 42.8 % (ref 39.0–52.0)
HEMOGLOBIN: 15.3 g/dL (ref 13.0–17.0)
Lymphocytes Relative: 24 % (ref 12–46)
Lymphs Abs: 1.5 10*3/uL (ref 0.7–4.0)
MCH: 30.2 pg (ref 26.0–34.0)
MCHC: 35.7 g/dL (ref 30.0–36.0)
MCV: 84.6 fL (ref 78.0–100.0)
MONOS PCT: 9 % (ref 3–12)
Monocytes Absolute: 0.6 10*3/uL (ref 0.1–1.0)
Neutro Abs: 4.2 10*3/uL (ref 1.7–7.7)
Neutrophils Relative %: 66 % (ref 43–77)
Platelets: 206 10*3/uL (ref 150–400)
RBC: 5.06 MIL/uL (ref 4.22–5.81)
RDW: 13.8 % (ref 11.5–15.5)
WBC: 6.4 10*3/uL (ref 4.0–10.5)

## 2014-09-10 LAB — I-STAT CG4 LACTIC ACID, ED: Lactic Acid, Venous: 1.18 mmol/L (ref 0.5–2.0)

## 2014-09-10 LAB — LIPASE, BLOOD: LIPASE: 29 U/L (ref 22–51)

## 2014-09-10 MED ORDER — ALBUTEROL SULFATE HFA 108 (90 BASE) MCG/ACT IN AERS
2.0000 | INHALATION_SPRAY | RESPIRATORY_TRACT | Status: DC | PRN
  Administered 2014-09-10: 2 via RESPIRATORY_TRACT
  Filled 2014-09-10: qty 6.7

## 2014-09-10 MED ORDER — ACETAMINOPHEN 500 MG PO TABS
1000.0000 mg | ORAL_TABLET | Freq: Once | ORAL | Status: AC
  Administered 2014-09-10: 1000 mg via ORAL
  Filled 2014-09-10: qty 2

## 2014-09-10 MED ORDER — MORPHINE SULFATE 4 MG/ML IJ SOLN
4.0000 mg | Freq: Once | INTRAMUSCULAR | Status: AC
  Administered 2014-09-10: 4 mg via INTRAVENOUS
  Filled 2014-09-10: qty 1

## 2014-09-10 MED ORDER — SODIUM CHLORIDE 0.9 % IV BOLUS (SEPSIS)
1000.0000 mL | Freq: Once | INTRAVENOUS | Status: AC
  Administered 2014-09-10: 1000 mL via INTRAVENOUS

## 2014-09-10 MED ORDER — IOHEXOL 350 MG/ML SOLN
80.0000 mL | Freq: Once | INTRAVENOUS | Status: AC | PRN
  Administered 2014-09-10: 80 mL via INTRAVENOUS

## 2014-09-10 MED ORDER — KETOROLAC TROMETHAMINE 15 MG/ML IJ SOLN
15.0000 mg | Freq: Once | INTRAMUSCULAR | Status: AC
  Administered 2014-09-10: 15 mg via INTRAVENOUS
  Filled 2014-09-10: qty 1

## 2014-09-10 NOTE — ED Notes (Signed)
Pt reports a headache and cough for the past 2 days. Denies any OTC medication use. Pt denies any n/v but reports some sensitivity to light.
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
## 2014-09-10 NOTE — Discharge Instructions (Signed)
Please see a primary care provider to follow-up for the following CT findings: 1. 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 Recommend annual imaging followup by CTA or MRA. This recommendation follows 2010 ACCF/AHA/AATS/ACR/ASA/SCA/SCAI/SIR/STS/SVM Guidelines for the Diagnosis and Management of Patients With Thoracic Aortic Disease. Circulation.  Tension Headache A tension headache is a feeling of pain, pressure, or aching often felt over the front and sides of the head. The pain can be dull or can feel tight (constricting). It is the most common type of headache. Tension headaches are not normally associated with nausea or vomiting and do not get worse with physical activity. Tension headaches can last 30 minutes to several days.  CAUSES  The exact cause is not known, but it may be caused by chemicals and hormones in the brain that lead to pain. Tension headaches often begin after stress, anxiety, or depression. Other triggers may include:  Alcohol.  Caffeine (too much or withdrawal).  Respiratory infections (colds, flu, sinus infections).  Dental problems or teeth clenching.  Fatigue.  Holding your head and neck in one position too long while using a computer. SYMPTOMS   Pressure around the head.   Dull, aching head pain.   Pain felt over the front and sides of the head.   Tenderness in the muscles of the head, neck, and shoulders. DIAGNOSIS  A tension headache is often diagnosed based on:   Symptoms.   Physical examination.   A CT scan or MRI of your head. These tests may be ordered if symptoms are severe or unusual. TREATMENT  Medicines may be given to help relieve symptoms.  HOME CARE INSTRUCTIONS   Only take over-the-counter or prescription medicines for pain or discomfort as directed by your caregiver.   Lie down in a dark, quiet room when you have a headache.   Keep a journal to find out what may be triggering your headaches. For example,  write down:  What you eat and drink.  How much sleep you get.  Any change to your diet or medicines.  Try massage or other relaxation techniques.   Ice packs or heat applied to the head and neck can be used. Use these 3 to 4 times per day for 15 to 20 minutes each time, or as needed.   Limit stress.   Sit up straight, and do not tense your muscles.   Quit smoking if you smoke.  Limit alcohol use.  Decrease the amount of caffeine you drink, or stop drinking caffeine.  Eat and exercise regularly.  Get 7 to 9 hours of sleep, or as recommended by your caregiver.  Avoid excessive use of pain medicine as recurrent headaches can occur.  SEEK MEDICAL CARE IF:   You have problems with the medicines you were prescribed.  Your medicines do not work.  You have a change from the usual headache.  You have nausea or vomiting. SEEK IMMEDIATE MEDICAL CARE IF:   Your headache becomes severe.  You have a fever.  You have a stiff neck.  You have loss of vision.  You have muscular weakness or loss of muscle control.  You lose your balance or have trouble walking.  You feel faint or pass out.  You have severe symptoms that are different from your first symptoms. MAKE SURE YOU:   Understand these instructions.  Will watch your condition.  Will get help right away if you are not doing well or get worse. Document Released: 03/02/2005 Document Revised: 05/25/2011 Document  Reviewed: 02/20/2011 [employer] Patient Information 2015 [place], [place]. This information is not intended to replace advice given to you by your health care provider. Make sure you discuss any questions you have with your health care provider.

## 2014-09-10 NOTE — ED Provider Notes (Signed)
MSE was initiated and I personally evaluated the patient and placed orders (if any) at  2:17 PM on September 10, 2014.  The patient appears stable so that the remainder of the MSE may be completed by another provider.  Nicholas Mccarty is a 44 y.o. male with PMHx of thyroid CA, pneumothorax, and substance abuse who presents to the Emergency Department complaining of severe 8/10 constant pressure headache to back of head with onset 2 days ago. Pt states this is the worse headache of his life. He states pain radiates to his neck and causes some neck stiffness. Pt reports coughing worsens the pain. Pt notes associated cold sweats, tactile fever, chills, generalized body aches, photophobia, intermittent lightheadedness, generalized abdominal pain, and diarrhea. Pt denies IVDA. Pt with h/o thyroid CA 3 years ago which was surgically removed. He denies phonophobia, chest pain, SOB, cough, nausea, vomiting, hematochezia, melena, rhinorrhea, sore throat, difficulty urinating, hematuria, dysuria, penile discharge, numbness, tingling, dizziness, or weakness.  On exam, PERRL, EOMI, no nystagmus, but some neck stiffness with lateral movements. Pt appears uncomfortable.   VS: BP 130/85 mmHg  Pulse 101  Temp(Src) 99.8 F (37.7 C) (Oral)  Resp 18  Ht 6\' 2"  (1.88 m)  Wt 260 lb (117.935 kg)  BMI 33.37 kg/m2  SpO2 98%  Due to the hx of cancer, worst headache of his life, and low grade temp with tachycardia, will get screening labs and move to pod room  Antwyne Pingree Camprubi-Soms, PA-C 09/10/14 [place], MD 09/10/14 [phone]

## 2014-09-11 ENCOUNTER — Emergency Department (HOSPITAL_COMMUNITY)
Admission: EM | Admit: 2014-09-11 | Discharge: 2014-09-11 | Disposition: A | Discharge status: Home / Self Care | Payer: Self-pay | Attending: Emergency Medicine | Admitting: Emergency Medicine

## 2014-09-11 ENCOUNTER — Encounter (HOSPITAL_COMMUNITY): Payer: Self-pay | Admitting: Emergency Medicine

## 2014-09-11 DIAGNOSIS — E079 Disorder of thyroid, unspecified: Secondary | ICD-10-CM | POA: Insufficient documentation

## 2014-09-11 DIAGNOSIS — Z8709 Personal history of other diseases of the respiratory system: Secondary | ICD-10-CM | POA: Insufficient documentation

## 2014-09-11 DIAGNOSIS — R51 Headache: Secondary | ICD-10-CM | POA: Insufficient documentation

## 2014-09-11 DIAGNOSIS — R519 Headache, unspecified: Secondary | ICD-10-CM

## 2014-09-11 DIAGNOSIS — Z87828 Personal history of other (healed) physical injury and trauma: Secondary | ICD-10-CM | POA: Insufficient documentation

## 2014-09-11 DIAGNOSIS — Z85850 Personal history of malignant neoplasm of thyroid: Secondary | ICD-10-CM | POA: Insufficient documentation

## 2014-09-11 DIAGNOSIS — Z720 Tobacco use: Secondary | ICD-10-CM | POA: Insufficient documentation

## 2014-09-11 DIAGNOSIS — Z79899 Other long term (current) drug therapy: Secondary | ICD-10-CM | POA: Insufficient documentation

## 2014-09-11 DIAGNOSIS — R197 Diarrhea, unspecified: Secondary | ICD-10-CM | POA: Insufficient documentation

## 2014-09-11 DIAGNOSIS — R509 Fever, unspecified: Secondary | ICD-10-CM | POA: Insufficient documentation

## 2014-09-11 DIAGNOSIS — K219 Gastro-esophageal reflux disease without esophagitis: Secondary | ICD-10-CM | POA: Insufficient documentation

## 2014-09-11 LAB — CBC WITH DIFFERENTIAL/PLATELET
BASOS PCT: 1 % (ref 0–1)
Basophils Absolute: 0.1 10*3/uL (ref 0.0–0.1)
EOS ABS: 0 10*3/uL (ref 0.0–0.7)
EOS PCT: 0 % (ref 0–5)
HCT: 41.1 % (ref 39.0–52.0)
Hemoglobin: 14.6 g/dL (ref 13.0–17.0)
Lymphocytes Relative: 24 % (ref 12–46)
Lymphs Abs: 1.3 10*3/uL (ref 0.7–4.0)
MCH: 29.9 pg (ref 26.0–34.0)
MCHC: 35.5 g/dL (ref 30.0–36.0)
MCV: 84.2 fL (ref 78.0–100.0)
Monocytes Absolute: 0.4 10*3/uL (ref 0.1–1.0)
Monocytes Relative: 8 % (ref 3–12)
NEUTROS PCT: 67 % (ref 43–77)
Neutro Abs: 3.5 10*3/uL (ref 1.7–7.7)
Platelets: 143 10*3/uL — ABNORMAL LOW (ref 150–400)
RBC: 4.88 MIL/uL (ref 4.22–5.81)
RDW: 13.8 % (ref 11.5–15.5)
WBC: 5.3 10*3/uL (ref 4.0–10.5)

## 2014-09-11 LAB — URINALYSIS, ROUTINE W REFLEX MICROSCOPIC
Bilirubin Urine: NEGATIVE
Glucose, UA: NEGATIVE mg/dL
KETONES UR: NEGATIVE mg/dL
LEUKOCYTES UA: NEGATIVE
NITRITE: NEGATIVE
Protein, ur: 30 mg/dL — AB
Specific Gravity, Urine: 1.017 (ref 1.005–1.030)
UROBILINOGEN UA: 1 mg/dL (ref 0.0–1.0)
pH: 6 (ref 5.0–8.0)

## 2014-09-11 LAB — I-STAT CHEM 8, ED
BUN: 7 mg/dL (ref 6–20)
Calcium, Ion: 1.1 mmol/L — ABNORMAL LOW (ref 1.12–1.23)
Chloride: 99 mmol/L — ABNORMAL LOW (ref 101–111)
Creatinine, Ser: 1.6 mg/dL — ABNORMAL HIGH (ref 0.61–1.24)
Glucose, Bld: 101 mg/dL — ABNORMAL HIGH (ref 65–99)
HEMATOCRIT: 46 % (ref 39.0–52.0)
HEMOGLOBIN: 15.6 g/dL (ref 13.0–17.0)
Potassium: 3.3 mmol/L — ABNORMAL LOW (ref 3.5–5.1)
SODIUM: 138 mmol/L (ref 135–145)
TCO2: 23 mmol/L (ref 0–100)

## 2014-09-11 LAB — URINE MICROSCOPIC-ADD ON

## 2014-09-11 MED ORDER — KETOROLAC TROMETHAMINE 60 MG/2ML IM SOLN
60.0000 mg | Freq: Once | INTRAMUSCULAR | Status: AC
  Administered 2014-09-11: 60 mg via INTRAMUSCULAR
  Filled 2014-09-11: qty 2

## 2014-09-11 MED ORDER — DIPHENHYDRAMINE HCL 50 MG/ML IJ SOLN
25.0000 mg | Freq: Once | INTRAMUSCULAR | Status: AC
  Administered 2014-09-11: 25 mg via INTRAMUSCULAR
  Filled 2014-09-11: qty 1

## 2014-09-11 MED ORDER — TRAMADOL HCL 50 MG PO TABS: 50.0000 mg | ORAL_TABLET | Freq: Four times a day (QID) | ORAL | Status: DC | PRN

## 2014-09-11 MED ORDER — PROMETHAZINE HCL 25 MG PO TABS: 25.0000 mg | ORAL_TABLET | Freq: Four times a day (QID) | ORAL | Status: DC | PRN

## 2014-09-11 MED ORDER — METOCLOPRAMIDE HCL 5 MG/ML IJ SOLN
10.0000 mg | Freq: Once | INTRAMUSCULAR | Status: AC
  Administered 2014-09-11: 10 mg via INTRAMUSCULAR
  Filled 2014-09-11: qty 2

## 2014-09-11 MED ORDER — DIPHENOXYLATE-ATROPINE 2.5-0.025 MG PO TABS: 2.0000 | ORAL_TABLET | Freq: Four times a day (QID) | ORAL | Status: DC | PRN

## 2014-09-11 MED ORDER — ACETAMINOPHEN 325 MG PO TABS
975.0000 mg | ORAL_TABLET | Freq: Once | ORAL | Status: AC
  Administered 2014-09-11: 975 mg via ORAL

## 2014-09-11 NOTE — ED Notes (Signed)
MD at bedside. 

## 2014-09-11 NOTE — Discharge Instructions (Signed)
General Headache Without Cause A general headache is pain or discomfort felt around the head or neck area. The cause may not be found.  HOME CARE   Keep all doctor visits.  Only take medicines as told by your doctor.  Lie down in a dark, quiet room when you have a headache.  Keep a journal to find out if certain things bring on headaches. For example, write down:  What you eat and drink.  How much sleep you get.  Any change to your diet or medicines.  Relax by getting a massage or doing other relaxing activities.  Put ice or heat packs on the head and neck area as told by your doctor.  Lessen stress.  Sit up straight. Do not tighten (tense) your muscles.  Quit smoking if you smoke.  Lessen how much alcohol you drink.  Lessen how much caffeine you drink, or stop drinking caffeine.  Eat and sleep on a regular schedule.  Get 7 to 9 hours of sleep, or as told by your doctor.  Keep lights dim if bright lights bother you or make your headaches worse. GET HELP RIGHT AWAY IF:   Your headache becomes really bad.  You have a fever.  You have a stiff neck.  You have trouble seeing.  Your muscles are weak, or you lose muscle control.  You lose your balance or have trouble walking.  You feel like you will pass out (faint), or you pass out.  You have really bad symptoms that are different than your first symptoms.  You have problems with the medicines given to you by your doctor.  Your medicines do not work.  Your headache feels different than the other headaches.  You feel sick to your stomach (nauseous) or throw up (vomit). MAKE SURE YOU:   Understand these instructions.  Will watch your condition.  Will get help right away if you are not doing well or get worse. Document Released: 12/10/2007 Document Revised: 05/25/2011 Document Reviewed: 02/20/2011 [facility] Patient Information 2015 [place], [place]. This information is not intended to replace advice given to  you by your health care provider. Make sure you discuss any questions you have with your health care provider.  Fever, Adult A fever is a temperature of 100.4 F (38 C) or above.  HOME CARE  Take fever medicine as told by your doctor. Do not  take aspirin for fever if you are younger than 46 years of age.  If you are given antibiotic medicine, take it as told. Finish the medicine even if you start to feel better.  Rest.  Drink enough fluids to keep your pee (urine) clear or pale yellow. Do not drink alcohol.  Take a bath or shower with room temperature water. Do not use ice water or alcohol sponge baths.  Wear lightweight, loose clothes. GET HELP RIGHT AWAY IF:   You are short of breath or have trouble breathing.  You are very weak.  You are dizzy or you pass out (faint).  You are very thirsty or are making little or no urine.  You have new pain.  You throw up (vomit) or have watery poop (diarrhea).  You keep throwing up or having watery poop for more than 1 to 2 days.  You have a stiff neck or light bothers your eyes.  You have a skin rash.  You have a fever or problems (symptoms) that last for more than 2 to 3 days.  You have a fever and your  problems quickly get worse.  You keep throwing up the fluids you drink.  You do not feel better after 3 days.  You have new problems. MAKE SURE YOU:   Understand these instructions.  Will watch your condition.  Will get help right away if you are not doing well or get worse. Document Released: 12/10/2007 Document Revised: 05/25/2011 Document Reviewed: 01/01/2011 [facility] Patient Information 2015 [place], [place]. This information is not intended to replace advice given to you by your health care provider. Make sure you discuss any questions you have with your health care provider.

## 2014-09-11 NOTE — ED Notes (Signed)
Received verbal order for Tylenol 975mg  from Mariah Milling, [place]
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
## 2014-09-11 NOTE — ED Notes (Signed)
Pt c/o headache, fever and chills x's 4 days.  St's he was seen here yesterday for same and is not any better,  Pt denies vomiting,.  Diarrhea x's 3 days

## 2014-12-15 ENCOUNTER — Emergency Department (HOSPITAL_COMMUNITY)
Admission: EM | Admit: 2014-12-15 | Discharge: 2014-12-17 | Disposition: A | Discharge status: Home / Self Care | Payer: Self-pay | Attending: Emergency Medicine | Admitting: Emergency Medicine

## 2014-12-15 DIAGNOSIS — Z85850 Personal history of malignant neoplasm of thyroid: Secondary | ICD-10-CM | POA: Insufficient documentation

## 2014-12-15 DIAGNOSIS — E079 Disorder of thyroid, unspecified: Secondary | ICD-10-CM | POA: Insufficient documentation

## 2014-12-15 DIAGNOSIS — K219 Gastro-esophageal reflux disease without esophagitis: Secondary | ICD-10-CM | POA: Insufficient documentation

## 2014-12-15 DIAGNOSIS — Z720 Tobacco use: Secondary | ICD-10-CM | POA: Insufficient documentation

## 2014-12-15 DIAGNOSIS — Z87828 Personal history of other (healed) physical injury and trauma: Secondary | ICD-10-CM | POA: Insufficient documentation

## 2014-12-15 DIAGNOSIS — F1994 Other psychoactive substance use, unspecified with psychoactive substance-induced mood disorder: Secondary | ICD-10-CM | POA: Diagnosis present

## 2014-12-15 DIAGNOSIS — F1210 Cannabis abuse, uncomplicated: Secondary | ICD-10-CM | POA: Insufficient documentation

## 2014-12-15 DIAGNOSIS — F1414 Cocaine abuse with cocaine-induced mood disorder: Secondary | ICD-10-CM | POA: Insufficient documentation

## 2014-12-15 DIAGNOSIS — F1410 Cocaine abuse, uncomplicated: Secondary | ICD-10-CM | POA: Diagnosis present

## 2014-12-15 DIAGNOSIS — Z79899 Other long term (current) drug therapy: Secondary | ICD-10-CM | POA: Insufficient documentation

## 2014-12-15 DIAGNOSIS — G478 Other sleep disorders: Secondary | ICD-10-CM | POA: Insufficient documentation
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
## 2014-12-16 ENCOUNTER — Encounter (HOSPITAL_COMMUNITY): Payer: Self-pay | Admitting: Emergency Medicine

## 2014-12-16 DIAGNOSIS — F1910 Other psychoactive substance abuse, uncomplicated: Secondary | ICD-10-CM

## 2014-12-16 LAB — COMPREHENSIVE METABOLIC PANEL
ALK PHOS: 62 U/L (ref 38–126)
ALT: 34 U/L (ref 17–63)
ANION GAP: 9 (ref 5–15)
AST: 48 U/L — ABNORMAL HIGH (ref 15–41)
Albumin: 4.6 g/dL (ref 3.5–5.0)
BUN: 21 mg/dL — ABNORMAL HIGH (ref 6–20)
CALCIUM: 9 mg/dL (ref 8.9–10.3)
CO2: 29 mmol/L (ref 22–32)
CREATININE: 1.63 mg/dL — AB (ref 0.61–1.24)
Chloride: 100 mmol/L — ABNORMAL LOW (ref 101–111)
GFR, EST AFRICAN AMERICAN: 57 mL/min — AB (ref >60)
GFR, EST NON AFRICAN AMERICAN: 49 mL/min — AB (ref >60)
Glucose, Bld: 89 mg/dL (ref 65–99)
Potassium: 3.7 mmol/L (ref 3.5–5.1)
Sodium: 138 mmol/L (ref 135–145)
TOTAL PROTEIN: 8 g/dL (ref 6.5–8.1)
Total Bilirubin: 1 mg/dL (ref 0.3–1.2)

## 2014-12-16 LAB — RAPID URINE DRUG SCREEN, HOSP PERFORMED
Amphetamines: NOT DETECTED
Barbiturates: NOT DETECTED
Benzodiazepines: NOT DETECTED
COCAINE: POSITIVE — AB
OPIATES: NOT DETECTED
TETRAHYDROCANNABINOL: POSITIVE — AB

## 2014-12-16 LAB — CBC WITH DIFFERENTIAL/PLATELET
Basophils Absolute: 0 10*3/uL (ref 0.0–0.1)
Basophils Relative: 0 %
EOS ABS: 0.1 10*3/uL (ref 0.0–0.7)
Eosinophils Relative: 1 %
HEMATOCRIT: 42.7 % (ref 39.0–52.0)
HEMOGLOBIN: 14.8 g/dL (ref 13.0–17.0)
LYMPHS ABS: 3.9 10*3/uL (ref 0.7–4.0)
LYMPHS PCT: 35 %
MCH: 30.1 pg (ref 26.0–34.0)
MCHC: 34.7 g/dL (ref 30.0–36.0)
MCV: 86.8 fL (ref 78.0–100.0)
MONOS PCT: 8 %
Monocytes Absolute: 0.9 10*3/uL (ref 0.1–1.0)
NEUTROS ABS: 6.1 10*3/uL (ref 1.7–7.7)
NEUTROS PCT: 56 %
Platelets: 304 10*3/uL (ref 150–400)
RBC: 4.92 MIL/uL (ref 4.22–5.81)
RDW: 14 % (ref 11.5–15.5)
WBC: 11 10*3/uL — AB (ref 4.0–10.5)

## 2014-12-16 LAB — ETHANOL: Alcohol, Ethyl (B): <5 mg/dL (ref <5)
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
## 2014-12-16 NOTE — Progress Notes (Signed)
Disposition CSW completed patient referrals for patient to the following inpatient psych facilities:  [facility]  CSW will follow patient for placement needs.  [facility] Disposition CSW [phone]

## 2014-12-16 NOTE — ED Notes (Signed)
Provided a toothbrush and tooth paste.
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
## 2014-12-16 NOTE — ED Notes (Addendum)
Pt's Belongings 1 pair red sneakers. 1 black belt 1 pair jean shorts 1 red shirt 1 pair of boxers 1 black cricket cell phone [place], Name on Card: Lucas Mallow ending in [id number] 1 pink citation.  Belongings Placed in [place] [address]

## 2014-12-16 NOTE — BH Assessment (Signed)
Assessment completed. Consulted Darlyne Russian, PA-C who recommended that pt be evaluated by psychiatry in the morning. Dr. Zenia Resides has been informed of the recommendation.

## 2014-12-16 NOTE — BH Assessment (Signed)
Unable to gather collateral information at this time. Petition voicemail does not have any identifying information. Will call later.

## 2014-12-16 NOTE — Progress Notes (Signed)
11:02am. Dr. Harrington Challenger upholds pt's IVC. CSW filed paperwork and faxed to [email or web address].  [place] Worker [place] [facility] phone: [phone]

## 2014-12-17 DIAGNOSIS — F1994 Other psychoactive substance use, unspecified with psychoactive substance-induced mood disorder: Secondary | ICD-10-CM

## 2014-12-17 DIAGNOSIS — F1410 Cocaine abuse, uncomplicated: Secondary | ICD-10-CM | POA: Diagnosis present

## 2014-12-17 MED ORDER — ALUM & MAG HYDROXIDE-SIMETH 200-200-20 MG/5ML PO SUSP: 30.0000 mL | ORAL | Status: DC | PRN

## 2014-12-17 MED ORDER — ZOLPIDEM TARTRATE 5 MG PO TABS: 5.0000 mg | ORAL_TABLET | Freq: Every evening | ORAL | Status: DC | PRN

## 2014-12-17 MED ORDER — LORAZEPAM 1 MG PO TABS
1.0000 mg | ORAL_TABLET | Freq: Three times a day (TID) | ORAL | Status: DC | PRN
  Administered 2014-12-17: 1 mg via ORAL
  Filled 2014-12-17: qty 1

## 2014-12-17 MED ORDER — ACETAMINOPHEN 325 MG PO TABS: 650.0000 mg | ORAL_TABLET | ORAL | Status: DC | PRN

## 2014-12-17 MED ORDER — ONDANSETRON HCL 4 MG PO TABS: 4.0000 mg | ORAL_TABLET | Freq: Three times a day (TID) | ORAL | Status: DC | PRN

## 2014-12-17 MED ORDER — IBUPROFEN 200 MG PO TABS: 600.0000 mg | ORAL_TABLET | Freq: Three times a day (TID) | ORAL | Status: DC | PRN

## 2014-12-17 MED ORDER — NICOTINE 21 MG/24HR TD PT24: 21.0000 mg | MEDICATED_PATCH | Freq: Every day | TRANSDERMAL | Status: DC

## 2014-12-17 MED ORDER — ALBUTEROL SULFATE HFA 108 (90 BASE) MCG/ACT IN AERS
2.0000 | INHALATION_SPRAY | Freq: Four times a day (QID) | RESPIRATORY_TRACT | Status: DC | PRN
  Administered 2014-12-17: 2 via RESPIRATORY_TRACT
  Filled 2014-12-17: qty 6.7

## 2014-12-17 NOTE — ED Notes (Signed)
Pt.s belongings were itemized by tcu and locked in locker 33.

## 2014-12-17 NOTE — ED Notes (Signed)
Patient was transported to [facility]. Pt ambulated to room 36. Report given to Dominica Severin, Therapist, sports.

## 2014-12-17 NOTE — ED Notes (Signed)
Pt. Noted sleeping in room. No complaints or concerns voiced. No distress or abnormal behavior noted. Will continue to monitor with security cameras. Q 15 minute rounds continue. 

## 2014-12-17 NOTE — B[facility] Suicide Risk Assessment (Signed)
Suicide Risk Assessment  Discharge Assessment   [facility] Discharge Suicide Risk Assessment   Demographic Factors:  Male  Total Time spent with patient: 30 minutes  Musculoskeletal: Strength & Muscle Tone: within normal limits Gait & Station: normal Patient leans: N/A  Psychiatric Specialty Exam: Review of Systems  Constitutional: Negative.   HENT: Negative.   Eyes: Negative.   Respiratory: Negative.   Cardiovascular: Negative.   Gastrointestinal: Negative.   Genitourinary: Negative.   Musculoskeletal: Negative.   Skin: Negative.   Neurological: Negative.   Endo/Heme/Allergies: Negative.   Psychiatric/Behavioral: Positive for substance abuse.    Blood pressure 98/62, pulse 68, temperature 97.7 F (36.5 C), temperature source Oral, resp. rate 16, SpO2 96 %.There is no weight on file to calculate BMI.  General Appearance: Casual  Eye Contact::  Good  Speech:  Normal Rate  Volume:  Normal  Mood:  Euthymic  Affect:  Congruent  Thought Process:  Coherent  Orientation:  Full (Time, Place, and Person)  Thought Content:  WDL  Suicidal Thoughts:  No  Homicidal Thoughts:  No  Memory:  Immediate;   Good Recent;   Good Remote;   Good  Judgement:  Fair  Insight:  Good  Psychomotor Activity:  Normal  Concentration:  Good  Recall:  Good  Fund of Knowledge:Good  Language: Good  Akathisia:  No  Handed:  Right  AIMS (if indicated):     Assets:  Housing Leisure Time Physical Health Resilience Social Support  ADL's:  Intact  Cognition: WNL  Sleep:         Has this patient used any form of tobacco in the last 30 days? (Cigarettes, Smokeless Tobacco, Cigars, and/or Pipes) Yes, A prescription for an FDA-approved tobacco cessation medication was offered at discharge and the patient refused  Mental Status Per Nursing Assessment::   On Admission:   Cocaine abuse  Current Mental Status by Physician: NA  Loss Factors: NA  Historical Factors: NA  Risk Reduction Factors:    Responsible for children under 29 years of age, Sense of responsibility to family, Living with another person, especially a relative and Positive social support  Continued Clinical Symptoms:  None  Cognitive Features That Contribute To Risk:  None    Suicide Risk:  Minimal: No identifiable suicidal ideation.  Patients presenting with no risk factors but with morbid ruminations; may be classified as minimal risk based on the severity of the depressive symptoms  Principal Problem: Substance induced mood disorder [facility]) Discharge Diagnoses:  Patient Active Problem List   Diagnosis Date Noted  . Cocaine abuse [F14.10] 12/17/2014    Priority: High  . Substance induced mood disorder ([place]) [F19.94] 12/17/2014    Priority: High  . Pneumothorax on right [J93.9] 01/02/2013  . Acid reflux [K21.9] 07/08/2012  . Malignant neoplasm of thyroid gland ([address]) [C73] 07/08/2012  . Other abnormal glucose [R73.09] 09/02/2011  . Plantar fasciitis, bilateral [M72.2] 07/22/2011  . Neuropathic pain of thigh, right [G57.91] 07/22/2011  . 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 Thyroid cancer ([place]) [C73] 04/27/2011      Plan Of Care/Follow-up recommendations:  Activity:  as tolerated  Diet:  heart healthy diet  Is patient on multiple antipsychotic therapies at discharge:  No   Has Patient had three or more failed trials of antipsychotic monotherapy by history:  No  Recommended Plan for Multiple Antipsychotic Therapies: NA    LORD, JAMISON, PMH-NP 12/17/2014, 11:33 AM

## 2014-12-17 NOTE — BH Assessment (Signed)
[place] Assessment Progress Note  Per Donnelly Angelica, MD, this pt does not require psychiatric hospitalization at this time.  He is to be released from [facility] and discharged from [facility] with substance abuse treatment referrals.  IVC has been rescinded.  Discharge instructions include referral information for [facility] for outpatient treatment, and for [facility] and [facility] for residential treatment.  Pt's nurse, Nena Jordan, has been notified.  Jalene Mullet, [place] Triage Specialist [phone]

## 2014-12-17 NOTE — ED Notes (Signed)
Pt. To [facility] from ED ambulatory without difficulty, to room 36 . Report from [facility]. Pt. Is alert and oriented, warm and dry in no distress. Pt. Denies SI, HI, and AVH. Pt. Calm and cooperative. Pt. Made aware of security cameras and Q15 minute rounds. Pt. Encouraged to let Nursing staff know of any concerns or needs.

## 2014-12-17 NOTE — ED Notes (Signed)
Pt. C/o anxiety and insomnia. 

## 2014-12-17 NOTE — Discharge Instructions (Signed)
To help you maintain a sober lifestyle, a substance abuse treatment program may be beneficial to you.  Contact one of the following providers to see about enrolling in their program:  OUTPATIENT PROGRAMS:       [facility] ([facility]      [address], [place] [place], [place] [address]      [phone]      New patients are seen at the walk-in clinic every Tuesday from 9:00 am - 12:00 pm.  RESIDENTIAL PROGRAMS:       [facility]      [place], [place] [address]      [phone]       [facility]      [address] [place], [place] [address]      [phone]       [facility]      [place], [place] [address]      [phone]

## 2014-12-17 NOTE — ED Notes (Signed)
Pt denies SI and HI and AVH.  He is irritable and fidgety as he is being discharged.  All belongings were returned to pt. At discharge and we charged his phone a few minutes so he could call someone.  Discharge instructions reviewed.

## 2014-12-17 NOTE — Progress Notes (Signed)
CM spoke with pt who confirms uninsured [place] resident with no pcp.  CM discussed and provided written information for uninsured accepting pcps, discussed the importance of pcp vs EDP services for f/u care, www.needymeds.org, www.goodrx.com, discounted pharmacies and other [place] resources such as [facility] , [facility], affordable care act, financial assistance, uninsured dental services, [place] med assist, DSS and  health department  Reviewed resources for [place] uninsured accepting pcps like [facility], [facility] [address], [facility], [facility], local urgent care centers, [facility], [facility], general medical clinics, [facility], [facility] plus others, medication resources, [facility] out patient pharmacies and housing Pt voiced understanding and appreciation of resources provided   Provided [facility] contact information Pt agreed to a referral Cm completed referral Pt to be contact by [facility] clinical liason  
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
## 2015-03-03 ENCOUNTER — Inpatient Hospital Stay (HOSPITAL_COMMUNITY)
Admission: AD | Admit: 2015-03-03 | Discharge: 2015-03-08 | DRG: 885 | Disposition: A | Discharge status: Home / Self Care | Payer: Federal, State, Local not specified - Other | Source: Intra-hospital | Attending: Psychiatry | Admitting: Psychiatry

## 2015-03-03 ENCOUNTER — Emergency Department (HOSPITAL_COMMUNITY)
Admission: EM | Admit: 2015-03-03 | Discharge: 2015-03-03 | Disposition: A | Discharge status: Other Acute Inpatient Hospital | Payer: Self-pay | Attending: Emergency Medicine | Admitting: Emergency Medicine

## 2015-03-03 ENCOUNTER — Encounter (HOSPITAL_COMMUNITY): Payer: Self-pay | Admitting: Emergency Medicine

## 2015-03-03 ENCOUNTER — Encounter (HOSPITAL_COMMUNITY): Payer: Self-pay

## 2015-03-03 ENCOUNTER — Emergency Department (HOSPITAL_COMMUNITY): Payer: Self-pay

## 2015-03-03 DIAGNOSIS — R441 Visual hallucinations: Secondary | ICD-10-CM | POA: Insufficient documentation

## 2015-03-03 DIAGNOSIS — R0602 Shortness of breath: Secondary | ICD-10-CM | POA: Insufficient documentation

## 2015-03-03 DIAGNOSIS — E079 Disorder of thyroid, unspecified: Secondary | ICD-10-CM | POA: Insufficient documentation

## 2015-03-03 DIAGNOSIS — Z85850 Personal history of malignant neoplasm of thyroid: Secondary | ICD-10-CM | POA: Insufficient documentation

## 2015-03-03 DIAGNOSIS — Z87828 Personal history of other (healed) physical injury and trauma: Secondary | ICD-10-CM | POA: Insufficient documentation

## 2015-03-03 DIAGNOSIS — F1494 Cocaine use, unspecified with cocaine-induced mood disorder: Secondary | ICD-10-CM | POA: Diagnosis present

## 2015-03-03 DIAGNOSIS — R300 Dysuria: Secondary | ICD-10-CM | POA: Insufficient documentation

## 2015-03-03 DIAGNOSIS — F1210 Cannabis abuse, uncomplicated: Secondary | ICD-10-CM | POA: Insufficient documentation

## 2015-03-03 DIAGNOSIS — Z87891 Personal history of nicotine dependence: Secondary | ICD-10-CM

## 2015-03-03 DIAGNOSIS — R45851 Suicidal ideations: Secondary | ICD-10-CM | POA: Insufficient documentation

## 2015-03-03 DIAGNOSIS — F3164 Bipolar disorder, current episode mixed, severe, with psychotic features: Principal | ICD-10-CM | POA: Diagnosis present

## 2015-03-03 DIAGNOSIS — K219 Gastro-esophageal reflux disease without esophagitis: Secondary | ICD-10-CM | POA: Insufficient documentation

## 2015-03-03 DIAGNOSIS — F1424 Cocaine dependence with cocaine-induced mood disorder: Secondary | ICD-10-CM | POA: Insufficient documentation

## 2015-03-03 DIAGNOSIS — Z79899 Other long term (current) drug therapy: Secondary | ICD-10-CM | POA: Insufficient documentation

## 2015-03-03 DIAGNOSIS — F1420 Cocaine dependence, uncomplicated: Secondary | ICD-10-CM | POA: Diagnosis present

## 2015-03-03 DIAGNOSIS — F1220 Cannabis dependence, uncomplicated: Secondary | ICD-10-CM | POA: Diagnosis present

## 2015-03-03 DIAGNOSIS — F1410 Cocaine abuse, uncomplicated: Secondary | ICD-10-CM | POA: Diagnosis present

## 2015-03-03 DIAGNOSIS — F17210 Nicotine dependence, cigarettes, uncomplicated: Secondary | ICD-10-CM | POA: Insufficient documentation

## 2015-03-03 DIAGNOSIS — E876 Hypokalemia: Secondary | ICD-10-CM | POA: Diagnosis present

## 2015-03-03 DIAGNOSIS — Z8709 Personal history of other diseases of the respiratory system: Secondary | ICD-10-CM | POA: Insufficient documentation

## 2015-03-03 LAB — COMPREHENSIVE METABOLIC PANEL
ALT: 24 U/L (ref 17–63)
ANION GAP: 9 (ref 5–15)
AST: 29 U/L (ref 15–41)
Albumin: 4.4 g/dL (ref 3.5–5.0)
Alkaline Phosphatase: 54 U/L (ref 38–126)
BUN: 19 mg/dL (ref 6–20)
CALCIUM: 9 mg/dL (ref 8.9–10.3)
CHLORIDE: 104 mmol/L (ref 101–111)
CO2: 28 mmol/L (ref 22–32)
Creatinine, Ser: 1.68 mg/dL — ABNORMAL HIGH (ref 0.61–1.24)
GFR calc non Af Amer: 47 mL/min — ABNORMAL LOW (ref >60)
GFR, EST AFRICAN AMERICAN: 55 mL/min — AB (ref >60)
Glucose, Bld: 93 mg/dL (ref 65–99)
Potassium: 3.4 mmol/L — ABNORMAL LOW (ref 3.5–5.1)
SODIUM: 141 mmol/L (ref 135–145)
Total Bilirubin: 0.6 mg/dL (ref 0.3–1.2)
Total Protein: 7.5 g/dL (ref 6.5–8.1)

## 2015-03-03 LAB — RAPID URINE DRUG SCREEN, HOSP PERFORMED
Amphetamines: NOT DETECTED
Barbiturates: NOT DETECTED
Benzodiazepines: NOT DETECTED
COCAINE: POSITIVE — AB
OPIATES: NOT DETECTED
Tetrahydrocannabinol: POSITIVE — AB

## 2015-03-03 LAB — URINE MICROSCOPIC-ADD ON
BACTERIA UA: NONE SEEN
RBC / HPF: NONE SEEN RBC/hpf (ref 0–5)
SQUAMOUS EPITHELIAL / LPF: NONE SEEN

## 2015-03-03 LAB — URINALYSIS, ROUTINE W REFLEX MICROSCOPIC
Glucose, UA: NEGATIVE mg/dL
HGB URINE DIPSTICK: NEGATIVE
Ketones, ur: NEGATIVE mg/dL
NITRITE: NEGATIVE
PROTEIN: NEGATIVE mg/dL
SPECIFIC GRAVITY, URINE: 1.035 — AB (ref 1.005–1.030)
pH: 6 (ref 5.0–8.0)

## 2015-03-03 LAB — CBC
HCT: 42.9 % (ref 39.0–52.0)
HEMOGLOBIN: 15 g/dL (ref 13.0–17.0)
MCH: 30.7 pg (ref 26.0–34.0)
MCHC: 35 g/dL (ref 30.0–36.0)
MCV: 87.7 fL (ref 78.0–100.0)
PLATELETS: 303 10*3/uL (ref 150–400)
RBC: 4.89 MIL/uL (ref 4.22–5.81)
RDW: 14 % (ref 11.5–15.5)
WBC: 8.2 10*3/uL (ref 4.0–10.5)

## 2015-03-03 LAB — ETHANOL: Alcohol, Ethyl (B): <5 mg/dL (ref <5)

## 2015-03-03 LAB — I-STAT TROPONIN, ED: Troponin i, poc: 0 ng/mL (ref 0.00–0.08)

## 2015-03-03 LAB — ACETAMINOPHEN LEVEL

## 2015-03-03 LAB — SALICYLATE LEVEL

## 2015-03-03 MED ORDER — LURASIDONE HCL 80 MG PO TABS
80.0000 mg | ORAL_TABLET | Freq: Every day | ORAL | Status: DC
  Filled 2015-03-03 (×2): qty 1

## 2015-03-03 MED ORDER — ACETAMINOPHEN 325 MG PO TABS: 650.0000 mg | ORAL_TABLET | ORAL | Status: DC | PRN

## 2015-03-03 MED ORDER — ALUM & MAG HYDROXIDE-SIMETH 200-200-20 MG/5ML PO SUSP
30.0000 mL | ORAL | Status: DC | PRN
  Administered 2015-03-06: 30 mL via ORAL
  Filled 2015-03-03: qty 30

## 2015-03-03 MED ORDER — LORAZEPAM 1 MG PO TABS: 1.0000 mg | ORAL_TABLET | Freq: Three times a day (TID) | ORAL | Status: DC | PRN

## 2015-03-03 MED ORDER — ONDANSETRON HCL 4 MG PO TABS: 4.0000 mg | ORAL_TABLET | Freq: Three times a day (TID) | ORAL | Status: DC | PRN

## 2015-03-03 MED ORDER — ACETAMINOPHEN 325 MG PO TABS
650.0000 mg | ORAL_TABLET | Freq: Four times a day (QID) | ORAL | Status: DC | PRN
  Administered 2015-03-04: 650 mg via ORAL
  Filled 2015-03-03: qty 2

## 2015-03-03 MED ORDER — HYDROXYZINE HCL 25 MG PO TABS: 25.0000 mg | ORAL_TABLET | Freq: Three times a day (TID) | ORAL | Status: DC | PRN

## 2015-03-03 MED ORDER — PANTOPRAZOLE SODIUM 40 MG PO TBEC
40.0000 mg | DELAYED_RELEASE_TABLET | Freq: Every day | ORAL | Status: DC
  Administered 2015-03-03 – 2015-03-08 (×6): 40 mg via ORAL
  Filled 2015-03-03: qty 7
  Filled 2015-03-03 (×9): qty 1

## 2015-03-03 MED ORDER — TRAZODONE HCL 50 MG PO TABS
50.0000 mg | ORAL_TABLET | Freq: Every day | ORAL | Status: DC
  Filled 2015-03-03 (×4): qty 1

## 2015-03-03 MED ORDER — IBUPROFEN 200 MG PO TABS: 600.0000 mg | ORAL_TABLET | Freq: Three times a day (TID) | ORAL | Status: DC | PRN

## 2015-03-03 MED ORDER — LORATADINE 10 MG PO TABS
10.0000 mg | ORAL_TABLET | Freq: Every day | ORAL | Status: DC
  Administered 2015-03-04 – 2015-03-08 (×4): 10 mg via ORAL
  Filled 2015-03-03 (×5): qty 1
  Filled 2015-03-03: qty 7
  Filled 2015-03-03 (×3): qty 1

## 2015-03-03 MED ORDER — INFLUENZA VAC SPLIT QUAD 0.5 ML IM SUSY
0.5000 mL | PREFILLED_SYRINGE | INTRAMUSCULAR | Status: AC
  Administered 2015-03-04: 0.5 mL via INTRAMUSCULAR
  Filled 2015-03-03: qty 0.5

## 2015-03-03 MED ORDER — PANTOPRAZOLE SODIUM 40 MG PO TBEC
DELAYED_RELEASE_TABLET | ORAL | Status: AC
  Filled 2015-03-03: qty 1

## 2015-03-03 MED ORDER — NICOTINE 21 MG/24HR TD PT24: 21.0000 mg | MEDICATED_PATCH | Freq: Every day | TRANSDERMAL | Status: DC

## 2015-03-03 MED ORDER — ALBUTEROL SULFATE HFA 108 (90 BASE) MCG/ACT IN AERS
1.0000 | INHALATION_SPRAY | Freq: Four times a day (QID) | RESPIRATORY_TRACT | Status: DC | PRN
Start: 2015-03-03 — End: 2015-03-08
  Administered 2015-03-04: 1 via RESPIRATORY_TRACT
  Administered 2015-03-05: 2 via RESPIRATORY_TRACT
  Administered 2015-03-05: 1 via RESPIRATORY_TRACT
  Administered 2015-03-07 – 2015-03-08 (×3): 2 via RESPIRATORY_TRACT
  Filled 2015-03-03: qty 6.7

## 2015-03-03 MED ORDER — HYDROXYZINE HCL 25 MG PO TABS
25.0000 mg | ORAL_TABLET | Freq: Three times a day (TID) | ORAL | Status: DC | PRN
  Administered 2015-03-05 – 2015-03-06 (×2): 25 mg via ORAL
  Filled 2015-03-03: qty 10
  Filled 2015-03-03 (×2): qty 1

## 2015-03-03 MED ORDER — ALBUTEROL SULFATE (2.5 MG/3ML) 0.083% IN NEBU: 2.5000 mg | INHALATION_SOLUTION | RESPIRATORY_TRACT | Status: DC | PRN

## 2015-03-03 MED ORDER — LORATADINE 10 MG PO TABS
10.0000 mg | ORAL_TABLET | Freq: Every day | ORAL | Status: DC
  Administered 2015-03-03: 10 mg via ORAL
  Filled 2015-03-03: qty 1

## 2015-03-03 MED ORDER — ALUM & MAG HYDROXIDE-SIMETH 200-200-20 MG/5ML PO SUSP: 30.0000 mL | ORAL | Status: DC | PRN

## 2015-03-03 MED ORDER — FAMOTIDINE 20 MG PO TABS
10.0000 mg | ORAL_TABLET | Freq: Every day | ORAL | Status: DC
  Administered 2015-03-05 – 2015-03-07 (×2): 10 mg via ORAL
  Filled 2015-03-03: qty 4
  Filled 2015-03-03 (×6): qty 1

## 2015-03-03 MED ORDER — LEVOTHYROXINE SODIUM 200 MCG PO TABS
200.0000 ug | ORAL_TABLET | Freq: Every day | ORAL | Status: DC
  Administered 2015-03-03: 200 ug via ORAL
  Filled 2015-03-03 (×2): qty 1

## 2015-03-03 MED ORDER — ALBUTEROL SULFATE (2.5 MG/3ML) 0.083% IN NEBU
2.5000 mg | INHALATION_SOLUTION | RESPIRATORY_TRACT | Status: DC | PRN
  Administered 2015-03-03: 2.5 mg via RESPIRATORY_TRACT
  Filled 2015-03-03 (×3): qty 3

## 2015-03-03 MED ORDER — LURASIDONE HCL 80 MG PO TABS
80.0000 mg | ORAL_TABLET | Freq: Every day | ORAL | Status: DC
  Administered 2015-03-03: 80 mg via ORAL
  Filled 2015-03-03 (×4): qty 1

## 2015-03-03 MED ORDER — FAMOTIDINE 20 MG PO TABS: 10.0000 mg | ORAL_TABLET | Freq: Every day | ORAL | Status: DC

## 2015-03-03 MED ORDER — TRAZODONE HCL 50 MG PO TABS: 50.0000 mg | ORAL_TABLET | Freq: Every day | ORAL | Status: DC

## 2015-03-03 MED ORDER — LEVOTHYROXINE SODIUM 100 MCG PO TABS
200.0000 ug | ORAL_TABLET | Freq: Every day | ORAL | Status: DC
  Administered 2015-03-04 – 2015-03-08 (×5): 200 ug via ORAL
  Filled 2015-03-03: qty 2
  Filled 2015-03-03 (×3): qty 1
  Filled 2015-03-03: qty 14
  Filled 2015-03-03: qty 2
  Filled 2015-03-03 (×3): qty 1

## 2015-03-03 MED ORDER — ALBUTEROL SULFATE HFA 108 (90 BASE) MCG/ACT IN AERS
1.0000 | INHALATION_SPRAY | Freq: Four times a day (QID) | RESPIRATORY_TRACT | Status: DC | PRN
  Administered 2015-03-03: 1 via RESPIRATORY_TRACT
  Filled 2015-03-03: qty 6.7

## 2015-03-03 MED ORDER — MAGNESIUM HYDROXIDE 400 MG/5ML PO SUSP: 30.0000 mL | Freq: Every day | ORAL | Status: DC | PRN

## 2015-03-03 MED ORDER — LURASIDONE HCL 80 MG PO TABS
80.0000 mg | ORAL_TABLET | Freq: Every day | ORAL | Status: DC
  Filled 2015-03-03: qty 1

## 2015-03-03 NOTE — Progress Notes (Signed)
Pt reports sensitive toothpaste was left at [facility] prior to transfer, Luanne RN notified, toothpaste sent over and placed in 400 hall med room, pt notified, pt reports he must use this toothpaste.

## 2015-03-03 NOTE — Progress Notes (Signed)
Patient accepted to [facility] room 307-2. Clayborne Dana, RN
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
## 2015-03-03 NOTE — Tx Team (Signed)
Initial Interdisciplinary Treatment Plan   PATIENT STRESSORS: Medication change or noncompliance Substance abuse   PATIENT STRENGTHS: Ability for insight Average or above average intelligence Capable of independent living General fund of knowledge Motivation for treatment/growth   PROBLEM LIST: Problem List/Patient Goals Date to be addressed Date deferred Reason deferred Estimated date of resolution  Substance abuse 03/03/2015     Noncompliant with medication regimen 03/03/2015     "I just hope to learn something that's going to keep me focused." 03/03/2015     "I just don't know what to do" 03/03/2015                                    DISCHARGE CRITERIA:  Ability to meet basic life and health needs Adequate post-discharge living arrangements Improved stabilization in mood, thinking, and/or behavior  PRELIMINARY DISCHARGE PLAN: Return to previous living arrangement  PATIENT/FAMIILY INVOLVEMENT: This treatment plan has been presented to and reviewed with the patient, Nicholas Mccarty, [place] 03/03/2015, 4:53 PM

## 2015-03-03 NOTE — ED Notes (Signed)
Repeat EKG given to MD [place].

## 2015-03-03 NOTE — ED Notes (Signed)
Bed: [facility] Expected date:  Expected time:  Means of arrival:  Comments: T4

## 2015-03-03 NOTE — BH Assessment (Signed)
Assessment completed. Patient meets inpatient criteria for 300 hall per Dr. Darleene Cleaver. TTS to seek placement.   Rosalin Hawking, LCSW Therapeutic Triage Specialist [place] 03/03/2015 10:30 AM

## 2015-03-03 NOTE — Progress Notes (Signed)
Adult Psychoeducational Group Note  Date:  03/03/2015 Time:  10:08 PM  Group Topic/Focus:  Wrap-Up Group:   The focus of this group is to help patients review their daily goal of treatment and discuss progress on daily workbooks.  Participation Level:  Active  Participation Quality:  Appropriate and Attentive  Affect:  Appropriate  Cognitive:  Appropriate  Insight: Appropriate and Good  Engagement in Group:  Engaged  Modes of Intervention:  Education  Additional Comments:  Pt overall had a good day and it was better than yesterday. Patient goal is to stay focus on treatment.   Jerline Pain 03/03/2015, 10:08 PM

## 2015-03-03 NOTE — ED Notes (Signed)
Pt from home c/o suicidal ideations and requesting detox from "crack". He reports using crack yesterday morning about 0800 and drinking alcohol. He denies a specific plan to harm self and denies homicidal ideations. Pt calm and cooperative during triage.

## 2015-03-03 NOTE — BH Assessment (Signed)
Patients nurse informed of patients acceptance to [facility] AC will call with transport time.   Rosalin Hawking, LCSW Therapeutic Triage Specialist [place] 03/03/2015 11:26 AM'

## 2015-03-03 NOTE — ED Notes (Signed)
C/O SOB.  Inhaler given and resp called for nebulizer treatment.
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
## 2015-03-03 NOTE — ED Notes (Signed)
Attempted to call report to [facility], awaiting for call back.
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
## 2015-03-04 ENCOUNTER — Encounter (HOSPITAL_COMMUNITY): Payer: Self-pay | Admitting: Psychiatry

## 2015-03-04 DIAGNOSIS — F1494 Cocaine use, unspecified with cocaine-induced mood disorder: Secondary | ICD-10-CM | POA: Diagnosis not present

## 2015-03-04 DIAGNOSIS — E876 Hypokalemia: Secondary | ICD-10-CM | POA: Clinically undetermined

## 2015-03-04 DIAGNOSIS — F3164 Bipolar disorder, current episode mixed, severe, with psychotic features: Secondary | ICD-10-CM | POA: Diagnosis present

## 2015-03-04 DIAGNOSIS — F1220 Cannabis dependence, uncomplicated: Secondary | ICD-10-CM | POA: Clinically undetermined

## 2015-03-04 MED ORDER — DIVALPROEX SODIUM ER 250 MG PO TB24
750.0000 mg | ORAL_TABLET | Freq: Every day | ORAL | Status: DC
Start: 2015-03-04 — End: 2015-03-08
  Administered 2015-03-05 – 2015-03-07 (×3): 750 mg via ORAL
  Filled 2015-03-04 (×6): qty 3

## 2015-03-04 MED ORDER — BENZTROPINE MESYLATE 0.5 MG PO TABS
0.5000 mg | ORAL_TABLET | Freq: Every day | ORAL | Status: DC
  Administered 2015-03-05 – 2015-03-07 (×3): 0.5 mg via ORAL
  Filled 2015-03-04: qty 7
  Filled 2015-03-04 (×6): qty 1

## 2015-03-04 MED ORDER — HALOPERIDOL 2 MG PO TABS
2.0000 mg | ORAL_TABLET | Freq: Every day | ORAL | Status: DC
  Administered 2015-03-05 – 2015-03-07 (×3): 2 mg via ORAL
  Filled 2015-03-04 (×5): qty 1
  Filled 2015-03-04: qty 7
  Filled 2015-03-04: qty 1

## 2015-03-04 MED ORDER — POTASSIUM CHLORIDE CRYS ER 10 MEQ PO TBCR
10.0000 meq | EXTENDED_RELEASE_TABLET | Freq: Two times a day (BID) | ORAL | Status: AC
  Administered 2015-03-04 – 2015-03-06 (×4): 10 meq via ORAL
  Filled 2015-03-04 (×4): qty 1

## 2015-03-04 MED ORDER — DIVALPROEX SODIUM ER 500 MG PO TB24: 750.0000 mg | ORAL_TABLET | Freq: Every day | ORAL | Status: DC

## 2015-03-04 NOTE — Tx Team (Signed)
Interdisciplinary Treatment Plan Update (Adult) Date: 03/04/2015   Date: 03/04/2015 9:52 AM  Progress in Treatment:  Attending groups: Pt is new to milieu, continuing to assess  Participating in groups: Pt is new to milieu, continuing to assess  Taking medication as prescribed: Yes  Tolerating medication: Yes  Family/Significant othe contact made: No, CSW assessing for appropriate contact Patient understands diagnosis: Continuing to assess Discussing patient identified problems/goals with staff: Yes  Medical problems stabilized or resolved: Yes  Denies suicidal/homicidal ideation: Yes Patient has not harmed self or Others: Yes   New problem(s) identified: None identified at this time.   Discharge Plan or Barriers: CSW will assess for appropriate discharge plan and relevant barriers.   Additional comments:  Patient and CSW reviewed pt's identified goals and treatment plan. Patient verbalized understanding and agreed to treatment plan. CSW reviewed [facility] "Discharge Process and Patient Involvement" Form. Pt verbalized understanding of information provided and signed form.   Reason for Continuation of Hospitalization:  Depression Medication stabilization Suicidal ideation  Estimated length of stay: 3-5 days  Review of initial/current patient goals per problem list:   1.  Goal(s): Patient will participate in aftercare plan  Met:  No  Target date: 3-5 days from date of admission   As evidenced by: Patient will participate within aftercare plan AEB aftercare provider and housing plan at discharge being identified.  03/04/15: CSW to work with Pt to assess for appropriate discharge plan and faciliate appointments and referrals as needed prior to d/c.  2.  Goal (s): Patient will exhibit decreased depressive symptoms and suicidal ideations.  Met:  No  Target date: 3-5 days from date of admission   As evidenced by: Patient will utilize self rating of depression at 3 or below and  demonstrate decreased signs of depression or be deemed stable for discharge by MD. 03/04/15: Pt was admitted with symptoms of depression, rating 10/10. Pt continues to present with flat affect and depressive symptoms.  Pt will demonstrate decreased symptoms of depression and rate depression at 3/10 or lower prior to discharge.  Attendees:  Patient:    Family:    Physician: Dr. Parke Poisson, MD  03/04/2015 9:52 AM  Nursing: Lars Pinks, RN Case manager  03/04/2015 9:52 AM  Clinical Social Worker Peri Maris, [place] 03/04/2015 9:52 AM  Other: Tilden Fossa, [place] 03/04/2015 9:52 AM  Clinical:  Darrol Angel, RN 03/04/2015 9:52 AM  Other: , RN Charge Nurse 03/04/2015 9:52 AM  Other: Hilda Lias, [place], [place] Social Work [phone]
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
## 2015-03-04 NOTE — BHH Suicide Risk Assessment (Signed)
[facility] Admission Suicide Risk Assessment   Nursing information obtained from:  Patient Demographic factors:  Low socioeconomic status Current Mental Status:  NA Loss Factors:  NA Historical Factors:  NA Risk Reduction Factors:  Responsible for children under 46 years of age, Sense of responsibility to family, Employed, Living with another person, especially a relative Total Time spent with patient: 30 minutes Principal Problem: Bipolar disorder, curr episode mixed, severe, with psychotic features ([place]) Diagnosis:   Patient Active Problem List   Diagnosis Date Noted  . Bipolar disorder, curr episode mixed, severe, with psychotic features ([place]) [F31.64] 03/04/2015  . Cannabis use disorder, severe, dependence ([place]) [F12.20] 03/04/2015  . Cocaine use disorder, severe, dependence ([place]) [F14.20] 03/03/2015  . Pneumothorax on right [J93.9] 01/02/2013  . Acid reflux [K21.9] 07/08/2012  . Malignant neoplasm of thyroid gland ([place]) [C73] 07/08/2012  . Other abnormal glucose [R73.09] 09/02/2011  . Plantar fasciitis, bilateral [M72.2] 07/22/2011  . Neuropathic pain of thigh, right [G57.91] 07/22/2011  . 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 Thyroid cancer ([place]) [C73] 04/27/2011     Continued Clinical Symptoms:  Alcohol Use Disorder Identification Test Final Score (AUDIT): 9 The "Alcohol Use Disorders Identification Test", Guidelines for Use in Primary Care, Second Edition.  World Pharmacologist [facility]). Score between 0-7:  no or low risk or alcohol related problems. Score between 8-15:  moderate risk of alcohol related problems. Score between 16-19:  high risk of alcohol related problems. Score 20 or above:  warrants further diagnostic evaluation for alcohol dependence and treatment.   CLINICAL FACTORS:   Alcohol/Substance Abuse/Dependencies Unstable or Poor Therapeutic Relationship Previous Psychiatric Diagnoses and Treatments   Musculoskeletal: Strength & Muscle Tone:  within normal limits Gait & Station: normal Patient leans: N/A  Psychiatric Specialty Exam: Physical Exam  Review of Systems  Psychiatric/Behavioral: Positive for depression, suicidal ideas, hallucinations and substance abuse. The patient is nervous/anxious.   All other systems reviewed and are negative.   Blood pressure 106/72, pulse 82, temperature 97.6 F (36.4 C), temperature source Oral, resp. rate 16, height 6\' 2"  (1.88 m), weight 108.863 kg (240 lb), SpO2 98 %.Body mass index is 30.8 kg/(m^2).  General Appearance: Casual  Eye Contact::  Fair  Speech:  Normal Rate  Volume:  Normal  Mood:  Anxious, Depressed and Irritable  Affect:  Labile  Thought Process:  Goal Directed  Orientation:  Full (Time, Place, and Person)  Thought Content:  Hallucinations: Auditory mumbles , also has VH os seeing things- does not elaborate, Paranoid Ideation and Rumination  Suicidal Thoughts:  yes on admission- currently denies it  Homicidal Thoughts:  No  Memory:  Immediate;   Fair Recent;   Fair Remote;   Fair  Judgement:  Impaired  Insight:  Shallow  Psychomotor Activity:  Normal  Concentration:  Poor  Recall:  [employer] of Knowledge:Fair  Language: Fair  Akathisia:  No  Handed:  Right  AIMS (if indicated):     Assets:  Desire for Improvement  Sleep:  Number of Hours: 5.5  Cognition: WNL  ADL's:  Intact     COGNITIVE FEATURES THAT CONTRIBUTE TO RISK:  Closed-mindedness, Polarized thinking and Thought constriction (tunnel vision)    SUICIDE RISK:   Moderate:  Frequent suicidal ideation with limited intensity, and duration, some specificity in terms of plans, no associated intent, good self-control, limited dysphoria/symptomatology, some risk factors present, and identifiable protective factors, including available and accessible social support.  PLAN OF CARE: Patient reports a hx of Bipolar disorder ,  has mixed episodes , also has several psychosocial stressors like financial  issues, big family and having to support them. Pt also with relapse on cocaine and cannabis. Will continue treatment. Patient will benefit from inpatient treatment and stabilization. Case discussed with Aggie NP - please also see H&p. Estimated length of stay is 5-7 days.  Reviewed past medical records,treatment plan.  Will DC Latuda for lack of efficacy as well as prolonged qtc. Will start Haldol 2 mg po qhs for psychosis as well as mood sx. Will monitor EKG for qtc while he is here. Will add Depakote ER 750 mg po qhs for mood sx- will get hepatic panel as well as CBC prior to starting trial.Depakote level in 5 days. Will add PRN medications as per agitation protocol. Will continue to monitor vitals ,medication compliance and treatment side effects while patient is here.  Will monitor for medical issues as well as call consult as needed.  Reviewed labs cbc - wnl , cmp - shows creatinine slightly elevated - will monitor , will replace K+ if not already done, UDS- Pos for cocaine, THC ,ekg- qtc prolonged- will monitor. CSW will start working on disposition.  Patient to participate in therapeutic milieu .       Medical Decision Making:  Review of Psycho-Social Stressors (1), Decision to obtain old records (1), Review and summation of old records (2), Established Problem, Worsening (2), Review of Last Therapy Session (1), Review of Medication Regimen & Side Effects (2) and Review of New Medication or Change in Dosage (2)  I certify that inpatient services furnished can reasonably be expected to improve the patient's condition.   Laiden Milles MD 03/04/2015, 2:40 PM

## 2015-03-04 NOTE — Progress Notes (Signed)
D. Pt had been up and visible in milieu this evening, did attend and participate in evening group activity. Pt did speak about some on-gong depression and how he had been off his medications for awhile because it had been making him drowsy. Pt did receive bedtime latuda without incident but did not want any other medications and spoke about how he had been sleeping ok but if he needed the sleep medication he would come and ask. Pt also did not verbalize any complaints of pain this evening. A. Support and encouragement provided. R. Safety maintained, will continue to monitor.

## 2015-03-04 NOTE — Progress Notes (Signed)
Adult Psychoeducational Group Note  Date:  03/04/2015 Time:  10:41 PM  Group Topic/Focus:  Wrap-Up Group:   The focus of this group is to help patients review their daily goal of treatment and discuss progress on daily workbooks.  Participation Level:  Active  Participation Quality:  Appropriate and Attentive  Affect:  Appropriate  Cognitive:  Appropriate  Insight: Appropriate and Good  Engagement in Group:  Engaged  Modes of Intervention:  Education  Additional Comments:  Pt had a good day. Pt goal for tomorrow is to decide if he wants to move to a new location and start work or stay where he currently lives.   Jerline Pain 03/04/2015, 10:41 PM

## 2015-03-04 NOTE — Progress Notes (Signed)
D: Pt presents animated this morning. Pt responded vaguely during shift assessment. Pt stated, "I'm feeling good beautiful". No complaints verbalized by pt. Pt stated on his self inventory sheet, depression 7/10. Hopeless 7/10. Anxiety 6/10. Pt denies suicidal thoughts. Pt reports fair sleep and appetite. Pt stated goal, "planning". Pt appears guarded and cautious during shift assessment. Pt forwarded little information.  A: Medications administered as ordered per MD. Verbal support given. Pt encouraged to attend groups. 15 minute checks performed for safety. R: Pt receptive to tx.

## 2015-03-04 NOTE — BHH Group Notes (Signed)
[facility] LCSW Aftercare Discharge Planning Group Note  03/04/2015 8:45 AM  Pt did not attend, declined invitation.   Peri Maris, [place] 03/04/2015 9:40 AM
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
## 2015-03-04 NOTE — BHH Group Notes (Signed)
[facility] LCSW Group Therapy  03/04/2015 1:15pm  Type of Therapy:  Group Therapy vercoming Obstacles  Participation Level:  Reserved  Participation Quality:  Appropriate   Affect:  Appropriate  Cognitive:  Appropriate and Oriented  Insight:  Developing/Improving and Improving  Engagement in Therapy:  Improving  Modes of Intervention:  Discussion, Exploration, Problem-solving and Support  Description of Group:   In this group patients will be encouraged to explore what they see as obstacles to their own wellness and recovery. They will be guided to discuss their thoughts, feelings, and behaviors related to these obstacles. The group will process together ways to cope with barriers, with attention given to specific choices patients can make. Each patient will be challenged to identify changes they are motivated to make in order to overcome their obstacles. This group will be process-oriented, with patients participating in exploration of their own experiences as well as giving and receiving support and challenge from other group members.  Summary of Patient Progress: Pt required prompting but participated appropriately. He expressed that his addiction was an obstacle in his life which made relationships and finances difficult. Pt was observed to be attentive to discussion and interacted well with peers.   Therapeutic Modalities:   Cognitive Behavioral Therapy Solution Focused Therapy Motivational Interviewing Relapse Prevention Therapy   Peri Maris, LCSWA 03/04/2015 3:08 PM

## 2015-03-05 DIAGNOSIS — F1220 Cannabis dependence, uncomplicated: Secondary | ICD-10-CM | POA: Diagnosis not present

## 2015-03-05 DIAGNOSIS — F3164 Bipolar disorder, current episode mixed, severe, with psychotic features: Secondary | ICD-10-CM | POA: Diagnosis not present

## 2015-03-05 DIAGNOSIS — F1420 Cocaine dependence, uncomplicated: Secondary | ICD-10-CM | POA: Diagnosis not present

## 2015-03-05 DIAGNOSIS — F1494 Cocaine use, unspecified with cocaine-induced mood disorder: Secondary | ICD-10-CM | POA: Diagnosis not present

## 2015-03-05 LAB — LIPID PANEL
CHOLESTEROL: 168 mg/dL (ref 0–200)
HDL: 39 mg/dL — ABNORMAL LOW (ref >40)
LDL CALC: 87 mg/dL (ref 0–99)
Total CHOL/HDL Ratio: 4.3 RATIO
Triglycerides: 209 mg/dL — ABNORMAL HIGH (ref <150)
VLDL: 42 mg/dL — AB (ref 0–40)

## 2015-03-05 NOTE — BHH Suicide Risk Assessment (Signed)
[facility] INPATIENT:  Family/Significant Other Suicide Prevention Education  Suicide Prevention Education:  Education Completed; Teshomi Going, Pt's wife [phone],  has been identified by the patient as the family member/significant other with whom the patient will be residing, and identified as the person(s) who will aid the patient in the event of a mental health crisis (suicidal ideations/suicide attempt).  With written consent from the patient, the family member/significant other has been provided the following suicide prevention education, prior to the and/or following the discharge of the patient.  The suicide prevention education provided includes the following:  Suicide risk factors  Suicide prevention and interventions  National Suicide Hotline telephone number  [facility] assessment telephone number  [facility] Emergency Assistance [place] and/or Residential Mobile Crisis Unit telephone number  Request made of family/significant other to:  Remove weapons (e.g., guns, rifles, knives), all items previously/currently identified as safety concern.    Remove drugs/medications (over-the-counter, prescriptions, illicit drugs), all items previously/currently identified as a safety concern.  The family member/significant other verbalizes understanding of the suicide prevention education information provided.  The family member/significant other agrees to remove the items of safety concern listed above.  Bo Mcclintock 03/05/2015, 4:56 PM

## 2015-03-05 NOTE — Progress Notes (Signed)
Recreation Therapy Notes  Animal-Assisted Activity (AAA) Program Checklist/Progress Notes Patient Eligibility Criteria Checklist & Daily Group note for Rec Tx Intervention  Date: 12.20.2016  Time: 2:45pm Location: [address]   AAA/T Program Assumption of Risk Form signed by Patient/ or Parent Legal Guardian yes  Patient is free of allergies or sever asthma yes  Patient reports no fear of animals yes  Patient reports no history of cruelty to animals yes  Patient understands his/her participation is voluntary yes  Patient washes hands before animal contact yes  Patient washes hands after animal contact yes  Behavioral Response: Appropriate   Education: Hand Washing, Appropriate Animal Interaction   Education Outcome: Acknowledges education.   Clinical Observations/Feedback: Patient attended session and engaged appropriately with peers and therapy dog team.   Lane Hacker, LRT/CTRS        Lane Hacker 03/05/2015 3:58 PM

## 2015-03-05 NOTE — Progress Notes (Signed)
D: Pt presents with flat affect. Pt appears to be minimizes his symptoms. Pt continues to deny depression verbally to writer but on his self inventory sheet, rates depression 6/10. Anxiety 7/10. Hopeless. Pt denies suicidal thoughts. Pt reports fair sleep and appetite. Pt denies withdrawing from cocaine. Pt verbalized to writer that he's ready to return home to his wife and newborn baby. Pt compliant with taking meds. No adverse reaction to meds verbalized by pt. A: Medications administered as ordered per MD. Verbal support given. Pt encouraged to attend groups. 15 minute checks performed for safety. R: Pt safety maintained. Pt receptive to tx.

## 2015-03-05 NOTE — Progress Notes (Signed)
Adult Psychoeducational Group Note  Date:  03/05/2015 Time:  11:57 PM  Group Topic/Focus:  Wrap-Up Group:   The focus of this group is to help patients review their daily goal of treatment and discuss progress on daily workbooks.  Participation Level:  Active  Participation Quality:  Attentive  Affect:  Appropriate  Cognitive:  Appropriate  Insight: Appropriate and Good  Engagement in Group:  Engaged  Modes of Intervention:  Education  Additional Comments:  Pt overall had a good day/ Pt goal for tomorrow is talk with someone about discharging.   Jerline Pain 03/05/2015, 11:57 PM

## 2015-03-05 NOTE — BHH Group Notes (Signed)
[facility] LCSW Group Therapy 03/05/2015 1:15 PM  Type of Therapy: Group Therapy- Feelings about Diagnosis  Pt did not attend, declined invitation.   Peri Maris, [place] 03/05/2015 3:55 PM

## 2015-03-05 NOTE — Progress Notes (Signed)
Pt reported that he had a cell phone from admission. Pt asked for cell phone to be charged, cell phone was taken from pt by this writer and instructed about unit rules. This Probation officer documented cell phone on search and belongings sheet and locked it up in pt belongings in locker.
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
## 2015-03-06 DIAGNOSIS — F1494 Cocaine use, unspecified with cocaine-induced mood disorder: Secondary | ICD-10-CM | POA: Diagnosis not present

## 2015-03-06 DIAGNOSIS — F1420 Cocaine dependence, uncomplicated: Secondary | ICD-10-CM | POA: Diagnosis not present

## 2015-03-06 DIAGNOSIS — F3164 Bipolar disorder, current episode mixed, severe, with psychotic features: Secondary | ICD-10-CM | POA: Diagnosis not present

## 2015-03-06 DIAGNOSIS — F1220 Cannabis dependence, uncomplicated: Secondary | ICD-10-CM | POA: Diagnosis not present

## 2015-03-06 LAB — PROLACTIN: PROLACTIN: 18.3 ng/mL — AB (ref 4.0–15.2)

## 2015-03-06 NOTE — [facility] Group Notes (Signed)
[facility] LCSW Aftercare Discharge Planning Group Note  03/06/2015 8:45 AM  Participation Quality: Alert, Appropriate and Oriented  Mood/Affect: Appropriate  Depression Rating: 0  Anxiety Rating: 0  Thoughts of Suicide: Pt denies SI/HI  Will you contract for safety? Yes  Current AVH: Pt denies  Plan for Discharge/Comments: Pt attended discharge planning group and actively participated in group. CSW discussed suicide prevention education with the group and encouraged them to discuss discharge planning and any relevant barriers. Pt reports feeling ready for discharge; no needs expressed at this time.  Transportation Means: Pt reports access to transportation  Supports: No supports mentioned at this time  Peri Maris, [place] 03/06/2015 4:16 PM

## 2015-03-06 NOTE — BHH Group Notes (Signed)
[facility] [place] LCSW Group Therapy 03/06/2015 1:15 PM  Type of Therapy: Group Therapy- Emotion Regulation  Participation Level: Minimal  Participation Quality:  Attentive  Affect: Appropriate  Cognitive: Alert and Oriented   Insight:  Developing/Improving  Engagement in Therapy: Developing/Improving and Engaged   Modes of Intervention: Clarification, Confrontation, Discussion, Education, Exploration, Limit-setting, Orientation, Problem-solving, Rapport Building, Art therapist, Socialization and Support  Summary of Progress/Problems: The topic for group today was emotional regulation. This group focused on both positive and negative emotion identification and allowed group members to process ways to identify feelings, regulate negative emotions, and find healthy ways to manage internal/external emotions. Group members were asked to reflect on a time when their reaction to an emotion led to a negative outcome and explored how alternative responses using emotion regulation would have benefited them. Group members were also asked to discuss a time when emotion regulation was utilized when a negative emotion was experienced. Pt was attentive to discussion but did not participate verbally.   Peri Maris, LCSWA 03/06/2015 4:13 PM

## 2015-03-06 NOTE — Plan of Care (Signed)
Problem: Alteration in mood Goal: LTG-Patient reports reduction in suicidal thoughts (Patient reports reduction in suicidal thoughts and is able to verbalize a safety plan for whenever patient is feeling suicidal)  Outcome: Progressing Pt denies SI at this time     

## 2015-03-06 NOTE — Progress Notes (Signed)
Adult Psychoeducational Group Note  Date:  03/06/2015 Time:  9:57 PM  Group Topic/Focus:  Wrap-Up Group:   The focus of this group is to help patients review their daily goal of treatment and discuss progress on daily workbooks.  Participation Level:  Active  Participation Quality:  Appropriate  Affect:  Appropriate  Cognitive:  Alert  Insight: Appropriate  Engagement in Group:  Engaged  Modes of Intervention:  Discussion  Additional Comments:  Pt stated that he had a good day and he is looking forward to going home tomorrow.   Wynelle Fanny R 03/06/2015, 9:57 PM
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
## 2015-03-06 NOTE — Progress Notes (Signed)
Recreation Therapy Notes  Date: 12.21.2016  Time: 9:30am Location: [address] Group room   Group Topic: Stress Management  Goal Area(s) Addresses:  Patient will actively participate in stress management techniques presented during session.   Behavioral Response: Did not attend.   Laureen Ochs Phuc Kluttz, LRT/CTRS        Ottie Tillery L 03/06/2015 11:47 AM

## 2015-03-06 NOTE — Progress Notes (Signed)
D: Pt denies SI/HI/AVH. Pt is pleasant and cooperative. Pt stated him and his wife plan to move into a house in [place] next week. 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   A: Pt was offered support and encouragement. Pt was given scheduled medications. Pt was encourage to attend groups. Q 15 minute checks were done for safety.   R:Pt attends groups and interacts well with peers and staff. Pt is taking medication. Pt has no complaints.Pt receptive to treatment and safety maintained on unit.
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
## 2015-03-07 DIAGNOSIS — F3164 Bipolar disorder, current episode mixed, severe, with psychotic features: Secondary | ICD-10-CM | POA: Diagnosis not present

## 2015-03-07 NOTE — [facility] Group Notes (Signed)
[facility] Group Notes:  (Nursing/MHT/Case Management/Adjunct)  Date:  03/07/2015  Time:  10:54 AM  Type of Therapy:  Nurse Education  Participation Level:  Did Not Attend  Participation Quality:    Affect:    Cognitive:    Insight:    Engagement in Group:    Modes of Intervention:  Discussion and Education  Summary of Progress/Problems:  Group topic today is Lifestyle and Leisure changes. Talked about coping skills. Chazz chose not to attend group.  Barbette Or Cassey Hurrell 03/07/2015, 10:54 AM

## 2015-03-07 NOTE — Tx Team (Signed)
Interdisciplinary Treatment Plan Update (Adult) Date: 03/07/2015   Date: 03/07/2015 11:56 AM  Progress in Treatment:  Attending groups: Yes Participating in groups: Minimally  Taking medication as prescribed: Yes  Tolerating medication: Yes  Family/Significant othe contact made: Yes, with wife Patient understands diagnosis: Yes Discussing patient identified problems/goals with staff: Yes  Medical problems stabilized or resolved: Yes  Denies suicidal/homicidal ideation: Yes Patient has not harmed self or Others: Yes   New problem(s) identified: None identified at this time.   Discharge Plan or Barriers: Pt will return home and follow-up with outpatient services.   Additional comments:  Patient and CSW reviewed pt's identified goals and treatment plan. Patient verbalized understanding and agreed to treatment plan. CSW reviewed [facility] "Discharge Process and Patient Involvement" Form. Pt verbalized understanding of information provided and signed form.   Reason for Continuation of Hospitalization:  Depression Medication stabilization Suicidal ideation  Estimated length of stay: 1-2 days  Review of initial/current patient goals per problem list:   1.  Goal(s): Patient will participate in aftercare plan  Met:  Yes  Target date: 3-5 days from date of admission   As evidenced by: Patient will participate within aftercare plan AEB aftercare provider and housing plan at discharge being identified.  03/04/15: CSW to work with Pt to assess for appropriate discharge plan and faciliate appointments and referrals as needed prior to d/c. 03/07/15: Pt will return home and follow-up with [facility].  2.  Goal (s): Patient will exhibit decreased depressive symptoms and suicidal ideations.  Met:  Yes  Target date: 3-5 days from date of admission   As evidenced by: Patient will utilize self rating of depression at 3 or below and demonstrate decreased signs of depression or be deemed stable for  discharge by MD. 03/04/15: Pt was admitted with symptoms of depression, rating 10/10. Pt continues to present with flat affect and depressive symptoms.  Pt will demonstrate decreased symptoms of depression and rate depression at 3/10 or lower prior to discharge. 03/07/15: Pt rates depression at 0/10; denies SI  Attendees:  Patient:    Family:    Physician: Dr. Shea Evans, MD  03/07/2015 11:56 AM  Nursing: Lars Pinks, RN Case manager  03/07/2015 11:56 AM  Clinical Social Worker Peri Maris, [place] 03/07/2015 11:56 AM  Other: Tilden Fossa, [place] 03/07/2015 11:56 AM  Clinical:  Manuella Ghazi, RN 03/07/2015 11:56 AM  Other: , RN Charge Nurse 03/07/2015 11:56 AM  Other:     Peri Maris, [place] Social Work [phone]

## 2015-03-07 NOTE — Progress Notes (Signed)
D: Pt denies SI/HI/AVH. Pt is pleasant and cooperative.   A: Pt was offered support and encouragement. Pt was given scheduled medications. Pt was encourage to attend groups. Q 15 minute checks were done for safety.   R:Pt attends groups and interacts well with peers and staff. Pt is taking medication. Pt has no complaints at this time .Pt receptive to treatment and safety maintained on unit.
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
## 2015-03-07 NOTE — Plan of Care (Signed)
Problem: Alteration in mood Goal: LTG-Pt's behavior demonstrates decreased signs of depression (Patient's behavior demonstrates decreased signs of depression to the point the patient is safe to return home and continue treatment in an outpatient setting)  Outcome: Progressing Pt seen on the unit laughing and playing cards with peers in the dayroom

## 2015-03-07 NOTE — Progress Notes (Signed)
Pt participated in a game for wrap-up group.

## 2015-03-07 NOTE — Plan of Care (Signed)
Problem: Food- and Nutrition-Related Knowledge Deficit (NB-1.1) Goal: Nutrition education Formal process to instruct or train a patient/client in a skill or to impart knowledge to help patients/clients voluntarily manage or modify food choices and eating behavior to maintain or improve health. Outcome: Completed/Met Date Met:  03/07/15 Nutrition Education Note  RD consulted for nutrition education regarding hyperlipidemia.  Lipid Panel     Component Value Date/Time    CHOL 168 03/05/2015 0615    TRIG 209* 03/05/2015 0615    HDL 39* 03/05/2015 0615    CHOLHDL 4.3 03/05/2015 0615    VLDL 42* 03/05/2015 0615    LDLCALC 87 03/05/2015 0615    RD provided "High Triglycerides Nutrition Therapy" handout from the Academy of Nutrition and Dietetics. Reviewed patient's dietary recall. Provided examples on ways to decrease sugar and fat intake in diet. Discouraged intake of processed foods and consumption of sugar-sweetened beverages. Encouraged fresh fruits and vegetables as well as whole grain sources of carbohydrates to maximize fiber intake. Teach back method used.  Expect fair compliance. Pt just woke up and did not have many questions for RD.  Body mass index is 30.8 kg/(m^2). Pt meets criteria for obesity based on current BMI.  Diet Order: Diet Heart Room service appropriate?: Yes; Fluid consistency:: Thin Pt is also offered choice of unit snacks mid-morning and mid-afternoon.  Pt is eating as desired.   Labs and medications reviewed. No further nutrition interventions warranted at this time. If additional nutrition issues arise, please re-consult RD.  Clayton Bibles, MS, RD, LDN Pager: [phone] After Hours Pager: [phone]

## 2015-03-07 NOTE — Progress Notes (Signed)
D:Patient in the dayroom on approach.  Patient states he had a good day.  Patient states he hopes to be discharged tomorrow. Patient states while here he has learned that he needs medication.  Patient states he is going to continue to take his medication when discharged.  Patient states he has good family support. Patient denies SI/HI and denies AVH. A: Staff to monitor Q 15 mins for safety.  Encouragement and support offered.  Scheduled medications administered per orders.  Albuterol inhaler administered prn for SOB. R: Patient remains safe on the unit.  Patient attended group tonight.  Patient visible on the unit and interacting with peers.  Patient taking administered medications. SOB resolved.

## 2015-03-07 NOTE — Progress Notes (Signed)
DAR Note: Nicholas Mccarty has been visible on the unit.  He has been in the day room much of the day.  Playing cards with peers.  He denies SI/HI or A/V  Hallucinations.  He denies any pain or discomfort and appears to be in no physical distress.  He completed his self inventory and reports that his depression and hopelessness are 0/10 and anxiety 2/10.  He states that his goal for today is to be "released" and he will accomplish this goal by "praying."  Encouraged participation in group and unit activities.  Q 15 minute checks maintained for safety.  We will continue to monitor the progress towards his goals.

## 2015-03-07 NOTE — [facility] Group Notes (Signed)
[facility] LCSW Group Therapy 03/07/2015 1:15pm  Type of Therapy: Group Therapy- [facility]  Pt did not attend, declined invitation.   Peri Maris, LCSWA 03/07/2015 6:10 PM

## 2015-03-08 DIAGNOSIS — F1220 Cannabis dependence, uncomplicated: Secondary | ICD-10-CM

## 2015-03-08 DIAGNOSIS — F1420 Cocaine dependence, uncomplicated: Secondary | ICD-10-CM | POA: Diagnosis not present

## 2015-03-08 DIAGNOSIS — F3164 Bipolar disorder, current episode mixed, severe, with psychotic features: Secondary | ICD-10-CM | POA: Diagnosis not present

## 2015-03-08 LAB — VALPROIC ACID LEVEL: VALPROIC ACID LVL: 39 ug/mL — AB (ref 50.0–100.0)

## 2015-03-08 MED ORDER — BENZTROPINE MESYLATE 0.5 MG PO TABS: 0.5000 mg | ORAL_TABLET | Freq: Every day | ORAL | Status: DC

## 2015-03-08 MED ORDER — ALBUTEROL SULFATE HFA 108 (90 BASE) MCG/ACT IN AERS: 1.0000 | INHALATION_SPRAY | Freq: Four times a day (QID) | RESPIRATORY_TRACT | Status: DC | PRN

## 2015-03-08 MED ORDER — HALOPERIDOL 2 MG PO TABS: 2.0000 mg | ORAL_TABLET | Freq: Every day | ORAL | Status: DC

## 2015-03-08 MED ORDER — LORATADINE 10 MG PO TABS: 10.0000 mg | ORAL_TABLET | Freq: Every day | ORAL | Status: DC

## 2015-03-08 MED ORDER — LEVOTHYROXINE SODIUM 200 MCG PO TABS: 200.0000 ug | ORAL_TABLET | Freq: Every day | ORAL | Status: DC

## 2015-03-08 MED ORDER — DIVALPROEX SODIUM ER 500 MG PO TB24
1000.0000 mg | ORAL_TABLET | Freq: Every day | ORAL | Status: DC
  Filled 2015-03-08: qty 2
  Filled 2015-03-08: qty 14

## 2015-03-08 MED ORDER — BENZTROPINE MESYLATE 0.5 MG PO TABS
0.5000 mg | ORAL_TABLET | Freq: Every day | ORAL | Status: DC
Start: 2015-03-08 — End: 2015-10-17

## 2015-03-08 MED ORDER — ALBUTEROL SULFATE (2.5 MG/3ML) 0.083% IN NEBU: 2.5000 mg | INHALATION_SOLUTION | RESPIRATORY_TRACT | Status: DC | PRN

## 2015-03-08 MED ORDER — DIVALPROEX SODIUM ER 500 MG PO TB24: 1000.0000 mg | ORAL_TABLET | Freq: Every day | ORAL | Status: DC

## 2015-03-08 MED ORDER — RANITIDINE HCL 150 MG PO TABS: 150.0000 mg | ORAL_TABLET | Freq: Two times a day (BID) | ORAL | Status: DC

## 2015-03-08 MED ORDER — HYDROXYZINE HCL 25 MG PO TABS: 25.0000 mg | ORAL_TABLET | Freq: Three times a day (TID) | ORAL | Status: DC | PRN

## 2015-03-08 NOTE — Progress Notes (Signed)
  [facility] [place] Adult Case Management Discharge Plan :  Will you be returning to the same living situation after discharge:  Yes,  Pt returning home At discharge, do you have transportation home?: Yes,  wife to pick up Do you have the ability to pay for your medications: Yes,  Pt provided with 7-day supply and prescriptions  Release of information consent forms completed and in the chart;  Patient's signature needed at discharge.  Patient to Follow up at: Follow-up Information    [facility]   Specialty:  Behavioral Health   Why:  Please walk-in any Tuesday to be assessed for the substance abuse intensive outpatient program and medication management. Walk-in hours 8am-12pm   Contact information:   [address] [place] [address] [phone]       Follow up with [facility] On 03/08/2015.   Why:  Please walk-in between 8am-3pm to be seen for your initial assessment for services.   Contact information:   [address], [place] [address]  Phone: [phone]  Fax: [phone]      Next level of care provider has access to [place] and Suicide Prevention discussed: Yes,  with wife; see SPE note for further details  Have you used any form of tobacco in the last 30 days? (Cigarettes, Smokeless Tobacco, Cigars, and/or Pipes): No  Has patient been referred to the Quitline?: N/A patient is not a smoker  Patient has been referred for addiction treatment: Yes- see above  Bo Mcclintock 03/08/2015, 10:05 AM

## 2015-03-08 NOTE — [facility] Suicide Risk Assessment (Signed)
[facility] Discharge Suicide Risk Assessment   Demographic Factors:  Male  Total Time spent with patient: 30 minutes  Musculoskeletal: Strength & Muscle Tone: within normal limits Gait & Station: normal Patient leans: N/A  Psychiatric Specialty Exam: Physical Exam  Review of Systems  Psychiatric/Behavioral: Positive for substance abuse. Negative for depression, suicidal ideas and hallucinations.  All other systems reviewed and are negative.   Blood pressure 118/83, pulse 81, temperature 97.6 F (36.4 C), temperature source Oral, resp. rate 16, height 6\' 2"  (1.88 m), weight 108.863 kg (240 lb), SpO2 98 %.Body mass index is 30.8 kg/(m^2).  General Appearance: Casual  Eye Contact::  Fair  Speech:  Clear and [id number]  Volume:  Normal  Mood:  Euthymic  Affect:  Appropriate  Thought Process:  Coherent  Orientation:  Full (Time, Place, and Person)  Thought Content:  WDL  Suicidal Thoughts:  No  Homicidal Thoughts:  No  Memory:  Immediate;   Fair Recent;   Fair Remote;   Fair  Judgement:  Fair  Insight:  Fair  Psychomotor Activity:  Normal  Concentration:  Fair  Recall:  [employer] of Knowledge:Fair  Language: Fair  Akathisia:  No  Handed:  Right  AIMS (if indicated):     Assets:  Desire for Improvement  Sleep:  Number of Hours: 5  Cognition: WNL  ADL's:  Intact   Have you used any form of tobacco in the last 30 days? (Cigarettes, Smokeless Tobacco, Cigars, and/or Pipes): No  Has this patient used any form of tobacco in the last 30 days? (Cigarettes, Smokeless Tobacco, Cigars, and/or Pipes) No  Mental Status Per Nursing Assessment::   On Admission:  NA  Current Mental Status by Physician: pt denies SI/HI/AH/VH  Loss Factors: NA  Historical Factors: Impulsivity  Risk Reduction Factors:   Positive social support  Continued Clinical Symptoms:  Alcohol/Substance Abuse/Dependencies Previous Psychiatric Diagnoses and Treatments  Cognitive Features That Contribute  To Risk:  Polarized thinking    Suicide Risk:  Minimal: No identifiable suicidal ideation.  Patients presenting with no risk factors but with morbid ruminations; may be classified as minimal risk based on the severity of the depressive symptoms  Principal Problem: Bipolar disorder, curr episode mixed, severe, with psychotic features [facility]) Discharge Diagnoses:  Patient Active Problem List   Diagnosis Date Noted  . Bipolar disorder, curr episode mixed, severe, with psychotic features ([place]) [F31.64] 03/04/2015  . Cannabis use disorder, severe, dependence ([place]) [F12.20] 03/04/2015  . Hypokalemia [E87.6] 03/04/2015  . Cocaine use disorder, severe, dependence ([place]) [F14.20] 03/03/2015  . Pneumothorax on right [J93.9] 01/02/2013  . Acid reflux [K21.9] 07/08/2012  . Malignant neoplasm of thyroid gland ([place]) [C73] 07/08/2012  . Other abnormal glucose [R73.09] 09/02/2011  . Plantar fasciitis, bilateral [M72.2] 07/22/2011  . Neuropathic pain of thigh, right [G57.91] 07/22/2011  . 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 Thyroid cancer ([place]) [C73] 04/27/2011    Follow-up Information    [facility] with [facility].   Specialty:  Behavioral Health   Why:  Please walk-in any Tuesday to be assessed for the substance abuse intensive outpatient program and medication management. Walk-in hours 8am-12pm   Contact information:   [address] [place] [address] [phone]       Follow up with [facility] On 03/08/2015.   Why:  Please walk-in between 8am-3pm to be seen for your initial assessment for services.   Contact information:   [address]  [place], [place] [address]  Phone: [phone]  Fax: [phone]      Plan Of Care/Follow-up recommendations:  Activity:  No restrictions Diet:  regular Tests:  Follow up on depakote level on 03/12/15 and follow upon prolactin level as per out p[t recommendations Other:  none  Is patient on  multiple antipsychotic therapies at discharge:  No   Has Patient had three or more failed trials of antipsychotic monotherapy by history:  No  Recommended Plan for Multiple Antipsychotic Therapies: NA    Deundre Thong md 03/08/2015, 9:41 AM
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
## 2015-03-08 NOTE — Progress Notes (Signed)
D) Pt being discharged to home accompanied by his wife. Affect and mood are appropriate. Pt denies SI and HI. Rates his depression, anxiety and hopelessness all at a 0. States, "I am really doing fine".  A) Pt given support, reassurance and praise. Encouragement given. All belongings returned to Pt. All medications and follow up plans explained to Pt.  R) Pt denies SI and HI.

## 2015-03-08 NOTE — Plan of Care (Signed)
Problem: Alteration in mood Goal: LTG-Patient reports reduction in suicidal thoughts (Patient reports reduction in suicidal thoughts and is able to verbalize a safety plan for whenever patient is feeling suicidal)  Outcome: Progressing Patient denies SI.  Patient verbally contracts for safety.     

## 2015-03-14 ENCOUNTER — Emergency Department (HOSPITAL_COMMUNITY)
Admission: EM | Admit: 2015-03-14 | Discharge: 2015-03-15 | Disposition: A | Discharge status: Home / Self Care | Payer: Federal, State, Local not specified - Other | Attending: Emergency Medicine | Admitting: Emergency Medicine

## 2015-03-14 ENCOUNTER — Other Ambulatory Visit: Payer: Self-pay

## 2015-03-14 ENCOUNTER — Encounter (HOSPITAL_COMMUNITY): Payer: Self-pay | Admitting: Emergency Medicine

## 2015-03-14 ENCOUNTER — Emergency Department (HOSPITAL_COMMUNITY): Payer: Self-pay

## 2015-03-14 DIAGNOSIS — F1494 Cocaine use, unspecified with cocaine-induced mood disorder: Secondary | ICD-10-CM | POA: Diagnosis not present

## 2015-03-14 DIAGNOSIS — Z8709 Personal history of other diseases of the respiratory system: Secondary | ICD-10-CM | POA: Insufficient documentation

## 2015-03-14 DIAGNOSIS — Z87828 Personal history of other (healed) physical injury and trauma: Secondary | ICD-10-CM | POA: Insufficient documentation

## 2015-03-14 DIAGNOSIS — R079 Chest pain, unspecified: Secondary | ICD-10-CM | POA: Insufficient documentation

## 2015-03-14 DIAGNOSIS — F1220 Cannabis dependence, uncomplicated: Secondary | ICD-10-CM | POA: Diagnosis present

## 2015-03-14 DIAGNOSIS — F1210 Cannabis abuse, uncomplicated: Secondary | ICD-10-CM | POA: Insufficient documentation

## 2015-03-14 DIAGNOSIS — R45851 Suicidal ideations: Secondary | ICD-10-CM | POA: Diagnosis not present

## 2015-03-14 DIAGNOSIS — E079 Disorder of thyroid, unspecified: Secondary | ICD-10-CM | POA: Insufficient documentation

## 2015-03-14 DIAGNOSIS — Z79899 Other long term (current) drug therapy: Secondary | ICD-10-CM | POA: Insufficient documentation

## 2015-03-14 DIAGNOSIS — Z85850 Personal history of malignant neoplasm of thyroid: Secondary | ICD-10-CM | POA: Insufficient documentation

## 2015-03-14 DIAGNOSIS — F3164 Bipolar disorder, current episode mixed, severe, with psychotic features: Secondary | ICD-10-CM | POA: Diagnosis not present

## 2015-03-14 DIAGNOSIS — K219 Gastro-esophageal reflux disease without esophagitis: Secondary | ICD-10-CM | POA: Insufficient documentation

## 2015-03-14 DIAGNOSIS — F1420 Cocaine dependence, uncomplicated: Secondary | ICD-10-CM | POA: Diagnosis present

## 2015-03-14 DIAGNOSIS — F1424 Cocaine dependence with cocaine-induced mood disorder: Secondary | ICD-10-CM | POA: Insufficient documentation

## 2015-03-14 DIAGNOSIS — F17210 Nicotine dependence, cigarettes, uncomplicated: Secondary | ICD-10-CM | POA: Insufficient documentation

## 2015-03-14 LAB — CBC
HCT: 40.8 % (ref 39.0–52.0)
Hemoglobin: 13.8 g/dL (ref 13.0–17.0)
MCH: 29.4 pg (ref 26.0–34.0)
MCHC: 33.8 g/dL (ref 30.0–36.0)
MCV: 86.8 fL (ref 78.0–100.0)
PLATELETS: 294 10*3/uL (ref 150–400)
RBC: 4.7 MIL/uL (ref 4.22–5.81)
RDW: 14 % (ref 11.5–15.5)
WBC: 8.6 10*3/uL (ref 4.0–10.5)

## 2015-03-14 LAB — RAPID URINE DRUG SCREEN, HOSP PERFORMED
Amphetamines: NOT DETECTED
Barbiturates: NOT DETECTED
Benzodiazepines: NOT DETECTED
Cocaine: POSITIVE — AB
Opiates: NOT DETECTED
Tetrahydrocannabinol: POSITIVE — AB

## 2015-03-14 LAB — COMPREHENSIVE METABOLIC PANEL
ALT: 24 U/L (ref 17–63)
AST: 28 U/L (ref 15–41)
Albumin: 4.6 g/dL (ref 3.5–5.0)
Alkaline Phosphatase: 55 U/L (ref 38–126)
Anion gap: 12 (ref 5–15)
BILIRUBIN TOTAL: 1.2 mg/dL (ref 0.3–1.2)
BUN: 18 mg/dL (ref 6–20)
CO2: 26 mmol/L (ref 22–32)
CREATININE: 1.38 mg/dL — AB (ref 0.61–1.24)
Calcium: 9 mg/dL (ref 8.9–10.3)
Chloride: 102 mmol/L (ref 101–111)
GFR, EST NON AFRICAN AMERICAN: 60 mL/min — AB (ref >60)
Glucose, Bld: 90 mg/dL (ref 65–99)
Potassium: 3.5 mmol/L (ref 3.5–5.1)
Sodium: 140 mmol/L (ref 135–145)
TOTAL PROTEIN: 7.6 g/dL (ref 6.5–8.1)

## 2015-03-14 LAB — ETHANOL

## 2015-03-14 LAB — ACETAMINOPHEN LEVEL: Acetaminophen (Tylenol), Serum: <10 ug/mL — ABNORMAL LOW (ref 10–30)

## 2015-03-14 LAB — SALICYLATE LEVEL: Salicylate Lvl: <4 mg/dL (ref 2.8–30.0)

## 2015-03-14 LAB — I-STAT TROPONIN, ED: TROPONIN I, POC: 0 ng/mL (ref 0.00–0.08)

## 2015-03-14 MED ORDER — HYDROXYZINE HCL 25 MG PO TABS: 25.0000 mg | ORAL_TABLET | Freq: Three times a day (TID) | ORAL | Status: DC | PRN

## 2015-03-14 MED ORDER — DIVALPROEX SODIUM ER 500 MG PO TB24
1000.0000 mg | ORAL_TABLET | Freq: Every day | ORAL | Status: DC
  Administered 2015-03-14: 1000 mg via ORAL
  Filled 2015-03-14 (×2): qty 2

## 2015-03-14 MED ORDER — ALBUTEROL SULFATE HFA 108 (90 BASE) MCG/ACT IN AERS
1.0000 | INHALATION_SPRAY | Freq: Four times a day (QID) | RESPIRATORY_TRACT | Status: DC | PRN
  Administered 2015-03-14: 2 via RESPIRATORY_TRACT
  Filled 2015-03-14: qty 6.7

## 2015-03-14 MED ORDER — FAMOTIDINE 20 MG PO TABS
20.0000 mg | ORAL_TABLET | Freq: Every day | ORAL | Status: DC
  Administered 2015-03-14: 20 mg via ORAL
  Filled 2015-03-14: qty 1

## 2015-03-14 MED ORDER — LEVOTHYROXINE SODIUM 200 MCG PO TABS
200.0000 ug | ORAL_TABLET | Freq: Every day | ORAL | Status: DC
  Administered 2015-03-15: 200 ug via ORAL
  Filled 2015-03-14 (×2): qty 1

## 2015-03-14 MED ORDER — BENZTROPINE MESYLATE 1 MG PO TABS
0.5000 mg | ORAL_TABLET | Freq: Every day | ORAL | Status: DC
  Administered 2015-03-14: 0.5 mg via ORAL
  Filled 2015-03-14: qty 1

## 2015-03-14 MED ORDER — LORATADINE 10 MG PO TABS
10.0000 mg | ORAL_TABLET | Freq: Every day | ORAL | Status: DC
  Administered 2015-03-14 – 2015-03-15 (×2): 10 mg via ORAL
  Filled 2015-03-14 (×2): qty 1

## 2015-03-14 MED ORDER — ALBUTEROL SULFATE (2.5 MG/3ML) 0.083% IN NEBU
2.5000 mg | INHALATION_SOLUTION | RESPIRATORY_TRACT | Status: DC | PRN
  Filled 2015-03-14: qty 3

## 2015-03-14 MED ORDER — HALOPERIDOL 2 MG PO TABS
2.0000 mg | ORAL_TABLET | Freq: Every day | ORAL | Status: DC
  Administered 2015-03-14: 2 mg via ORAL
  Filled 2015-03-14: qty 1

## 2015-03-14 NOTE — ED Notes (Signed)
Pt states his Telepsych consult cut off early. Left machine in room in case she calls back
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
## 2015-03-14 NOTE — Progress Notes (Signed)
Patient requested albuterol inhaler for shortness of breath. Proventil 2 puffs given at 2044.

## 2015-03-14 NOTE — ED Notes (Signed)
Patient c/o SI without specific plan, states he may commit suicide at home, endorses AVH with mumbling unintelligible voice. Denies HI. Reports central pressure CP and HA onset today, reports cocaine and alcohol use today. History of depression, sees mental health specialist, but "not as much as I should," no disruption in support system, no recent hx of suicide attempts.

## 2015-03-14 NOTE — BH Assessment (Signed)
[place] Assessment Progress Note  The following facilities have been contacted to seek placement for this pt, with results as noted:  Beds available, information sent, decision pending:  [facility]  No beds available, but accepting referrals for future consideration; information sent:  [facility]   At capacity:  [facility], [place] Triage Specialist [phone]

## 2015-03-14 NOTE — ED Notes (Signed)
Dr a and Reginold Agent NP into see
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
## 2015-03-15 DIAGNOSIS — F1494 Cocaine use, unspecified with cocaine-induced mood disorder: Secondary | ICD-10-CM | POA: Diagnosis present

## 2015-03-15 DIAGNOSIS — F1420 Cocaine dependence, uncomplicated: Secondary | ICD-10-CM

## 2015-03-15 NOTE — Discharge Instructions (Signed)
For your ongoing mental health needs, you are advised to follow up with [facility].  New and returning patients are seen at their walk-in clinic.  Walk-in hours are Monday - Friday from 8:00 am - 3:00 pm.  Walk-in patients are seen on a first come, first served basis.  Try to arrive as early as possible for he best chance of being seen the same day: ° °     [facility] °     [address] °     [place], [place] [address] °     [phone] °

## 2015-03-15 NOTE — BH Assessment (Signed)
[facility] Assessment Progress Note  Per Corena Pilgrim, MD, this pt does not require psychiatric hospitalization.  Pt is to be discharged from [facility] with outpatient referrals.  Information for [facility] has been included in pt's discharge instructions.  Pt's nurse has been notified.  Jalene Mullet, [place] Triage Specialist [phone]

## 2015-03-15 NOTE — BHH Suicide Risk Assessment (Signed)
Suicide Risk Assessment  Discharge Assessment   [facility] Discharge Suicide Risk Assessment   Demographic Factors:  Male  Total Time spent with patient: 45 minutes  Musculoskeletal: Strength & Muscle Tone: within normal limits Gait & Station: normal Patient leans: N/A  Psychiatric Specialty Exam: Review of Systems  Constitutional: Negative.   HENT: Negative.   Eyes: Negative.   Respiratory: Negative.   Cardiovascular: Negative.   Gastrointestinal: Negative.   Genitourinary: Negative.   Musculoskeletal: Negative.   Skin: Negative.   Neurological: Negative.   Endo/Heme/Allergies: Negative.   Psychiatric/Behavioral: Positive for substance abuse.    Blood pressure 100/77, pulse 77, temperature 97.9 F (36.6 C), temperature source Oral, resp. rate 18, SpO2 98 %.There is no weight on file to calculate BMI.  General Appearance: Casual  Eye Contact::  Good  Speech:  Normal Rate  Volume:  Normal  Mood:  Irritable  Affect:  Congruent  Thought Process:  Coherent  Orientation:  Full (Time, Place, and Person)  Thought Content:  WDL  Suicidal Thoughts:  No  Homicidal Thoughts:  No  Memory:  Immediate;   Good Recent;   Good Remote;   Good  Judgement:  Fair  Insight:  Fair  Psychomotor Activity:  Normal  Concentration:  Good  Recall:  Good  Fund of Knowledge:Good  Language: Good  Akathisia:  No  Handed:  Right  AIMS (if indicated):     Assets:  Housing Leisure Time Physical Health Resilience Social Support  ADL's:  Intact  Cognition: WNL  Sleep:      Has this patient used any form of tobacco in the last 30 days? (Cigarettes, Smokeless Tobacco, Cigars, and/or Pipes) Yes, A prescription for an FDA-approved tobacco cessation medication was offered at discharge and the patient refused  Mental Status Per Nursing Assessment::   On Admission:   cocaine abuse, suicidal ideations  Current Mental Status by Physician: NA  Loss Factors: NA  Historical Factors: NA  Risk  Reduction Factors:   Responsible for children under 3 years of age, Sense of responsibility to family, Employed, Living with another person, especially a relative, Positive social support and Positive therapeutic relationship  Continued Clinical Symptoms:  Irritability  Cognitive Features That Contribute To Risk:  None    Suicide Risk:  Minimal: No identifiable suicidal ideation.  Patients presenting with no risk factors but with morbid ruminations; may be classified as minimal risk based on the severity of the depressive symptoms  Principal Problem: Cocaine-induced mood disorder [facility]) Discharge Diagnoses:  Patient Active Problem List   Diagnosis Date Noted  . 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 Cannabis use disorder, severe, dependence ([place]) [F12.20] 03/04/2015    Priority: High  . Cocaine use disorder, severe, dependence ([place]) [F14.20] 03/03/2015    Priority: High  . Bipolar disorder, curr episode mixed, severe, with psychotic features ([place]) [F31.64] 03/04/2015  . Hypokalemia [E87.6] 03/04/2015  . Pneumothorax on right [J93.9] 01/02/2013  . Acid reflux [K21.9] 07/08/2012  . Malignant neoplasm of thyroid gland ([place]) [C73] 07/08/2012  . Other abnormal glucose [R73.09] 09/02/2011  . Plantar fasciitis, bilateral [M72.2] 07/22/2011  . Neuropathic pain of thigh, right [G57.91] 07/22/2011  . 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 Thyroid cancer ([place]) [C73] 04/27/2011      Plan Of Care/Follow-up recommendations:  Activity:  as tolerated Diet:  heart healthy diet  Is patient on multiple antipsychotic therapies at discharge:  No   Has Patient had three or more failed trials  of antipsychotic monotherapy by history:  No  Recommended Plan for Multiple Antipsychotic Therapies: NA    LORD, JAMISON, PMH-NP 03/15/2015, 10:41 AM
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
## 2015-03-15 NOTE — ED Notes (Signed)
Pt asked to sign for discharge and became angry and stormed off and slammed door shut.  Security notified that they may have to escort pt. Out per Dr. Arvilla Meres.  Pt had denied SI and HI this am  He is in no distress at time of discharge.

## 2015-09-23 ENCOUNTER — Emergency Department (HOSPITAL_COMMUNITY)
Admission: EM | Admit: 2015-09-23 | Discharge: 2015-09-23 | Disposition: A | Discharge status: Home / Self Care | Payer: Self-pay | Attending: Dermatology | Admitting: Dermatology

## 2015-09-23 ENCOUNTER — Encounter (HOSPITAL_COMMUNITY): Payer: Self-pay | Admitting: Emergency Medicine

## 2015-09-23 DIAGNOSIS — F17210 Nicotine dependence, cigarettes, uncomplicated: Secondary | ICD-10-CM | POA: Insufficient documentation

## 2015-09-23 DIAGNOSIS — D093 Carcinoma in situ of thyroid and other endocrine glands: Secondary | ICD-10-CM | POA: Insufficient documentation

## 2015-09-23 DIAGNOSIS — J45909 Unspecified asthma, uncomplicated: Secondary | ICD-10-CM | POA: Insufficient documentation

## 2015-09-23 DIAGNOSIS — Z5321 Procedure and treatment not carried out due to patient leaving prior to being seen by health care provider: Secondary | ICD-10-CM | POA: Insufficient documentation

## 2015-09-23 MED ORDER — ALBUTEROL SULFATE (2.5 MG/3ML) 0.083% IN NEBU
5.0000 mg | INHALATION_SOLUTION | Freq: Once | RESPIRATORY_TRACT | Status: AC
Start: 2015-09-23 — End: 2015-09-23
  Administered 2015-09-23: 5 mg via RESPIRATORY_TRACT
  Filled 2015-09-23: qty 6

## 2015-09-23 NOTE — ED Notes (Signed)
Pt c/o asthma attack starting 5-10 minutes ago. Pt refusing to answer questions until he gets frusterated with questions being asked and then responds abruptly. When trying to assess lungs pt sts "I can't"  And holds his breath so breath sounds are unable to be assessed. Pt sts he does not have an inhaler. Pt speaking in complete sentences when talking. Pt denies chest pain. Pt not answering any other assessment questions at this time.

## 2015-09-23 NOTE — ED Notes (Signed)
Pt telling a different RN that he was woken up out of sleep by acid reflux before all of this began.

## 2015-09-23 NOTE — ED Notes (Signed)
After pt received breathing treatment, pt appears calm, breathing easy, now speaking with this RN. Pt's 95% on room air, HR 84.

## 2015-09-23 NOTE — ED Notes (Signed)
No answer from triage

## 2015-10-17 ENCOUNTER — Emergency Department (HOSPITAL_BASED_OUTPATIENT_CLINIC_OR_DEPARTMENT_OTHER)
Admission: EM | Admit: 2015-10-17 | Discharge: 2015-10-17 | Disposition: A | Discharge status: Home / Self Care | Payer: Self-pay | Attending: Emergency Medicine | Admitting: Emergency Medicine

## 2015-10-17 ENCOUNTER — Encounter (HOSPITAL_BASED_OUTPATIENT_CLINIC_OR_DEPARTMENT_OTHER): Payer: Self-pay

## 2015-10-17 DIAGNOSIS — J4520 Mild intermittent asthma, uncomplicated: Secondary | ICD-10-CM

## 2015-10-17 DIAGNOSIS — F17210 Nicotine dependence, cigarettes, uncomplicated: Secondary | ICD-10-CM | POA: Insufficient documentation

## 2015-10-17 DIAGNOSIS — Z7951 Long term (current) use of inhaled steroids: Secondary | ICD-10-CM | POA: Insufficient documentation

## 2015-10-17 DIAGNOSIS — Z79899 Other long term (current) drug therapy: Secondary | ICD-10-CM | POA: Insufficient documentation

## 2015-10-17 DIAGNOSIS — Z85850 Personal history of malignant neoplasm of thyroid: Secondary | ICD-10-CM | POA: Insufficient documentation

## 2015-10-17 HISTORY — DX: Cocaine abuse, uncomplicated: F14.10

## 2015-10-17 HISTORY — DX: Unspecified asthma, uncomplicated: J45.909

## 2015-10-17 MED ORDER — AEROCHAMBER PLUS FLO-VU MEDIUM MISC
1.0000 | Freq: Once | Status: AC
  Administered 2015-10-17: 1
  Filled 2015-10-17: qty 1

## 2015-10-17 MED ORDER — ALBUTEROL SULFATE HFA 108 (90 BASE) MCG/ACT IN AERS
2.0000 | INHALATION_SPRAY | Freq: Once | RESPIRATORY_TRACT | Status: AC
  Administered 2015-10-17: 2 via RESPIRATORY_TRACT
  Filled 2015-10-17: qty 6.7

## 2015-10-17 NOTE — ED Triage Notes (Signed)
C/o "asthma attack"-SOB since 3pm-NAD-states he is at [facility] and has been using own albuterol at facility-at [facility] x 2 weeks for cocaine abuse
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
## 2016-02-21 ENCOUNTER — Encounter (HOSPITAL_COMMUNITY): Payer: Self-pay

## 2016-02-21 ENCOUNTER — Emergency Department (HOSPITAL_COMMUNITY)
Admission: EM | Admit: 2016-02-21 | Discharge: 2016-02-21 | Disposition: A | Discharge status: Home / Self Care | Payer: Self-pay | Attending: Emergency Medicine | Admitting: Emergency Medicine

## 2016-02-21 DIAGNOSIS — Z85850 Personal history of malignant neoplasm of thyroid: Secondary | ICD-10-CM | POA: Insufficient documentation

## 2016-02-21 DIAGNOSIS — R319 Hematuria, unspecified: Secondary | ICD-10-CM | POA: Insufficient documentation

## 2016-02-21 DIAGNOSIS — F17210 Nicotine dependence, cigarettes, uncomplicated: Secondary | ICD-10-CM | POA: Insufficient documentation

## 2016-02-21 DIAGNOSIS — L293 Anogenital pruritus, unspecified: Secondary | ICD-10-CM | POA: Insufficient documentation

## 2016-02-21 DIAGNOSIS — J45909 Unspecified asthma, uncomplicated: Secondary | ICD-10-CM | POA: Insufficient documentation

## 2016-02-21 DIAGNOSIS — Z711 Person with feared health complaint in whom no diagnosis is made: Secondary | ICD-10-CM

## 2016-02-21 LAB — URINALYSIS, ROUTINE W REFLEX MICROSCOPIC
BILIRUBIN URINE: NEGATIVE
Glucose, UA: NEGATIVE mg/dL
Hgb urine dipstick: NEGATIVE
Ketones, ur: NEGATIVE mg/dL
Leukocytes, UA: NEGATIVE
NITRITE: NEGATIVE
PH: 7 (ref 5.0–8.0)
Protein, ur: NEGATIVE mg/dL
SPECIFIC GRAVITY, URINE: 1.023 (ref 1.005–1.030)

## 2016-02-21 MED ORDER — CEFTRIAXONE SODIUM 250 MG IJ SOLR
250.0000 mg | Freq: Once | INTRAMUSCULAR | Status: AC
  Administered 2016-02-21: 250 mg via INTRAMUSCULAR
  Filled 2016-02-21: qty 250

## 2016-02-21 MED ORDER — AZITHROMYCIN 250 MG PO TABS
1000.0000 mg | ORAL_TABLET | Freq: Once | ORAL | Status: AC
  Administered 2016-02-21: 1000 mg via ORAL
  Filled 2016-02-21: qty 4

## 2016-02-21 MED ORDER — METRONIDAZOLE 500 MG PO TABS
2000.0000 mg | ORAL_TABLET | Freq: Once | ORAL | Status: AC
  Administered 2016-02-21: 2000 mg via ORAL
  Filled 2016-02-21: qty 4

## 2016-02-21 NOTE — ED Triage Notes (Signed)
Pt c/o penile "itching" x 1 day.  Sts he recently had a new sexual partner.  Denies discharge and dysuria.

## 2016-02-21 NOTE — ED Notes (Signed)
Patient states that he had HIV and STD testing at [facility] within the last 2 months and refused blood draw

## 2016-02-21 NOTE — ED Notes (Signed)
Patient is alert and oriented x3.  He was given DC instructions and follow up visit instructions.  Patient gave verbal understanding.  He was DC ambulatory under his own power to home.  V/S stable.  He was not showing any signs of distress on DC 

## 2016-02-21 NOTE — Discharge Instructions (Signed)
May consider using over the counter lotrimin or jock itch cream for the itching. You have been treated for gonorrhea and chlamydia in the ER but the hospital will call you if lab is positive. NO SEXUAL INTERCOURSE FOR AT LEAST 10 DAYS AFTER TODAY'S VISIT, THIS WILL INVALIDATE YOUR TREATMENT HERE. DO NOT ENGAGE IN SEXUAL ACTIVITY UNTIL YOU FIND OUT ABOUT YOUR RESULTS AND HAVE PARTNERS TESTED AND TREATED. ALL PARTNERS MUST BE TESTED AND TREATED FOR STD'S. ALWAYS USE CONDOMS WHEN ENGAGING IN INTERCOURSE. Follow up with [facility] Department STD clinic for future STD concerns or screenings. This is the recommendation by the CDC for people with multiple sexual partners or history of STDs. Follow up with your regular doctor in 1 week for recheck of symptoms. Return to the ER for changes or worsening symptoms.
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
## 2016-02-24 LAB — GC/CHLAMYDIA PROBE AMP (~~LOC~~) NOT AT ARMC
CHLAMYDIA, DNA PROBE: NEGATIVE
NEISSERIA GONORRHEA: NEGATIVE

## 2016-03-31 ENCOUNTER — Emergency Department (HOSPITAL_BASED_OUTPATIENT_CLINIC_OR_DEPARTMENT_OTHER): Payer: Self-pay

## 2016-03-31 ENCOUNTER — Encounter (HOSPITAL_BASED_OUTPATIENT_CLINIC_OR_DEPARTMENT_OTHER): Payer: Self-pay | Admitting: *Deleted

## 2016-03-31 ENCOUNTER — Emergency Department (HOSPITAL_BASED_OUTPATIENT_CLINIC_OR_DEPARTMENT_OTHER)
Admission: EM | Admit: 2016-03-31 | Discharge: 2016-03-31 | Disposition: A | Discharge status: Home / Self Care | Payer: Self-pay | Attending: Emergency Medicine | Admitting: Emergency Medicine

## 2016-03-31 DIAGNOSIS — Z79899 Other long term (current) drug therapy: Secondary | ICD-10-CM | POA: Insufficient documentation

## 2016-03-31 DIAGNOSIS — Z85850 Personal history of malignant neoplasm of thyroid: Secondary | ICD-10-CM | POA: Insufficient documentation

## 2016-03-31 DIAGNOSIS — M25561 Pain in right knee: Secondary | ICD-10-CM | POA: Insufficient documentation

## 2016-03-31 DIAGNOSIS — J45909 Unspecified asthma, uncomplicated: Secondary | ICD-10-CM | POA: Insufficient documentation

## 2016-03-31 DIAGNOSIS — F17210 Nicotine dependence, cigarettes, uncomplicated: Secondary | ICD-10-CM | POA: Insufficient documentation

## 2016-03-31 DIAGNOSIS — E890 Postprocedural hypothyroidism: Secondary | ICD-10-CM | POA: Insufficient documentation

## 2016-03-31 MED ORDER — IBUPROFEN 600 MG PO TABS: 600.0000 mg | ORAL_TABLET | Freq: Four times a day (QID) | ORAL | 0 refills | Status: DC | PRN

## 2016-03-31 NOTE — ED Triage Notes (Signed)
C/o  Right knee pain x 1 week. No injury. C/o swelling in that knee.
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
## 2016-03-31 NOTE — Discharge Instructions (Signed)
Medications: Ibuprofen  Treatment: Take ibuprofen every 6 hours for your pain. Use ice 3-4 times daily alternating 20 minutes on, 20 minutes off. Wear your knee brace at all times for support. Elevate your leg when you're not ambulating on it.  Follow-up: Please follow-up with Dr. Barbaraann Barthel, a sports medicine doctor, for further evaluation and treatment of your knee pain. Please return to the emergency department if you develop any new or worsening symptoms.

## 2016-03-31 NOTE — ED Notes (Signed)
ED Provider at bedside. 

## 2016-04-06 ENCOUNTER — Emergency Department (HOSPITAL_COMMUNITY)
Admission: EM | Admit: 2016-04-06 | Discharge: 2016-04-06 | Disposition: A | Discharge status: Home / Self Care | Payer: Self-pay | Attending: Emergency Medicine | Admitting: Emergency Medicine

## 2016-04-06 ENCOUNTER — Encounter (HOSPITAL_COMMUNITY): Payer: Self-pay | Admitting: *Deleted

## 2016-04-06 DIAGNOSIS — M25561 Pain in right knee: Secondary | ICD-10-CM | POA: Insufficient documentation

## 2016-04-06 DIAGNOSIS — Z5321 Procedure and treatment not carried out due to patient leaving prior to being seen by health care provider: Secondary | ICD-10-CM | POA: Insufficient documentation

## 2016-04-06 LAB — URINALYSIS, ROUTINE W REFLEX MICROSCOPIC
Bacteria, UA: NONE SEEN
Bilirubin Urine: NEGATIVE
GLUCOSE, UA: NEGATIVE mg/dL
HGB URINE DIPSTICK: NEGATIVE
Ketones, ur: NEGATIVE mg/dL
Leukocytes, UA: NEGATIVE
NITRITE: NEGATIVE
PH: 7 (ref 5.0–8.0)
Protein, ur: 30 mg/dL — AB
SPECIFIC GRAVITY, URINE: 1.024 (ref 1.005–1.030)
Squamous Epithelial / LPF: NONE SEEN

## 2016-04-06 LAB — CBC
HCT: 41.9 % (ref 39.0–52.0)
HEMOGLOBIN: 14.5 g/dL (ref 13.0–17.0)
MCH: 29.7 pg (ref 26.0–34.0)
MCHC: 34.6 g/dL (ref 30.0–36.0)
MCV: 85.9 fL (ref 78.0–100.0)
PLATELETS: 286 10*3/uL (ref 150–400)
RBC: 4.88 MIL/uL (ref 4.22–5.81)
RDW: 14.2 % (ref 11.5–15.5)
WBC: 11.2 10*3/uL — ABNORMAL HIGH (ref 4.0–10.5)

## 2016-04-06 LAB — COMPREHENSIVE METABOLIC PANEL
ALBUMIN: 4.8 g/dL (ref 3.5–5.0)
ALT: 18 U/L (ref 17–63)
ANION GAP: 8 (ref 5–15)
AST: 19 U/L (ref 15–41)
Alkaline Phosphatase: 53 U/L (ref 38–126)
BUN: 15 mg/dL (ref 6–20)
CALCIUM: 9 mg/dL (ref 8.9–10.3)
CO2: 28 mmol/L (ref 22–32)
Chloride: 104 mmol/L (ref 101–111)
Creatinine, Ser: 1.2 mg/dL (ref 0.61–1.24)
GFR calc Af Amer: >60 mL/min (ref >60)
GFR calc non Af Amer: >60 mL/min (ref >60)
GLUCOSE: 109 mg/dL — AB (ref 65–99)
Potassium: 4 mmol/L (ref 3.5–5.1)
Sodium: 140 mmol/L (ref 135–145)
TOTAL PROTEIN: 7.8 g/dL (ref 6.5–8.1)
Total Bilirubin: 0.9 mg/dL (ref 0.3–1.2)

## 2016-04-06 LAB — LIPASE, BLOOD: Lipase: 29 U/L (ref 11–51)

## 2016-04-06 NOTE — ED Notes (Signed)
Pt left. 

## 2016-04-06 NOTE — ED Triage Notes (Addendum)
Pt here for worsening right knee pain which is not related to any recent injury, pain has been increasing since moving to a new place where he has to climp stairs a lot. Pt was seen for this on 1/16 and is ambulatory today with a limp. Pt also states that he woke up this am with diarrhea and vomiting and abdominal pain. Pt reports 4 episodes of vomiting and 3 episodes of diarrhea since 8:30 today. Pain is in lower abdomen.  Pt also reports chills and hot flashes.  No GU symptom

## 2016-04-06 NOTE — ED Notes (Signed)
Called pt for room placement no response. 

## 2016-04-09 ENCOUNTER — Encounter: Payer: Self-pay | Admitting: Family Medicine

## 2016-04-09 ENCOUNTER — Ambulatory Visit (INDEPENDENT_AMBULATORY_CARE_PROVIDER_SITE_OTHER): Payer: Self-pay | Admitting: Family Medicine

## 2016-04-09 ENCOUNTER — Other Ambulatory Visit: Payer: Self-pay | Admitting: Family Medicine

## 2016-04-09 VITALS — BP 137/92 | HR 82 | Ht 74.0 in | Wt 240.0 lb

## 2016-04-09 DIAGNOSIS — M25561 Pain in right knee: Secondary | ICD-10-CM

## 2016-04-09 DIAGNOSIS — M25461 Effusion, right knee: Secondary | ICD-10-CM

## 2016-04-09 MED ORDER — HYDROCODONE-ACETAMINOPHEN 5-325 MG PO TABS: 1.0000 | ORAL_TABLET | Freq: Four times a day (QID) | ORAL | 0 refills | Status: DC | PRN

## 2016-04-09 MED FILL — HYDROCODON-APAP 5-325: 5-325 | 5 days supply | Qty: 20 | Fill #0

## 2016-04-09 NOTE — Patient Instructions (Addendum)
This is consistent with an acute gout flare. Your knee was aspirated and injected today. Compression sleeve as you have been. Elevation as much as possible. Icing 15 minutes at a time 3-4 times a day. Take aleve 2 tabs twice a day with food OR ibuprofen 600mg  three times a day with food for pain and inflammation. Norco as needed for severe pain (no driving on this). Call me in a week if you're not significantly better.

## 2016-04-10 DIAGNOSIS — M25461 Effusion, right knee: Secondary | ICD-10-CM

## 2016-04-10 LAB — SYNOVIAL CELL COUNT + DIFF, W/ CRYSTALS
Basophils, %: 0 %
Eosinophils-Synovial: 0 % (ref 0–2)
Lymphocytes-Synovial Fld: 52 % (ref 0–74)
Monocyte/Macrophage: 24 % (ref 0–69)
Neutrophil, Synovial: 24 % (ref 0–24)
Synoviocytes, %: 0 % (ref 0–15)
WBC, Synovial: 205 cells/uL — ABNORMAL HIGH (ref <150)

## 2016-04-10 MED ORDER — METHYLPREDNISOLONE ACETATE 40 MG/ML IJ SUSP
40.0000 mg | Freq: Once | INTRAMUSCULAR | Status: AC
  Administered 2016-04-10: 40 mg via INTRA_ARTICULAR

## 2016-04-12 DIAGNOSIS — M25561 Pain in right knee: Secondary | ICD-10-CM | POA: Insufficient documentation

## 2016-04-12 NOTE — Assessment & Plan Note (Signed)
with confirmed effusion on ultrasound.  Independently reviewed radiographs and no abnormalities noted.  No injury and exam is reassuring.  Concerning for acute gout flare.  Discussed options - went ahead with aspiration/injection today.  Fluid sent for analysis.  Icing, elevation, compression, aleve or ibuprofen.  Norco as needed for severe pain.  Call us in 1 week for an update on his status.  After informed written consent patient was lying supine on exam table.  Right knee was prepped with alcohol swab.  Utilizing superolateral approach, 3 mL of bupivicaine was used for local anesthesia.  Then using an 18g needle on 60cc syringe, 43 mL of serosanguinous fluid was aspirated from right knee.  Knee was then injected with 3:1 bupivicaine:depomedrol.  Patient tolerated procedure well without immediate complications
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
## 2016-04-13 LAB — BODY FLUID CULTURE
GRAM STAIN: NONE SEEN
ORGANISM ID, BACTERIA: NO GROWTH

## 2016-04-30 ENCOUNTER — Telehealth: Payer: Self-pay | Admitting: *Deleted

## 2016-04-30 NOTE — Telephone Encounter (Signed)
Patient called and said his knee is still hurting, can't bend knee. Scheduled him to come in for visit.

## 2016-04-30 NOTE — Telephone Encounter (Signed)
Noted, thanks!

## 2016-05-01 ENCOUNTER — Ambulatory Visit (INDEPENDENT_AMBULATORY_CARE_PROVIDER_SITE_OTHER): Payer: Self-pay | Admitting: Family Medicine

## 2016-05-01 ENCOUNTER — Encounter: Payer: Self-pay | Admitting: Family Medicine

## 2016-05-01 DIAGNOSIS — M25561 Pain in right knee: Secondary | ICD-10-CM

## 2016-05-01 MED ORDER — PREDNISONE 10 MG PO TABS: ORAL_TABLET | ORAL | 0 refills | Status: DC

## 2016-05-01 MED ORDER — HYDROCODONE-ACETAMINOPHEN 5-325 MG PO TABS: 1.0000 | ORAL_TABLET | Freq: Four times a day (QID) | ORAL | 0 refills | Status: DC | PRN

## 2016-05-01 NOTE — Patient Instructions (Signed)
Try prednisone dose pack x 6 days - finish this even if you're feeling better. Day AFTER finishing prednisone take aleve 2 tabs twice a day with food OR ibuprofen 600mg  three times a day with food for pain and inflammation. Norco as needed for severe pain. Get the cone coverage and call us when this goes through. If still not improving would go ahead with MRI of this knee.
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
## 2016-05-05 NOTE — Assessment & Plan Note (Signed)
previously had effusion and improved completely with aspiration and injection.  Synovial fluid analysis without evidence gout, pseudogout.  Cells were clumped though count was elevated.  Radiographs were negative.  Concerning for either inflammatory arthropathy or meniscus tear (but no injury prior to this).  Will try prednisone dose pack then transition to aleve or ibuprofen.  Norco as needed for severe pain.  Encouraged to get cone coverage and paperwork provided - consider MRI if not improving.

## 2016-06-04 ENCOUNTER — Ambulatory Visit: Payer: Self-pay | Admitting: Family Medicine

## 2016-06-17 ENCOUNTER — Encounter: Payer: Self-pay | Admitting: Family Medicine

## 2016-06-17 ENCOUNTER — Ambulatory Visit (INDEPENDENT_AMBULATORY_CARE_PROVIDER_SITE_OTHER): Payer: Self-pay | Admitting: Family Medicine

## 2016-06-17 VITALS — BP 140/76 | HR 74 | Temp 97.8°F | Ht 74.0 in | Wt 232.0 lb

## 2016-06-17 DIAGNOSIS — R7989 Other specified abnormal findings of blood chemistry: Secondary | ICD-10-CM

## 2016-06-17 DIAGNOSIS — Z13220 Encounter for screening for lipoid disorders: Secondary | ICD-10-CM

## 2016-06-17 DIAGNOSIS — Z131 Encounter for screening for diabetes mellitus: Secondary | ICD-10-CM

## 2016-06-17 DIAGNOSIS — M25561 Pain in right knee: Secondary | ICD-10-CM

## 2016-06-17 LAB — CBC WITH DIFFERENTIAL/PLATELET
BASOS ABS: 79 {cells}/uL (ref 0–200)
Basophils Relative: 1 %
EOS ABS: 158 {cells}/uL (ref 15–500)
EOS PCT: 2 %
HCT: 41.8 % (ref 38.5–50.0)
Hemoglobin: 13.9 g/dL (ref 13.2–17.1)
Lymphocytes Relative: 35 %
Lymphs Abs: 2765 cells/uL (ref 850–3900)
MCH: 29.4 pg (ref 27.0–33.0)
MCHC: 33.3 g/dL (ref 32.0–36.0)
MCV: 88.4 fL (ref 80.0–100.0)
MPV: 9.6 fL (ref 7.5–12.5)
Monocytes Absolute: 553 cells/uL (ref 200–950)
Monocytes Relative: 7 %
NEUTROS PCT: 55 %
Neutro Abs: 4345 cells/uL (ref 1500–7800)
Platelets: 331 10*3/uL (ref 140–400)
RBC: 4.73 MIL/uL (ref 4.20–5.80)
RDW: 14 % (ref 11.0–15.0)
WBC: 7.9 10*3/uL (ref 3.8–10.8)

## 2016-06-17 LAB — COMPREHENSIVE METABOLIC PANEL
ALK PHOS: 55 U/L (ref 40–115)
ALT: 19 U/L (ref 9–46)
AST: 24 U/L (ref 10–40)
Albumin: 4.1 g/dL (ref 3.6–5.1)
BUN: 13 mg/dL (ref 7–25)
CO2: 24 mmol/L (ref 20–31)
Calcium: 9.2 mg/dL (ref 8.6–10.3)
Chloride: 106 mmol/L (ref 98–110)
Creat: 1.3 mg/dL (ref 0.60–1.35)
GLUCOSE: 86 mg/dL (ref 65–99)
Potassium: 4.1 mmol/L (ref 3.5–5.3)
Sodium: 143 mmol/L (ref 135–146)
Total Bilirubin: 0.5 mg/dL (ref 0.2–1.2)
Total Protein: 6.5 g/dL (ref 6.1–8.1)

## 2016-06-17 LAB — LIPID PANEL
CHOL/HDL RATIO: 4.3 ratio (ref <5.0)
Cholesterol: 152 mg/dL (ref <200)
HDL: 35 mg/dL — ABNORMAL LOW (ref >40)
LDL CALC: 81 mg/dL (ref <100)
Triglycerides: 179 mg/dL — ABNORMAL HIGH (ref <150)
VLDL: 36 mg/dL — AB (ref <30)

## 2016-06-17 MED ORDER — ALBUTEROL SULFATE HFA 108 (90 BASE) MCG/ACT IN AERS: 1.0000 | INHALATION_SPRAY | Freq: Four times a day (QID) | RESPIRATORY_TRACT | 0 refills | Status: DC | PRN

## 2016-06-17 MED ORDER — COLCHICINE 0.6 MG PO TABS: 0.6000 mg | ORAL_TABLET | Freq: Every day | ORAL | 1 refills | Status: DC

## 2016-06-17 MED ORDER — BUDESONIDE-FORMOTEROL FUMARATE 80-4.5 MCG/ACT IN AERO: 2.0000 | INHALATION_SPRAY | Freq: Two times a day (BID) | RESPIRATORY_TRACT | 3 refills | Status: DC

## 2016-06-17 MED ORDER — GABAPENTIN 100 MG PO CAPS: 200.0000 mg | ORAL_CAPSULE | Freq: Four times a day (QID) | ORAL | 0 refills | Status: DC

## 2016-06-17 MED FILL — GABAPENTIN 100 MG CAP: 100 | 30 days supply | Qty: 240 | Fill #0

## 2016-06-17 MED FILL — SYMBICORT 80-4.5 MCG INH: 80-4.5 | 3 days supply | Qty: 10 | Fill #0

## 2016-06-17 MED FILL — COLCHICINE 0.6 MG TABLET: 0.6 | 30 days supply | Qty: 30 | Fill #0

## 2016-06-17 NOTE — Patient Instructions (Signed)
Knee pain Start Gabapentin 200 mg up to 4 times per day as needed for severe knee pain.  Start Colchicine (Gout ) medication today.  Take 2 tablets now and take 1 tablet one hour later. Tomorrow start taking Colchicine 1 pill daily. Return in two week for evaluation of knee pain.     Gout Gout is painful swelling that can happen in some of your joints. Gout is a type of arthritis. This condition is caused by having too much uric acid in your body. Uric acid is a chemical that is made when your body breaks down substances called purines. If your body has too much uric acid, sharp crystals can form and build up in your joints. This causes pain and swelling. Gout attacks can happen quickly and be very painful (acute gout). Over time, the attacks can affect more joints and happen more often (chronic gout). Follow these instructions at home: During a Gout Attack   If directed, put ice on the painful area:  Put ice in a plastic bag.  Place a towel between your skin and the bag.  Leave the ice on for 20 minutes, 2-3 times a day.  Rest the joint as much as possible. If the joint is in your leg, you may be given crutches to use.  Raise (elevate) the painful joint above the level of your heart as often as you can.  Drink enough fluids to keep your pee (urine) clear or pale yellow.  Take over-the-counter and prescription medicines only as told by your doctor.  Do not drive or use heavy machinery while taking prescription pain medicine.  Follow instructions from your doctor about what you can or cannot eat and drink.  Return to your normal activities as told by your doctor. Ask your doctor what activities are safe for you. Avoiding Future Gout Attacks   Follow a low-purine diet as told by a specialist (dietitian) or your doctor. Avoid foods and drinks that have a lot of purines, such as:  Liver.  Kidney.  Anchovies.  Asparagus.  Herring.  Mushrooms  Mussels.  Beer.  Limit  alcohol intake to no more than 1 drink a day for nonpregnant women and 2 drinks a day for men. One drink equals 12 oz of beer, 5 oz of wine, or 1 oz of hard liquor.  Stay at a healthy weight or lose weight if you are overweight. If you want to lose weight, talk with your doctor. It is important that you do not lose weight too fast.  Start or continue an exercise plan as told by your doctor.  Drink enough fluids to keep your pee clear or pale yellow.  Take over-the-counter and prescription medicines only as told by your doctor.  Keep all follow-up visits as told by your doctor. This is important. Contact a doctor if:  You have another gout attack.  You still have symptoms of a gout attack after10 days of treatment.  You have problems (side effects) because of your medicines.  You have chills or a fever.  You have burning pain when you pee (urinate).  You have pain in your lower back or belly. Get help right away if:  You have very bad pain.  Your pain cannot be controlled.  You cannot pee. This information is not intended to replace advice given to you by your health care provider. Make sure you discuss any questions you have with your health care provider. Document Released: 12/10/2007 Document Revised: 08/08/2015 Document Reviewed:  12/13/2014 Elsevier Interactive Patient Education  2017 [employer].

## 2016-06-17 NOTE — Progress Notes (Signed)
Patient ID: Nicholas Mccarty, male    DOB: 1970-10-03, 46 y.o.   MRN: [id number]  PCP: Molli Barrows, FNP  Chief Complaint  Patient presents with  . Establish Care  . Knee Pain    RIGHT    Subjective:  HPI Nicholas Mccarty is a 46 y.o. male presents to establish care and evaluation of ongoing knee pain. Chronic medical problems include hx of thyroid cancer, with post surgical induced hypothyroidism, GERD, cocaine and marijuana use.  At present he doesn't have a primary care provider.  He was previously followed at [facility] although he was dismissed due to inability to pain for visits.   For his diagnosis of thyroid cancer, he is followed at [facility] and reports routine follow-ups scheduled about every 6 months. For chronic knee pain he was most recently followed by Sports Medicine in [facility]. He reports that his treatment has consisted of steroidal injections, oral prednisone, and anti-inflammatory treatment without relief of knee pain. He reports at present he is in excruciating pain.  In review of prior notes, he was found to have fluid within his knee which was removed via aspiration although I do not see that collected fluid was sent out for culture.  Correction-further review 04/09/2016 labs placed twice, fluid culture was unremarkable and revealed no infectious agent or crystals in knee aspiration fluid that would be associated to cause of knee pain. He admits to current use of cocaine and alcohol.  Social History   Social History  . Marital status: Married    Spouse name: N/A  . Number of children: N/A  . Years of education: N/A   Occupational History  . Not on file.   Social History Main Topics  . Smoking status: Current Some Day Smoker    Packs/day: 0.50    Types: Cigarettes  . Smokeless tobacco: Never Used  . Alcohol use Yes     Comment: occ  . Drug use: Yes    Types: "Crack" cocaine, Marijuana     Comment: Daily  . Sexual activity: Yes    Other Topics Concern  . Not on file   Social History Narrative  . No narrative on file    Family History  Problem Relation Age of Onset  . Cancer Neg Hx   . Heart disease Neg Hx   . Hyperlipidemia Neg Hx   . Hypertension Neg Hx   . Diabetes Neg Hx   . Stroke Neg Hx   . Mental illness Neg Hx    Review of Systems See HPI Patient Active Problem List   Diagnosis Date Noted  . Right knee pain 04/12/2016  . Cocaine-induced mood disorder ([place]) 03/15/2015  . Bipolar disorder, curr episode mixed, severe, with psychotic features ([place]) 03/04/2015  . Cannabis use disorder, severe, dependence ([place]) 03/04/2015  . Hypokalemia 03/04/2015  . Cocaine use disorder, severe, dependence ([place]) 03/03/2015  . Pneumothorax on right 01/02/2013  . Acid reflux 07/08/2012  . Malignant neoplasm of thyroid gland ([place]) 07/08/2012  . Other abnormal glucose 09/02/2011  . Plantar fasciitis, bilateral 07/22/2011  . Neuropathic pain of thigh, right 07/22/2011  . 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 Thyroid cancer ([place]) 04/27/2011    No Known Allergies  Prior to Admission medications   Medication Sig Start Date End Date Taking? Authorizing Provider  levothyroxine (SYNTHROID, LEVOTHROID) 200 MCG tablet Take by mouth. 05/31/15  Yes Historical Provider, MD  albuterol (PROVENTIL HFA;VENTOLIN HFA) 108 (90 BASE) MCG/ACT inhaler Inhale 1-2  puffs into the lungs every 6 (six) hours as needed for wheezing or shortness of breath. Patient not taking: Reported on 06/17/2016 03/08/15   Encarnacion Slates, NP  albuterol (PROVENTIL) (2.5 MG/3ML) 0.083% nebulizer solution Take 3 mLs (2.5 mg total) by nebulization every 4 (four) hours as needed for wheezing or shortness of breath. Patient not taking: Reported on 06/17/2016 03/08/15   Encarnacion Slates, NP  HYDROcodone-acetaminophen (NORCO) 5-325 MG tablet Take 1 tablet by mouth every 6 (six) hours as needed for moderate pain. Patient not taking: Reported on 06/17/2016 05/01/16    Dene Gentry, MD    Past Medical, Surgical Family and Social History reviewed and updated.    Objective:   Today's Vitals   06/17/16 1101  BP: 140/76  Pulse: 74  Temp: 97.8 F (36.6 C)  TempSrc: Oral  SpO2: 100%  Weight: 232 lb (105.2 kg)  Height: 6\' 2"  (1.88 m)    Wt Readings from Last 3 Encounters:  06/17/16 232 lb (105.2 kg)  05/01/16 240 lb (108.9 kg)  04/09/16 240 lb (108.9 kg)    Physical Exam  Constitutional: He is oriented to person, place, and time. He appears well-developed and well-nourished.  HENT:  Head: Normocephalic and atraumatic.  Eyes: Pupils are equal, round, and reactive to light.  Cardiovascular: Normal rate, regular rhythm, normal heart sounds and intact distal pulses.   Pulmonary/Chest: Effort normal and breath sounds normal.  Musculoskeletal:       Right knee: He exhibits decreased range of motion and bony tenderness. He exhibits no effusion. Tenderness found.  Neurological: He is alert and oriented to person, place, and time.  Skin: Skin is warm and dry.  Psychiatric: He has a normal mood and affect. His behavior is normal. Judgment and thought content normal.       Assessment & Plan:  1. Acute pain of right knee - CBC with Differential - Uric Acid - Sedimentation Rate -Start a trial of colchicine 0.6 x 2 today and then 0.6 daily. -Start Gabapentin 200 mg, 4 times daily  2. Screening, lipid - Lipid panel  3. Screening for diabetes mellitus - Comprehensive metabolic panel  4. Elevated serum creatinine -Will recheck today -Counseled regarding hydrating with 6-8 glasses of water per day.  Return for follow-up in two weeks for re-evaluation of knee pain Complete financial assistance applications in order for me to refer you to a specialist if knee pain persists.  Carroll Sage. Kenton Kingfisher, MSN, [facility] [address] [place], [place] [address] [phone]

## 2016-06-18 LAB — SEDIMENTATION RATE: SED RATE: 3 mm/h (ref 0–15)

## 2016-06-18 LAB — URIC ACID: URIC ACID, SERUM: 6.2 mg/dL (ref 4.0–8.0)

## 2016-07-02 ENCOUNTER — Ambulatory Visit: Payer: Self-pay | Admitting: Family Medicine

## 2016-07-23 ENCOUNTER — Ambulatory Visit: Payer: Self-pay | Admitting: Family Medicine

## 2016-11-11 ENCOUNTER — Emergency Department (HOSPITAL_COMMUNITY)
Admission: EM | Admit: 2016-11-11 | Discharge: 2016-11-11 | Disposition: A | Discharge status: Home / Self Care | Payer: Self-pay | Attending: Emergency Medicine | Admitting: Emergency Medicine

## 2016-11-11 ENCOUNTER — Encounter (HOSPITAL_COMMUNITY): Payer: Self-pay | Admitting: Emergency Medicine

## 2016-11-11 DIAGNOSIS — R319 Hematuria, unspecified: Secondary | ICD-10-CM | POA: Insufficient documentation

## 2016-11-11 DIAGNOSIS — Z85850 Personal history of malignant neoplasm of thyroid: Secondary | ICD-10-CM | POA: Insufficient documentation

## 2016-11-11 DIAGNOSIS — Z79899 Other long term (current) drug therapy: Secondary | ICD-10-CM | POA: Insufficient documentation

## 2016-11-11 DIAGNOSIS — F17210 Nicotine dependence, cigarettes, uncomplicated: Secondary | ICD-10-CM | POA: Insufficient documentation

## 2016-11-11 LAB — URINALYSIS, ROUTINE W REFLEX MICROSCOPIC
Bacteria, UA: NONE SEEN
Bilirubin Urine: NEGATIVE
Glucose, UA: NEGATIVE mg/dL
Ketones, ur: NEGATIVE mg/dL
Leukocytes, UA: NEGATIVE
Nitrite: NEGATIVE
Protein, ur: NEGATIVE mg/dL
Specific Gravity, Urine: 1.021 (ref 1.005–1.030)
Squamous Epithelial / LPF: NONE SEEN
pH: 5 (ref 5.0–8.0)

## 2016-11-11 LAB — CBC WITH DIFFERENTIAL/PLATELET
BASOS PCT: 1 %
Basophils Absolute: 0.1 10*3/uL (ref 0.0–0.1)
EOS PCT: 5 %
Eosinophils Absolute: 0.4 10*3/uL (ref 0.0–0.7)
HCT: 40 % (ref 39.0–52.0)
Hemoglobin: 14.1 g/dL (ref 13.0–17.0)
Lymphocytes Relative: 33 %
Lymphs Abs: 2.6 10*3/uL (ref 0.7–4.0)
MCH: 30.3 pg (ref 26.0–34.0)
MCHC: 35.3 g/dL (ref 30.0–36.0)
MCV: 85.8 fL (ref 78.0–100.0)
Monocytes Absolute: 0.7 10*3/uL (ref 0.1–1.0)
Monocytes Relative: 9 %
Neutro Abs: 4.2 10*3/uL (ref 1.7–7.7)
Neutrophils Relative %: 52 %
PLATELETS: 332 10*3/uL (ref 150–400)
RBC: 4.66 MIL/uL (ref 4.22–5.81)
RDW: 13.4 % (ref 11.5–15.5)
WBC: 7.8 10*3/uL (ref 4.0–10.5)

## 2016-11-11 LAB — RPR: RPR Ser Ql: NONREACTIVE

## 2016-11-11 LAB — COMPREHENSIVE METABOLIC PANEL
ALBUMIN: 4.1 g/dL (ref 3.5–5.0)
ALT: 22 U/L (ref 17–63)
ANION GAP: 6 (ref 5–15)
AST: 21 U/L (ref 15–41)
Alkaline Phosphatase: 56 U/L (ref 38–126)
BUN: 16 mg/dL (ref 6–20)
CHLORIDE: 107 mmol/L (ref 101–111)
CO2: 28 mmol/L (ref 22–32)
Calcium: 8.8 mg/dL — ABNORMAL LOW (ref 8.9–10.3)
Creatinine, Ser: 1.28 mg/dL — ABNORMAL HIGH (ref 0.61–1.24)
GFR calc non Af Amer: >60 mL/min (ref >60)
GLUCOSE: 98 mg/dL (ref 65–99)
Potassium: 3.9 mmol/L (ref 3.5–5.1)
SODIUM: 141 mmol/L (ref 135–145)
Total Bilirubin: 0.4 mg/dL (ref 0.3–1.2)
Total Protein: 7.2 g/dL (ref 6.5–8.1)

## 2016-11-11 LAB — LIPASE, BLOOD: Lipase: 44 U/L (ref 11–51)

## 2016-11-11 LAB — HIV ANTIBODY (ROUTINE TESTING W REFLEX): HIV SCREEN 4TH GENERATION: NONREACTIVE

## 2016-11-11 MED ORDER — AZITHROMYCIN 250 MG PO TABS
1000.0000 mg | ORAL_TABLET | Freq: Once | ORAL | Status: AC
  Administered 2016-11-11: 1000 mg via ORAL
  Filled 2016-11-11: qty 4

## 2016-11-11 MED ORDER — CEFTRIAXONE SODIUM 250 MG IJ SOLR
250.0000 mg | Freq: Once | INTRAMUSCULAR | Status: AC
  Administered 2016-11-11: 250 mg via INTRAMUSCULAR
  Filled 2016-11-11: qty 250

## 2016-11-11 MED ORDER — STERILE WATER FOR INJECTION IJ SOLN
INTRAMUSCULAR | Status: AC
  Administered 2016-11-11: 10 mL
  Filled 2016-11-11: qty 10

## 2016-11-11 NOTE — Discharge Instructions (Signed)
You need to have a repeat urinalysis in 2 weeks to make sure the blood in your urine has cleared.
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
## 2016-11-11 NOTE — ED Triage Notes (Signed)
Pt states he noticed he had blood in his urine this morning  Pt states he had unprotected sex a while ago and has been feeling different for some time  Pt states he also has had some abd pain/cramping

## 2016-11-12 LAB — GC/CHLAMYDIA PROBE AMP (~~LOC~~) NOT AT ARMC
Chlamydia: NEGATIVE
Neisseria Gonorrhea: NEGATIVE

## 2016-11-16 ENCOUNTER — Emergency Department (HOSPITAL_COMMUNITY)
Admission: EM | Admit: 2016-11-16 | Discharge: 2016-11-16 | Disposition: A | Discharge status: Home / Self Care | Payer: Self-pay | Attending: Emergency Medicine | Admitting: Emergency Medicine

## 2016-11-16 ENCOUNTER — Encounter (HOSPITAL_COMMUNITY): Payer: Self-pay | Admitting: Emergency Medicine

## 2016-11-16 DIAGNOSIS — J45909 Unspecified asthma, uncomplicated: Secondary | ICD-10-CM | POA: Insufficient documentation

## 2016-11-16 DIAGNOSIS — Z79899 Other long term (current) drug therapy: Secondary | ICD-10-CM | POA: Insufficient documentation

## 2016-11-16 DIAGNOSIS — R319 Hematuria, unspecified: Secondary | ICD-10-CM | POA: Insufficient documentation

## 2016-11-16 DIAGNOSIS — Z85850 Personal history of malignant neoplasm of thyroid: Secondary | ICD-10-CM | POA: Insufficient documentation

## 2016-11-16 DIAGNOSIS — F17210 Nicotine dependence, cigarettes, uncomplicated: Secondary | ICD-10-CM | POA: Insufficient documentation

## 2016-11-16 LAB — URINALYSIS, ROUTINE W REFLEX MICROSCOPIC
BILIRUBIN URINE: NEGATIVE
Bacteria, UA: NONE SEEN
GLUCOSE, UA: NEGATIVE mg/dL
Ketones, ur: NEGATIVE mg/dL
LEUKOCYTES UA: NEGATIVE
NITRITE: NEGATIVE
Protein, ur: NEGATIVE mg/dL
SPECIFIC GRAVITY, URINE: 1.02 (ref 1.005–1.030)
SQUAMOUS EPITHELIAL / LPF: NONE SEEN
pH: 5 (ref 5.0–8.0)

## 2016-11-16 NOTE — ED Notes (Signed)
ED Provider at bedside. 

## 2016-11-16 NOTE — ED Triage Notes (Signed)
Pt states that he was here two days ago and continues to have blood in his urine. Alert and oriented.
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
## 2016-11-18 LAB — URINE CULTURE: Culture: <10000 — AB

## 2017-01-19 ENCOUNTER — Emergency Department (HOSPITAL_BASED_OUTPATIENT_CLINIC_OR_DEPARTMENT_OTHER)
Admission: EM | Admit: 2017-01-19 | Discharge: 2017-01-19 | Disposition: A | Discharge status: Home / Self Care | Payer: Self-pay | Attending: Emergency Medicine | Admitting: Emergency Medicine

## 2017-01-19 ENCOUNTER — Other Ambulatory Visit: Payer: Self-pay

## 2017-01-19 ENCOUNTER — Encounter (HOSPITAL_BASED_OUTPATIENT_CLINIC_OR_DEPARTMENT_OTHER): Payer: Self-pay | Admitting: Emergency Medicine

## 2017-01-19 DIAGNOSIS — F17210 Nicotine dependence, cigarettes, uncomplicated: Secondary | ICD-10-CM | POA: Insufficient documentation

## 2017-01-19 DIAGNOSIS — H9390 Unspecified disorder of ear, unspecified ear: Secondary | ICD-10-CM

## 2017-01-19 DIAGNOSIS — J45909 Unspecified asthma, uncomplicated: Secondary | ICD-10-CM | POA: Insufficient documentation

## 2017-01-19 DIAGNOSIS — H9391 Unspecified disorder of right ear: Secondary | ICD-10-CM | POA: Insufficient documentation

## 2017-01-19 DIAGNOSIS — Z85850 Personal history of malignant neoplasm of thyroid: Secondary | ICD-10-CM | POA: Insufficient documentation

## 2017-01-19 DIAGNOSIS — F1410 Cocaine abuse, uncomplicated: Secondary | ICD-10-CM | POA: Insufficient documentation

## 2017-01-19 DIAGNOSIS — F1210 Cannabis abuse, uncomplicated: Secondary | ICD-10-CM | POA: Insufficient documentation

## 2017-01-19 MED ORDER — SULFAMETHOXAZOLE-TRIMETHOPRIM 800-160 MG PO TABS: 1.0000 | ORAL_TABLET | Freq: Two times a day (BID) | ORAL | 0 refills | Status: AC

## 2017-01-19 MED ORDER — CEPHALEXIN 500 MG PO CAPS: 500.0000 mg | ORAL_CAPSULE | Freq: Four times a day (QID) | ORAL | 0 refills | Status: DC

## 2017-01-19 NOTE — ED Triage Notes (Signed)
Pt presents with c/o cyst to right ear

## 2017-01-19 NOTE — Discharge Instructions (Signed)
Were not sure what is causing the lesion to your ear. This could be infectious process, or other inflammatory process.  You could also be having a cyst. It is best that you get optimal evaluation by the ENT doctor. Please complete the course of antibiotics prior to the ENT evaluation, this will help expedite your workup.
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
## 2017-01-19 NOTE — ED Notes (Signed)
Pt discharged to home with family. NAD.  

## 2017-02-15 ENCOUNTER — Ambulatory Visit: Payer: Self-pay

## 2017-03-05 ENCOUNTER — Ambulatory Visit (HOSPITAL_COMMUNITY)
Admission: RE | Admit: 2017-03-05 | Discharge: 2017-03-05 | Disposition: A | Discharge status: Home / Self Care | Payer: No Typology Code available for payment source | Source: Ambulatory Visit | Attending: Family Medicine | Admitting: Family Medicine

## 2017-03-05 ENCOUNTER — Ambulatory Visit (INDEPENDENT_AMBULATORY_CARE_PROVIDER_SITE_OTHER): Payer: Self-pay | Admitting: Family Medicine

## 2017-03-05 ENCOUNTER — Encounter: Payer: Self-pay | Admitting: Family Medicine

## 2017-03-05 VITALS — BP 128/84 | HR 77 | Temp 98.7°F | Resp 18 | Ht 74.0 in | Wt 223.0 lb

## 2017-03-05 DIAGNOSIS — M25561 Pain in right knee: Secondary | ICD-10-CM | POA: Insufficient documentation

## 2017-03-05 DIAGNOSIS — J4520 Mild intermittent asthma, uncomplicated: Secondary | ICD-10-CM

## 2017-03-05 DIAGNOSIS — M542 Cervicalgia: Secondary | ICD-10-CM

## 2017-03-05 DIAGNOSIS — Z131 Encounter for screening for diabetes mellitus: Secondary | ICD-10-CM

## 2017-03-05 DIAGNOSIS — Z23 Encounter for immunization: Secondary | ICD-10-CM

## 2017-03-05 LAB — POCT GLYCOSYLATED HEMOGLOBIN (HGB A1C)

## 2017-03-05 MED ORDER — ALBUTEROL SULFATE HFA 108 (90 BASE) MCG/ACT IN AERS: 1.0000 | INHALATION_SPRAY | Freq: Four times a day (QID) | RESPIRATORY_TRACT | 0 refills | Status: DC | PRN

## 2017-03-05 MED ORDER — PREDNISONE 20 MG PO TABS: ORAL_TABLET | ORAL | 0 refills | Status: DC

## 2017-03-05 MED ORDER — BUDESONIDE-FORMOTEROL FUMARATE 80-4.5 MCG/ACT IN AERO: 2.0000 | INHALATION_SPRAY | Freq: Two times a day (BID) | RESPIRATORY_TRACT | 3 refills | Status: DC

## 2017-03-05 MED ORDER — GABAPENTIN 100 MG PO CAPS: 200.0000 mg | ORAL_CAPSULE | Freq: Four times a day (QID) | ORAL | 3 refills | Status: DC

## 2017-03-05 MED FILL — predniSONE 20 MG TABS: 20 | 9 days supply | Qty: 18 | Fill #0

## 2017-03-05 MED FILL — GABAPENTIN 100 MG CAPSULE: 100 | 15 days supply | Qty: 120 | Fill #0

## 2017-03-05 NOTE — Patient Instructions (Addendum)
Obtain right knee xray and I will follow-up with you by phone if anything concerning is noted on image.  -I recommend wearing a knee brace with any prolonged standing and or walking.  Neck pain/ stiffness likely secondary to inflammation   Elbow is likely hurting due to inflammation.  For all symptoms, I am prescribing prednisone taper. Take as directed.  I am also resuming gabapentin 200 mg, 4 times daily.

## 2017-03-05 NOTE — Progress Notes (Signed)
Patient ID: Nicholas Mccarty, male    DOB: 1970-10-16, 46 y.o.   MRN: [id number]  PCP: Scot Jun, FNP  Chief Complaint  Patient presents with  . Neck Pain  . Elbow Pain  . Asthma  . Knee Pain    Subjective:  HPI Nicholas Mccarty is a 46 y.o. male presents for evaluation of multiple complaints including neck, elbow, knee pain and asthma symptoms.   Knee Pain  This is not a new problem. Nicholas Mccarty complains of ongoing right knee pain. Reports over a few weeks ago he experienced a injury that resulted in a broken patellar. [place] and has not had any recent orthopedic follow-up. Nicholas Mccarty has experienced knee pain chronically for over 1 year and he was previously followed by [facility]. He reports knee pain is only exacerbated with strenuous activity.    Neck Pain Nicholas Mccarty complains of neck stiffness ongoing for over 2 months. Neck stiffness is most pronounced with rotating and flexion neck. He denies prior injury or surgery. He has not attempted relief with any medications or heat applications.  Nicholas Mccarty has a history of thyroid cancer and is follow-ups routinely with [facility] speciality.   Right Elbow Pain Nicholas Mccarty complains of right elbow pain. Pain is exacerbated with bending right arm and with right hand gripping motions. He denies injury, direct trauma to elbow, or swelling. He has not attempted relief with any of the medication or nonpharmacologic treatments.   Asthma  Nicholas Mccarty reports a history of asthma. Of recently he has experienced intermittent shortness of breath, wheezing, and cough. He is a social and non-everyday smoker. Reports occasional chest tightness when symptoms are present. He is out of his nebulizer solution and inhalers. Denies any prior pulmonology speciality evaluation or management. Social History   Socioeconomic History  . Marital status: Single    Spouse name: Not on file  . Number of children: Not on file  .  Years of education: Not on file  . Highest education level: Not on file  Social Needs  . Financial resource strain: Not on file  . Food insecurity - worry: Not on file  . Food insecurity - inability: Not on file  . Transportation needs - medical: Not on file  . Transportation needs - non-medical: Not on file  Occupational History  . Not on file  Tobacco Use  . Smoking status: Current Some Day Smoker    Packs/day: 0.50    Types: Cigarettes  . Smokeless tobacco: Never Used  Substance and Sexual Activity  . Alcohol use: Yes    Comment: occ  . Drug use: Yes    Types: "Crack" cocaine, Marijuana    Comment: Daily  . Sexual activity: Yes  Other Topics Concern  . Not on file  Social History Narrative  . Not on file    Family History  Problem Relation Age of Onset  . Cancer Neg Hx   . Heart disease Neg Hx   . Hyperlipidemia Neg Hx   . Hypertension Neg Hx   . Diabetes Neg Hx   . Stroke Neg Hx   . Mental illness Neg Hx    Review of Systems  Constitutional: Negative.   Eyes: Negative.   Respiratory:       Intermittent wheezing, SOB, chest tightness, and cough  Cardiovascular: Negative.   Endocrine: Negative.   Genitourinary: Negative.   Musculoskeletal: Positive for arthralgias, neck pain and neck stiffness.  Skin: Negative.   Neurological: Negative for headaches.  Hematological: Negative.   Psychiatric/Behavioral: Negative.     Patient Active Problem List   Diagnosis Date Noted  . Right knee pain 04/12/2016  . 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 Bipolar disorder, curr episode mixed, severe, with psychotic features ([place]) 03/04/2015  . Cannabis use disorder, severe, dependence ([place]) 03/04/2015  . Hypokalemia 03/04/2015  . Cocaine use disorder, severe, dependence ([place]) 03/03/2015  . Pneumothorax on right 01/02/2013  . Acid reflux 07/08/2012  . Malignant neoplasm of thyroid gland ([place]) 07/08/2012  . Other abnormal glucose 09/02/2011  . Plantar  fasciitis, bilateral 07/22/2011  . Neuropathic pain of thigh, right 07/22/2011  . 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 Thyroid cancer ([place]) 04/27/2011    No Known Allergies  Prior to Admission medications   Medication Sig Start Date End Date Taking? Authorizing Provider  levothyroxine (SYNTHROID, LEVOTHROID) 200 MCG tablet Take 200 mcg by mouth daily before breakfast.  05/31/15  Yes [provider]  ranitidine (ZANTAC) 150 MG tablet Take 150 mg by mouth 2 (two) times daily.   Yes [provider]  albuterol (PROVENTIL HFA;VENTOLIN HFA) 108 (90 Base) MCG/ACT inhaler Inhale 1-2 puffs into the lungs every 6 (six) hours as needed for wheezing or shortness of breath. Patient not taking: Reported on 03/05/2017 06/17/16   Scot Jun, FNP  budesonide-formoterol [facility]) 80-4.5 MCG/ACT inhaler Inhale 2 puffs into the lungs 2 (two) times daily. Patient not taking: Reported on 11/16/2016 06/17/16   Scot Jun, FNP  colchicine 0.6 MG tablet Take 1 tablet (0.6 mg total) by mouth daily. Patient not taking: Reported on 11/16/2016 06/17/16   Scot Jun, FNP  gabapentin (NEURONTIN) 100 MG capsule Take 2 capsules (200 mg total) by mouth 4 (four) times daily. Patient not taking: Reported on 11/16/2016 06/17/16 07/17/16  Scot Jun, FNP  HYDROcodone-acetaminophen (NORCO) 5-325 MG tablet Take 1 tablet by mouth every 6 (six) hours as needed for moderate pain. Patient not taking: Reported on 06/17/2016 05/01/16   Dene Gentry, MD    Past Medical, Surgical Family and Social History reviewed and updated.    Objective:   Today's Vitals   03/05/17 0922  BP: 128/84  Pulse: 77  Resp: 18  Temp: 98.7 F (37.1 C)  TempSrc: Oral  SpO2: 99%  Weight: 223 lb (101.2 kg)  Height: 6\' 2"  (1.88 m)    Wt Readings from Last 3 Encounters:  03/05/17 223 lb (101.2 kg)  11/11/16 215 lb (97.5 kg)  06/17/16 232 lb (105.2 kg)    Physical Exam  Constitutional: He is oriented to  person, place, and time. He appears well-developed and well-nourished.  HENT:  Head: Normocephalic and atraumatic.  Mouth/Throat: Oropharynx is clear and moist.  Eyes: Conjunctivae and EOM are normal. Pupils are equal, round, and reactive to light.  Neck: Neck supple. No thyromegaly present.  Cardiovascular: Normal rate, regular rhythm and normal heart sounds.  Pulmonary/Chest: Effort normal and breath sounds normal.  Abdominal: Soft. Bowel sounds are normal.  Musculoskeletal:       Right elbow: He exhibits swelling. Tenderness found. Medial epicondyle and lateral epicondyle tenderness noted.       Right knee: He exhibits normal range of motion, no swelling and normal patellar mobility. No tenderness found.  Lymphadenopathy:    He has no cervical adenopathy.  Neurological: He is alert and oriented to person, place, and time.  Skin: Skin is warm and dry.  Psychiatric: He has a  normal mood and affect. His behavior is normal. Judgment and thought content normal.   Assessment & Plan:  1. Right knee pain, unspecified chronicity, reports recent patellar fracture a few weeks prior. No recent follow-up with orthopedics. Will obtain right knee images to rule out any acute fractures or delay healing. Patient given the [place] Application to complete in order to obtain a orthopedic referral if warranted.  - trial prednisone tape x 9 days.  2. Neck pain, symptoms consistent with neck strain. Recommend heat compresses, trial prednisone taper, and gabapentin 300 mg, three times daily.   3. Need for immunization against influenza- Flu Vaccine QUAD 36+ mos IM  4. Screening for diabetes mellitus, A1C 5.3, normal, recheck in 12 months.  5. Mild intermittent asthma, unspecified whether complicated, will resume albuterol and trial Symbicort to improve symptom control.   Meds ordered this encounter  Medications  . albuterol (PROVENTIL HFA;VENTOLIN HFA) 108 (90 Base) MCG/ACT  inhaler    Sig: Inhale 1-2 puffs into the lungs every 6 (six) hours as needed for wheezing or shortness of breath.    Dispense:  1 Inhaler    Refill:  0    Order Specific Question:   Supervising Provider    Answer:   Tresa Garter [id number]  . budesonide-formoterol (SYMBICORT) 80-4.5 MCG/ACT inhaler    Sig: Inhale 2 puffs into the lungs 2 (two) times daily.    Dispense:  1 Inhaler    Refill:  3    Order Specific Question:   Supervising Provider    Answer:   Tresa Garter [id number]  . predniSONE (DELTASONE) 20 MG tablet    Sig: Take 3 PO QAM x3days, 2 PO QAM x3days, 1 PO QAM x3days    Dispense:  18 tablet    Refill:  0    Order Specific Question:   Supervising Provider    Answer:   Tresa Garter [id number]  . gabapentin (NEURONTIN) 100 MG capsule    Sig: Take 2 capsules (200 mg total) by mouth 4 (four) times daily.    Dispense:  120 capsule    Refill:  3    Order Specific Question:   Supervising Provider    Answer:   Tresa Garter [id number]    Orders Placed This Encounter  Procedures  . DG Knee Complete 4 Views Right  . Flu Vaccine QUAD 36+ mos IM  . POCT glycosylated hemoglobin (Hb A1C)    RTC:  3 months for chronic condition management-MSK pain   Carroll Sage. Kenton Kingfisher, MSN, FNP-C [facility]  [address] [place], [place] [address] [phone]

## 2017-03-07 ENCOUNTER — Encounter: Payer: Self-pay | Admitting: Family Medicine

## 2017-03-07 NOTE — Progress Notes (Signed)
Mail lab letter  

## 2017-03-17 ENCOUNTER — Telehealth: Payer: Self-pay

## 2017-03-17 NOTE — Telephone Encounter (Signed)
Patient states he is unable to fill the Symbicort because [facility] needs a letter from the [employer] and they are shut down right now. Patient would like to know if something else can be sent for hie Asthma

## 2017-03-18 MED ORDER — FLUTICASONE-SALMETEROL 100-50 MCG/DOSE IN AEPB: 1.0000 | INHALATION_SPRAY | Freq: Two times a day (BID) | RESPIRATORY_TRACT | 3 refills | Status: DC

## 2017-03-18 MED FILL — **ADVAIR 100/50 DISKUS: 100-50 MCG | 28 days supply | Qty: 56 | Fill #0

## 2017-03-18 NOTE — Telephone Encounter (Signed)
Patient notified and will pick-up medications

## 2017-03-18 NOTE — Telephone Encounter (Signed)
I will send over a prescription for Advair  however,  this medication may also  require proof of income. He will have the option of paying out of pocket or waiting until he provide documentation required to satisfy the financial statements. If he has acute symptoms of shortness of breath or wheezing he should continue use of his short-acting inhaler as prescribed.  Nicholas Mccarty. Nicholas Kingfisher, MSN, FNP-C [facility]  [address] [place], [place] [address] [phone]

## 2017-05-17 MED FILL — !ADVAIR 100/50 DISKUS: 100-50 | 30 days supply | Qty: 60 | Fill #1

## 2017-06-04 ENCOUNTER — Ambulatory Visit: Payer: Self-pay | Admitting: Family Medicine

## 2017-06-05 ENCOUNTER — Encounter (HOSPITAL_BASED_OUTPATIENT_CLINIC_OR_DEPARTMENT_OTHER): Payer: Self-pay | Admitting: *Deleted

## 2017-06-05 ENCOUNTER — Emergency Department (HOSPITAL_BASED_OUTPATIENT_CLINIC_OR_DEPARTMENT_OTHER)
Admission: EM | Admit: 2017-06-05 | Discharge: 2017-06-05 | Disposition: A | Discharge status: Home / Self Care | Payer: No Typology Code available for payment source | Attending: Emergency Medicine | Admitting: Emergency Medicine

## 2017-06-05 ENCOUNTER — Emergency Department (HOSPITAL_BASED_OUTPATIENT_CLINIC_OR_DEPARTMENT_OTHER): Payer: No Typology Code available for payment source

## 2017-06-05 ENCOUNTER — Other Ambulatory Visit: Payer: Self-pay

## 2017-06-05 DIAGNOSIS — R05 Cough: Secondary | ICD-10-CM

## 2017-06-05 DIAGNOSIS — E039 Hypothyroidism, unspecified: Secondary | ICD-10-CM | POA: Insufficient documentation

## 2017-06-05 DIAGNOSIS — F17210 Nicotine dependence, cigarettes, uncomplicated: Secondary | ICD-10-CM | POA: Insufficient documentation

## 2017-06-05 DIAGNOSIS — J111 Influenza due to unidentified influenza virus with other respiratory manifestations: Secondary | ICD-10-CM | POA: Insufficient documentation

## 2017-06-05 DIAGNOSIS — R059 Cough, unspecified: Secondary | ICD-10-CM

## 2017-06-05 DIAGNOSIS — R69 Illness, unspecified: Secondary | ICD-10-CM

## 2017-06-05 DIAGNOSIS — F1410 Cocaine abuse, uncomplicated: Secondary | ICD-10-CM | POA: Insufficient documentation

## 2017-06-05 DIAGNOSIS — Z85850 Personal history of malignant neoplasm of thyroid: Secondary | ICD-10-CM | POA: Insufficient documentation

## 2017-06-05 DIAGNOSIS — R0602 Shortness of breath: Secondary | ICD-10-CM | POA: Insufficient documentation

## 2017-06-05 DIAGNOSIS — M791 Myalgia, unspecified site: Secondary | ICD-10-CM | POA: Insufficient documentation

## 2017-06-05 DIAGNOSIS — R509 Fever, unspecified: Secondary | ICD-10-CM | POA: Insufficient documentation

## 2017-06-05 DIAGNOSIS — J45909 Unspecified asthma, uncomplicated: Secondary | ICD-10-CM | POA: Insufficient documentation

## 2017-06-05 MED ORDER — OSELTAMIVIR PHOSPHATE 75 MG PO CAPS: 75.0000 mg | ORAL_CAPSULE | Freq: Two times a day (BID) | ORAL | 0 refills | Status: AC

## 2017-06-05 MED ORDER — ONDANSETRON 4 MG PO TBDP: 4.0000 mg | ORAL_TABLET | Freq: Three times a day (TID) | ORAL | 0 refills | Status: DC | PRN

## 2017-06-05 MED ORDER — ACETAMINOPHEN 500 MG PO TABS
1000.0000 mg | ORAL_TABLET | Freq: Once | ORAL | Status: AC
  Administered 2017-06-05: 1000 mg via ORAL
  Filled 2017-06-05: qty 2

## 2017-06-05 MED ORDER — BENZONATATE 100 MG PO CAPS: 100.0000 mg | ORAL_CAPSULE | Freq: Three times a day (TID) | ORAL | 0 refills | Status: DC

## 2017-06-05 MED ORDER — ALBUTEROL SULFATE HFA 108 (90 BASE) MCG/ACT IN AERS
2.0000 | INHALATION_SPRAY | RESPIRATORY_TRACT | Status: DC
  Administered 2017-06-05: 2 via RESPIRATORY_TRACT
  Filled 2017-06-05: qty 6.7

## 2017-06-05 MED ORDER — IBUPROFEN 800 MG PO TABS: 800.0000 mg | ORAL_TABLET | Freq: Three times a day (TID) | ORAL | 0 refills | Status: DC | PRN

## 2017-06-05 MED ORDER — IPRATROPIUM-ALBUTEROL 0.5-2.5 (3) MG/3ML IN SOLN
3.0000 mL | Freq: Four times a day (QID) | RESPIRATORY_TRACT | Status: DC
  Administered 2017-06-05: 3 mL via RESPIRATORY_TRACT
  Filled 2017-06-05: qty 3

## 2017-06-05 MED ORDER — ALBUTEROL SULFATE HFA 108 (90 BASE) MCG/ACT IN AERS: 1.0000 | INHALATION_SPRAY | Freq: Four times a day (QID) | RESPIRATORY_TRACT | 0 refills | Status: DC | PRN

## 2017-06-05 NOTE — Discharge Instructions (Signed)

## 2017-06-05 NOTE — ED Triage Notes (Signed)
Pt reports he started with a cough on Thursday and now having chills/sweats. Unsure of fevers.  No meds PTA.

## 2017-06-05 NOTE — ED Notes (Signed)
Pt given d/c instructions as per chart. Rx x 5. Verbalizes understanding. No questions.
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
## 2017-07-13 ENCOUNTER — Ambulatory Visit: Payer: Self-pay | Admitting: Family Medicine

## 2017-07-16 ENCOUNTER — Ambulatory Visit (INDEPENDENT_AMBULATORY_CARE_PROVIDER_SITE_OTHER): Payer: No Typology Code available for payment source | Admitting: Family Medicine

## 2017-07-16 ENCOUNTER — Encounter: Payer: Self-pay | Admitting: Family Medicine

## 2017-07-16 VITALS — BP 117/79 | HR 78 | Temp 97.9°F | Resp 16 | Ht 74.0 in | Wt 216.0 lb

## 2017-07-16 DIAGNOSIS — G8929 Other chronic pain: Secondary | ICD-10-CM

## 2017-07-16 DIAGNOSIS — M2550 Pain in unspecified joint: Secondary | ICD-10-CM

## 2017-07-16 DIAGNOSIS — E039 Hypothyroidism, unspecified: Secondary | ICD-10-CM

## 2017-07-16 DIAGNOSIS — M25561 Pain in right knee: Secondary | ICD-10-CM

## 2017-07-16 DIAGNOSIS — M25512 Pain in left shoulder: Secondary | ICD-10-CM

## 2017-07-16 DIAGNOSIS — M542 Cervicalgia: Secondary | ICD-10-CM

## 2017-07-16 MED ORDER — KETOROLAC TROMETHAMINE 60 MG/2ML IM SOLN
60.0000 mg | Freq: Once | INTRAMUSCULAR | Status: AC
  Administered 2017-07-16: 60 mg via INTRAMUSCULAR

## 2017-07-16 MED ORDER — MELOXICAM 15 MG PO TABS: 15.0000 mg | ORAL_TABLET | Freq: Every day | ORAL | 0 refills | Status: DC

## 2017-07-16 MED ORDER — PREDNISONE 20 MG PO TABS: 20.0000 mg | ORAL_TABLET | Freq: Every day | ORAL | Status: DC

## 2017-07-16 MED ORDER — GABAPENTIN 300 MG PO CAPS: 300.0000 mg | ORAL_CAPSULE | Freq: Three times a day (TID) | ORAL | 3 refills | Status: DC

## 2017-07-16 MED FILL — GABAPENTIN 300 MG CAPSULE: 300 | 30 days supply | Qty: 90 | Fill #0

## 2017-07-16 MED FILL — MELOXICAM 15 MG TABLET: 15 | 30 days supply | Qty: 30 | Fill #0
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
## 2017-07-16 NOTE — Patient Instructions (Addendum)
We will follow-up by phone with any abnormal laboratory results. We will start a trial of meloxicam 15 mg daily for inflammation related to chronic pain of left shoulder and right knee. We will continue gabapentin at 300 mg every 8 hours for chronic pain.  You may warrant a referral to orthopedic services for further work-up and evaluation.  We will discuss at your one-month follow-up.  Will start a 5-day trial of prednisone for inflammation.

## 2017-07-17 ENCOUNTER — Other Ambulatory Visit: Payer: Self-pay | Admitting: Family Medicine

## 2017-07-17 DIAGNOSIS — E039 Hypothyroidism, unspecified: Secondary | ICD-10-CM

## 2017-07-17 LAB — ARTHRITIS PANEL
ANA: NEGATIVE
Rhuematoid fact SerPl-aCnc: <10 IU/mL (ref 0.0–13.9)
Sed Rate: 9 mm/hr (ref 0–15)
URIC ACID: 6.9 mg/dL (ref 3.7–8.6)

## 2017-07-17 LAB — THYROID PANEL WITH TSH
Free Thyroxine Index: 2.7 (ref 1.2–4.9)
T3 Uptake Ratio: 28 % (ref 24–39)
T4 TOTAL: 9.7 ug/dL (ref 4.5–12.0)
TSH: 0.022 u[IU]/mL — AB (ref 0.450–4.500)

## 2017-07-17 MED ORDER — LEVOTHYROXINE SODIUM 150 MCG PO TABS: 150.0000 ug | ORAL_TABLET | Freq: Every day | ORAL | 1 refills | Status: DC

## 2017-07-17 NOTE — Progress Notes (Signed)
Orders Placed This Encounter  Procedures  . TSH    Standing Status:   Future    Standing Expiration Date:   07/18/2018   Meds ordered this encounter  Medications  . levothyroxine (SYNTHROID, LEVOTHROID) 150 MCG tablet    Sig: Take 1 tablet (150 mcg total) by mouth daily before breakfast.    Dispense:  30 tablet    Refill:  [place]  MSN, FNP-C Patient [facility] [address] [place], [place] [address] [phone]

## 2017-07-19 ENCOUNTER — Telehealth: Payer: Self-pay

## 2017-07-19 MED FILL — LEVOTHYROXINE 150 MCG TAB: 150 | 30 days supply | Qty: 30 | Fill #0

## 2017-07-19 NOTE — Telephone Encounter (Signed)
-----   Message from Dorena Dew, [place] sent at 07/17/2017  7:52 AM EDT ----- Regarding: lab results Please inform patient that TSH is decreased. Will decrease levothyroxine to 150 mcg every morning on an empty stomach. Will recheck TSH in 4 weeks.   Donia Pounds  MSN, FNP-C Patient [facility] [address] [place], [place] [address] [phone]

## 2017-07-19 NOTE — Telephone Encounter (Signed)
Called and spoke with patient, advised that tsh is decreased and we will decrease levothyroxine to 150 mcg every morning on an empty stomach. Advised that we need to recheck TSH in 4 weeks and appointment was scheduled. Thanks!

## 2017-08-08 ENCOUNTER — Encounter (HOSPITAL_COMMUNITY): Payer: Self-pay | Admitting: Emergency Medicine

## 2017-08-08 ENCOUNTER — Emergency Department (HOSPITAL_COMMUNITY)
Admission: EM | Admit: 2017-08-08 | Discharge: 2017-08-09 | Disposition: A | Discharge status: Other Acute Inpatient Hospital | Payer: No Typology Code available for payment source | Attending: Emergency Medicine | Admitting: Emergency Medicine

## 2017-08-08 ENCOUNTER — Emergency Department (HOSPITAL_COMMUNITY): Payer: No Typology Code available for payment source

## 2017-08-08 DIAGNOSIS — F17210 Nicotine dependence, cigarettes, uncomplicated: Secondary | ICD-10-CM | POA: Insufficient documentation

## 2017-08-08 DIAGNOSIS — Z79899 Other long term (current) drug therapy: Secondary | ICD-10-CM | POA: Insufficient documentation

## 2017-08-08 DIAGNOSIS — J45909 Unspecified asthma, uncomplicated: Secondary | ICD-10-CM | POA: Insufficient documentation

## 2017-08-08 DIAGNOSIS — F332 Major depressive disorder, recurrent severe without psychotic features: Secondary | ICD-10-CM | POA: Insufficient documentation

## 2017-08-08 DIAGNOSIS — Z85850 Personal history of malignant neoplasm of thyroid: Secondary | ICD-10-CM | POA: Insufficient documentation

## 2017-08-08 DIAGNOSIS — F1410 Cocaine abuse, uncomplicated: Secondary | ICD-10-CM

## 2017-08-08 DIAGNOSIS — R0789 Other chest pain: Secondary | ICD-10-CM | POA: Insufficient documentation

## 2017-08-08 DIAGNOSIS — F1220 Cannabis dependence, uncomplicated: Secondary | ICD-10-CM | POA: Insufficient documentation

## 2017-08-08 DIAGNOSIS — R079 Chest pain, unspecified: Secondary | ICD-10-CM

## 2017-08-08 DIAGNOSIS — F1420 Cocaine dependence, uncomplicated: Secondary | ICD-10-CM | POA: Insufficient documentation

## 2017-08-08 DIAGNOSIS — R45851 Suicidal ideations: Secondary | ICD-10-CM | POA: Insufficient documentation

## 2017-08-08 DIAGNOSIS — Z008 Encounter for other general examination: Secondary | ICD-10-CM | POA: Insufficient documentation

## 2017-08-08 LAB — I-STAT TROPONIN, ED
TROPONIN I, POC: 0.02 ng/mL (ref 0.00–0.08)
Troponin i, poc: 0.01 ng/mL (ref 0.00–0.08)

## 2017-08-08 LAB — BASIC METABOLIC PANEL
Anion gap: 12 (ref 5–15)
BUN: 15 mg/dL (ref 6–20)
CHLORIDE: 106 mmol/L (ref 101–111)
CO2: 25 mmol/L (ref 22–32)
Calcium: 8.8 mg/dL — ABNORMAL LOW (ref 8.9–10.3)
Creatinine, Ser: 1.4 mg/dL — ABNORMAL HIGH (ref 0.61–1.24)
GFR calc Af Amer: >60 mL/min (ref >60)
GFR calc non Af Amer: 58 mL/min — ABNORMAL LOW (ref >60)
GLUCOSE: 88 mg/dL (ref 65–99)
POTASSIUM: 3.3 mmol/L — AB (ref 3.5–5.1)
Sodium: 143 mmol/L (ref 135–145)

## 2017-08-08 LAB — RAPID URINE DRUG SCREEN, HOSP PERFORMED
AMPHETAMINES: POSITIVE — AB
Barbiturates: NOT DETECTED
Benzodiazepines: NOT DETECTED
COCAINE: POSITIVE — AB
OPIATES: NOT DETECTED
TETRAHYDROCANNABINOL: POSITIVE — AB

## 2017-08-08 LAB — CBC
HEMATOCRIT: 42.2 % (ref 39.0–52.0)
Hemoglobin: 14.6 g/dL (ref 13.0–17.0)
MCH: 30.2 pg (ref 26.0–34.0)
MCHC: 34.6 g/dL (ref 30.0–36.0)
MCV: 87.2 fL (ref 78.0–100.0)
Platelets: 293 10*3/uL (ref 150–400)
RBC: 4.84 MIL/uL (ref 4.22–5.81)
RDW: 13.7 % (ref 11.5–15.5)
WBC: 11.9 10*3/uL — ABNORMAL HIGH (ref 4.0–10.5)

## 2017-08-08 LAB — SALICYLATE LEVEL: Salicylate Lvl: <7 mg/dL (ref 2.8–30.0)

## 2017-08-08 LAB — ETHANOL

## 2017-08-08 LAB — ACETAMINOPHEN LEVEL: Acetaminophen (Tylenol), Serum: <10 ug/mL — ABNORMAL LOW (ref 10–30)

## 2017-08-08 MED ORDER — MOMETASONE FURO-FORMOTEROL FUM 100-5 MCG/ACT IN AERO
2.0000 | INHALATION_SPRAY | Freq: Two times a day (BID) | RESPIRATORY_TRACT | Status: DC
  Administered 2017-08-08: 2 via RESPIRATORY_TRACT
  Filled 2017-08-08: qty 8.8

## 2017-08-08 MED ORDER — FAMOTIDINE 20 MG PO TABS: 20.0000 mg | ORAL_TABLET | Freq: Every day | ORAL | Status: DC

## 2017-08-08 MED ORDER — GABAPENTIN 300 MG PO CAPS
300.0000 mg | ORAL_CAPSULE | Freq: Three times a day (TID) | ORAL | Status: DC
  Administered 2017-08-08: 300 mg via ORAL
  Filled 2017-08-08: qty 1

## 2017-08-08 MED ORDER — POTASSIUM CHLORIDE CRYS ER 20 MEQ PO TBCR
20.0000 meq | EXTENDED_RELEASE_TABLET | Freq: Once | ORAL | Status: AC
  Administered 2017-08-08: 20 meq via ORAL
  Filled 2017-08-08: qty 1

## 2017-08-08 MED ORDER — THIAMINE HCL 100 MG/ML IJ SOLN: 100.0000 mg | Freq: Every day | INTRAMUSCULAR | Status: DC

## 2017-08-08 MED ORDER — LEVOTHYROXINE SODIUM 150 MCG PO TABS
150.0000 ug | ORAL_TABLET | Freq: Every day | ORAL | Status: DC
  Filled 2017-08-08: qty 1

## 2017-08-08 MED ORDER — ALBUTEROL SULFATE HFA 108 (90 BASE) MCG/ACT IN AERS: 1.0000 | INHALATION_SPRAY | Freq: Four times a day (QID) | RESPIRATORY_TRACT | Status: DC | PRN

## 2017-08-08 MED ORDER — VITAMIN B-1 100 MG PO TABS
100.0000 mg | ORAL_TABLET | Freq: Every day | ORAL | Status: DC
  Administered 2017-08-08: 100 mg via ORAL
  Filled 2017-08-08: qty 1

## 2017-08-08 NOTE — ED Triage Notes (Signed)
Pt c/o chest tightness since yesterday. Reports done crack all day yesterday and today.

## 2017-08-08 NOTE — BH Assessment (Signed)
Pt is accepted to [facility] room 303-2.  The Dr will be Dr. Mallie Darting.

## 2017-08-08 NOTE — ED Notes (Signed)
Bed: WA09 Expected date:  Expected time:  Means of arrival:  Comments: Triage 1 

## 2017-08-08 NOTE — ED Triage Notes (Signed)
Pt reports having SI with thoughts of running out in front of a car.

## 2017-08-08 NOTE — ED Provider Notes (Signed)
11:30 PM Patient has been evaluated by TTS.  He has been accepted to behavioral health for continued psychiatric management.  Patient medically cleared.  Vitals:   08/08/17 2100 08/08/17 2107 08/08/17 2130 08/08/17 2200  BP: 112/70 112/70 109/66 106/74  Pulse: 76 73 79 79  Resp: 15 13 13 13   Temp:      TempSrc:      SpO2: 96% 96% 94% 96%  Weight:      Height:          Antonietta Breach, PA-C 08/08/17 2332    Margette Fast, MD 08/09/17 [phone]
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
## 2017-08-08 NOTE — Progress Notes (Signed)
Pt to room 42.  Alert and cooperative.  Denies pain or discomfort.  Passive SI with no current plan.  Pt contracts for safety. Pt receives snack and drink with medication. Pt to transfer to [facility] room 303-2.  Raquel Sarna RN given report and has asked for him to be sent over asap.
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
## 2017-08-09 ENCOUNTER — Other Ambulatory Visit: Payer: Self-pay

## 2017-08-09 ENCOUNTER — Inpatient Hospital Stay (HOSPITAL_COMMUNITY)
Admission: AD | Admit: 2017-08-09 | Discharge: 2017-08-13 | DRG: 885 | Disposition: A | Discharge status: Home / Self Care | Payer: No Typology Code available for payment source | Source: Intra-hospital | Attending: Psychiatry | Admitting: Psychiatry

## 2017-08-09 ENCOUNTER — Encounter (HOSPITAL_COMMUNITY): Payer: Self-pay

## 2017-08-09 DIAGNOSIS — E890 Postprocedural hypothyroidism: Secondary | ICD-10-CM | POA: Diagnosis present

## 2017-08-09 DIAGNOSIS — F1420 Cocaine dependence, uncomplicated: Secondary | ICD-10-CM

## 2017-08-09 DIAGNOSIS — I10 Essential (primary) hypertension: Secondary | ICD-10-CM | POA: Diagnosis present

## 2017-08-09 DIAGNOSIS — F1494 Cocaine use, unspecified with cocaine-induced mood disorder: Secondary | ICD-10-CM

## 2017-08-09 DIAGNOSIS — R440 Auditory hallucinations: Secondary | ICD-10-CM | POA: Diagnosis not present

## 2017-08-09 DIAGNOSIS — I160 Hypertensive urgency: Secondary | ICD-10-CM | POA: Diagnosis not present

## 2017-08-09 DIAGNOSIS — G4700 Insomnia, unspecified: Secondary | ICD-10-CM | POA: Diagnosis present

## 2017-08-09 DIAGNOSIS — F17210 Nicotine dependence, cigarettes, uncomplicated: Secondary | ICD-10-CM | POA: Diagnosis present

## 2017-08-09 DIAGNOSIS — Z79899 Other long term (current) drug therapy: Secondary | ICD-10-CM | POA: Diagnosis not present

## 2017-08-09 DIAGNOSIS — M2550 Pain in unspecified joint: Secondary | ICD-10-CM

## 2017-08-09 DIAGNOSIS — Z791 Long term (current) use of non-steroidal anti-inflammatories (NSAID): Secondary | ICD-10-CM | POA: Diagnosis not present

## 2017-08-09 DIAGNOSIS — G8929 Other chronic pain: Secondary | ICD-10-CM

## 2017-08-09 DIAGNOSIS — Z79890 Hormone replacement therapy (postmenopausal): Secondary | ICD-10-CM | POA: Diagnosis not present

## 2017-08-09 DIAGNOSIS — Z7951 Long term (current) use of inhaled steroids: Secondary | ICD-10-CM

## 2017-08-09 DIAGNOSIS — Z7952 Long term (current) use of systemic steroids: Secondary | ICD-10-CM

## 2017-08-09 DIAGNOSIS — F419 Anxiety disorder, unspecified: Secondary | ICD-10-CM | POA: Diagnosis present

## 2017-08-09 DIAGNOSIS — K219 Gastro-esophageal reflux disease without esophagitis: Secondary | ICD-10-CM | POA: Diagnosis present

## 2017-08-09 DIAGNOSIS — Z85850 Personal history of malignant neoplasm of thyroid: Secondary | ICD-10-CM

## 2017-08-09 DIAGNOSIS — M25512 Pain in left shoulder: Secondary | ICD-10-CM

## 2017-08-09 DIAGNOSIS — F1210 Cannabis abuse, uncomplicated: Secondary | ICD-10-CM | POA: Diagnosis present

## 2017-08-09 DIAGNOSIS — F1490 Cocaine use, unspecified, uncomplicated: Secondary | ICD-10-CM | POA: Diagnosis not present

## 2017-08-09 DIAGNOSIS — F1410 Cocaine abuse, uncomplicated: Secondary | ICD-10-CM | POA: Diagnosis present

## 2017-08-09 DIAGNOSIS — Z915 Personal history of self-harm: Secondary | ICD-10-CM | POA: Diagnosis not present

## 2017-08-09 DIAGNOSIS — J45909 Unspecified asthma, uncomplicated: Secondary | ICD-10-CM | POA: Diagnosis present

## 2017-08-09 DIAGNOSIS — F332 Major depressive disorder, recurrent severe without psychotic features: Principal | ICD-10-CM | POA: Diagnosis present

## 2017-08-09 DIAGNOSIS — E039 Hypothyroidism, unspecified: Secondary | ICD-10-CM

## 2017-08-09 DIAGNOSIS — Z79891 Long term (current) use of opiate analgesic: Secondary | ICD-10-CM

## 2017-08-09 DIAGNOSIS — M25561 Pain in right knee: Secondary | ICD-10-CM

## 2017-08-09 DIAGNOSIS — R45851 Suicidal ideations: Secondary | ICD-10-CM | POA: Diagnosis present

## 2017-08-09 MED ORDER — ALUM & MAG HYDROXIDE-SIMETH 200-200-20 MG/5ML PO SUSP: 30.0000 mL | ORAL | Status: DC | PRN

## 2017-08-09 MED ORDER — TRAZODONE HCL 50 MG PO TABS
50.0000 mg | ORAL_TABLET | Freq: Every evening | ORAL | Status: DC | PRN
  Administered 2017-08-09 – 2017-08-10 (×2): 50 mg via ORAL
  Filled 2017-08-09: qty 7
  Filled 2017-08-09: qty 1

## 2017-08-09 MED ORDER — GABAPENTIN 300 MG PO CAPS
300.0000 mg | ORAL_CAPSULE | Freq: Three times a day (TID) | ORAL | Status: DC
  Administered 2017-08-09 – 2017-08-13 (×14): 300 mg via ORAL
  Filled 2017-08-09 (×2): qty 1
  Filled 2017-08-09: qty 21
  Filled 2017-08-09 (×2): qty 1
  Filled 2017-08-09: qty 21
  Filled 2017-08-09 (×2): qty 1
  Filled 2017-08-09: qty 21
  Filled 2017-08-09 (×6): qty 1
  Filled 2017-08-09 (×2): qty 21
  Filled 2017-08-09 (×2): qty 1
  Filled 2017-08-09: qty 21
  Filled 2017-08-09 (×2): qty 1

## 2017-08-09 MED ORDER — ACETAMINOPHEN 325 MG PO TABS
650.0000 mg | ORAL_TABLET | Freq: Four times a day (QID) | ORAL | Status: DC | PRN
  Administered 2017-08-09 – 2017-08-13 (×4): 650 mg via ORAL
  Filled 2017-08-09 (×4): qty 2

## 2017-08-09 MED ORDER — MELOXICAM 15 MG PO TABS
15.0000 mg | ORAL_TABLET | Freq: Every day | ORAL | Status: DC
  Administered 2017-08-09 – 2017-08-12 (×4): 15 mg via ORAL
  Filled 2017-08-09 (×7): qty 1

## 2017-08-09 MED ORDER — COLCHICINE 0.6 MG PO TABS
0.6000 mg | ORAL_TABLET | Freq: Every day | ORAL | Status: DC
  Administered 2017-08-09 – 2017-08-12 (×4): 0.6 mg via ORAL
  Filled 2017-08-09 (×7): qty 1

## 2017-08-09 MED ORDER — LEVOTHYROXINE SODIUM 150 MCG PO TABS
150.0000 ug | ORAL_TABLET | Freq: Every day | ORAL | Status: DC
  Administered 2017-08-09 – 2017-08-13 (×5): 150 ug via ORAL
  Filled 2017-08-09: qty 1
  Filled 2017-08-09: qty 7
  Filled 2017-08-09: qty 1
  Filled 2017-08-09: qty 2
  Filled 2017-08-09: qty 1
  Filled 2017-08-09: qty 7
  Filled 2017-08-09 (×3): qty 1

## 2017-08-09 MED ORDER — ONDANSETRON 4 MG PO TBDP: 4.0000 mg | ORAL_TABLET | Freq: Four times a day (QID) | ORAL | Status: AC | PRN

## 2017-08-09 MED ORDER — LOPERAMIDE HCL 2 MG PO CAPS: 2.0000 mg | ORAL_CAPSULE | ORAL | Status: AC | PRN

## 2017-08-09 MED ORDER — MAGNESIUM HYDROXIDE 400 MG/5ML PO SUSP: 30.0000 mL | Freq: Every day | ORAL | Status: DC | PRN

## 2017-08-09 MED ORDER — HYDROXYZINE HCL 25 MG PO TABS
25.0000 mg | ORAL_TABLET | Freq: Four times a day (QID) | ORAL | Status: AC | PRN
  Administered 2017-08-09 – 2017-08-11 (×3): 25 mg via ORAL
  Filled 2017-08-09 (×2): qty 1

## 2017-08-09 MED ORDER — FAMOTIDINE 20 MG PO TABS
20.0000 mg | ORAL_TABLET | Freq: Two times a day (BID) | ORAL | Status: DC
  Administered 2017-08-09 – 2017-08-13 (×9): 20 mg via ORAL
  Filled 2017-08-09 (×13): qty 1

## 2017-08-09 MED ORDER — LORAZEPAM 1 MG PO TABS
1.0000 mg | ORAL_TABLET | Freq: Four times a day (QID) | ORAL | Status: AC | PRN
  Administered 2017-08-10: 1 mg via ORAL
  Filled 2017-08-09: qty 1

## 2017-08-09 NOTE — [facility]HH Suicide Risk Assessment (Signed)
[facility] Admission Suicide Risk Assessment   Nursing information obtained from:    Demographic factors:    Current Mental Status:    Loss Factors:    Historical Factors:    Risk Reduction Factors:     Total Time spent with patient: 45 minutes Principal Problem: <principal problem not specified> Diagnosis:   Patient Active Problem List   Diagnosis Date Noted  . Severe recurrent major depression without psychotic features ([place]) [F33.2] 08/09/2017  . Right knee pain [M25.561] 04/12/2016  . Cocaine-induced mood disorder ([place]) [F14.94] 03/15/2015  . Bipolar disorder, curr episode mixed, severe, with psychotic features ([place]) [F31.64] 03/04/2015  . Cannabis use disorder, severe, dependence ([place]) [F12.20] 03/04/2015  . Hypokalemia [E87.6] 03/04/2015  . Cocaine use disorder, severe, dependence ([place]) [F14.20] 03/03/2015  . Pneumothorax on right [J93.9] 01/02/2013  . Acid reflux [K21.9] 07/08/2012  . Malignant neoplasm of thyroid gland ([place]) [C73] 07/08/2012  . Other abnormal glucose [R73.09] 09/02/2011  . Plantar fasciitis, bilateral [M72.2] 07/22/2011  . Neuropathic pain of thigh, right [M79.2] 07/22/2011  . 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 Thyroid cancer ([place]) [C73] 04/27/2011   Subjective Data: Patient is seen and examined.  Patient is a 47 year old male with a past psychiatric history significant for cocaine dependence who presented to the [facility] emergency department with suicidal ideation.  He stated he had been clean of substances for about 6 months, and recently relapsed.  He had been using about $700 worth of cocaine a day by report.  He reported increased crying spells, feeling depressed, helpless and hopeless.  He lives with his fiance, but she was out of town.  The patient stated that he went to a place to kill himself yesterday, but changed his mind after he got there.  His urine drug screen was positive for cocaine, marijuana and  methamphetamines.  He was admitted to the hospital for evaluation and stabilization.  Continued Clinical Symptoms:  Alcohol Use Disorder Identification Test Final Score (AUDIT): 0 The "Alcohol Use Disorders Identification Test", Guidelines for Use in Primary Care, Second Edition.  World Pharmacologist [facility]). Score between 0-7:  no or low risk or alcohol related problems. Score between 8-15:  moderate risk of alcohol related problems. Score between 16-19:  high risk of alcohol related problems. Score 20 or above:  warrants further diagnostic evaluation for alcohol dependence and treatment.   CLINICAL FACTORS:   Alcohol/Substance Abuse/Dependencies   Musculoskeletal: Strength & Muscle Tone: within normal limits Gait & Station: unsteady Patient leans: N/A  Psychiatric Specialty Exam: Physical Exam  Constitutional: He is oriented to person, place, and time. He appears well-developed and well-nourished.  HENT:  Head: Normocephalic and atraumatic.  Respiratory: Effort normal.  Neurological: He is alert and oriented to person, place, and time.    ROS  Blood pressure (!) 123/95, pulse 88, temperature 98 F (36.7 C), temperature source Oral, resp. rate 20, height 6\' 1"  (2.831 m), weight 96.6 kg (213 lb), SpO2 98 %.Body mass index is 28.1 kg/m.  General Appearance: Disheveled  Eye Contact:  Poor  Speech:  Slow  Volume:  Decreased  Mood:  Dysphoric  Affect:  Congruent  Thought Process:  Coherent  Orientation:  Full (Time, Place, and Person)  Thought Content:  Logical  Suicidal Thoughts:  Yes.  without intent/plan  Homicidal Thoughts:  No  Memory:  Immediate;   Fair Recent;   Fair Remote;   Fair  Judgement:  Impaired  Insight:  Lacking  Psychomotor Activity:  Decreased  Concentration:  Concentration: Fair and Attention Span: Fair  Recall:  [employer] of Knowledge:  Fair  Language:  Fair  Akathisia:  Negative  Handed:  Right  AIMS (if indicated):     Assets:  Desire  for Improvement  ADL's:  Intact  Cognition:  WNL  Sleep:  Number of Hours: 3.75      COGNITIVE FEATURES THAT CONTRIBUTE TO RISK:  None    SUICIDE RISK:   Mild:  Suicidal ideation of limited frequency, intensity, duration, and specificity.  There are no identifiable plans, no associated intent, mild dysphoria and related symptoms, good self-control (both objective and subjective assessment), few other risk factors, and identifiable protective factors, including available and accessible social support.  PLAN OF CARE: Patient is seen and examined.  Patient is a 47 year old male with past psychiatric history significant for cocaine dependence, marijuana use disorder and methamphetamine use disorder.  He was admitted to the hospital because of suicidal ideation.  He is irritable, and does not feel well from his withdrawal.  He will be admitted to the hospital for evaluation and stabilization.  He will be integrated into the milieu.  He will be encouraged to go to groups.  He will meet with social work individually as well as in groups.  He will be monitored for suicidal ideation and is well withdrawal symptoms.  I certify that inpatient services furnished can reasonably be expected to improve the patient's condition.   Sharma Covert, MD 08/09/2017, 8:30 AM

## 2017-08-09 NOTE — Progress Notes (Signed)
Adult Psychoeducational Group Note  Date:  08/09/2017 Time:  2:46 PM  Group Topic/Focus:  Orientation:   The focus of this group is to educate the patient on the purpose and policies of crisis stabilization and provide a format to answer questions about their admission.  The group details unit policies and expectations of patients while admitted.  Participation Level:  Minimal  Participation Quality:  Appropriate  Affect:  Appropriate  Cognitive:  Appropriate  Insight: Appropriate  Engagement in Group:  Limited  Modes of Intervention:  Discussion  Additional Comments:  Pt attended morning orientation group. Seemed irritated and had no questions or comments about wellness. Did not have feedback for others.   Nicholas Mccarty 08/09/2017, 2:46 PM
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
## 2017-08-09 NOTE — Tx Team (Signed)
Initial Treatment Plan 08/09/2017 1:51 AM Nicholas Mccarty JIR:[id number]    PATIENT STRESSORS: Marital or family conflict Substance abuse,   PATIENT STRENGTHS: Ability for insight Active sense of humor Capable of independent living Communication skills General fund of knowledge Motivation for treatment/growth Special hobby/interest Supportive family/friends Work skills   PATIENT IDENTIFIED PROBLEMS: Substance-Use (Crack cocaine)   Depression   "Need to get clean and stay clean"   "Need to build relationship with myself"                DISCHARGE CRITERIA:  Ability to meet basic life and health needs Improved stabilization in mood, thinking, and/or behavior Verbal commitment to aftercare and medication compliance Withdrawal symptoms are absent or subacute and managed without 24-hour nursing intervention  PRELIMINARY DISCHARGE PLAN: Attend PHP/IOP Attend 12-step recovery group Outpatient therapy Return to previous living arrangement Return to previous work or school arrangements  PATIENT/FAMILY INVOLVEMENT: This treatment plan has been presented to and reviewed with the patient, Nicholas Mccarty. The patient have been given the opportunity to ask questions and make suggestions.  Lonia Skinner, RN 08/09/2017, 1:51 AM

## 2017-08-09 NOTE — Plan of Care (Signed)
  Problem: Activity: Goal: Interest or engagement in leisure activities will improve Outcome: Not Progressing Note:  Patient has been isolative to room today. Goal: Imbalance in normal sleep/wake cycle will improve Outcome: Not Progressing Note:  Patient has been sleeping the majority of the day.

## 2017-08-09 NOTE — Progress Notes (Signed)
D: Patient presents with flat, blunted affect and depressed mood.  He is irritable and has minimal interaction with staff and peers.  Patient reports passive SI with no specific plan.  Patient desires to work on his sobriety, as he was clean for six months before he relapsed on cocaine.  Patient is currently sleeping.  A: Continue to monitor medication management and MD orders.  Safety checks completed every 15 minutes per protocol.  Offer support and encouragement as needed.  R: Patient is isolative to room.
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
## 2017-08-09 NOTE — Progress Notes (Signed)
Admission Note:  Nicholas Mccarty is an 47 y.o. male.  Pt states he use about 700 dollars worth of cocaine and having suicidal thoughts to walk-in front of traffic. Pt is able to verbal contract for safety. Pt states he relapsed a week ago. Pt states he lives with girlfriend. 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 Pt states he is currently not seeing a counselor or psychiatrist. Pt denies HI/AVH/Pain at this time. UDS was positive for cocaine, marijuana, and amphetamine. Skin was assessed and found to be clear of any abnormal marks apart from a SF laceration to left forearm. Pt searched and no contraband found, POC and unit policies explained and understanding verbalized. Consents obtained. Food and fluids offered, and both accepted. Pt had no additional questions or concerns. Belongings in locker # 28. While at [facility], [place] states he would like to work on 1) "Getting clean and staying clean" and 2) "Building relationship with myself".

## 2017-08-10 DIAGNOSIS — R45851 Suicidal ideations: Secondary | ICD-10-CM

## 2017-08-10 DIAGNOSIS — F17210 Nicotine dependence, cigarettes, uncomplicated: Secondary | ICD-10-CM

## 2017-08-10 DIAGNOSIS — E039 Hypothyroidism, unspecified: Secondary | ICD-10-CM

## 2017-08-10 MED ORDER — DULOXETINE HCL 30 MG PO CPEP
30.0000 mg | ORAL_CAPSULE | Freq: Every day | ORAL | Status: DC
  Administered 2017-08-11: 30 mg via ORAL
  Filled 2017-08-10 (×4): qty 1

## 2017-08-10 MED ORDER — ALBUTEROL SULFATE HFA 108 (90 BASE) MCG/ACT IN AERS
1.0000 | INHALATION_SPRAY | RESPIRATORY_TRACT | Status: DC
  Filled 2017-08-10 (×2): qty 6.7

## 2017-08-10 MED ORDER — ALBUTEROL SULFATE HFA 108 (90 BASE) MCG/ACT IN AERS
1.0000 | INHALATION_SPRAY | Freq: Four times a day (QID) | RESPIRATORY_TRACT | Status: DC | PRN
  Administered 2017-08-10 – 2017-08-13 (×3): 2 via RESPIRATORY_TRACT

## 2017-08-10 MED ORDER — ALBUTEROL SULFATE HFA 108 (90 BASE) MCG/ACT IN AERS
1.0000 | INHALATION_SPRAY | Freq: Four times a day (QID) | RESPIRATORY_TRACT | Status: DC
  Filled 2017-08-10: qty 6.7

## 2017-08-10 NOTE — BHH Suicide Risk Assessment (Signed)
[place] INPATIENT:  Family/Significant Other Suicide Prevention Education  Suicide Prevention Education:  Patient Refusal for Family/Significant Other Suicide Prevention Education: The patient Nicholas Mccarty has refused to provide written consent for family/significant other to be provided Family/Significant Other Suicide Prevention Education during admission and/or prior to discharge.  Physician notified.  SPE completed with pt, as pt refused to consent to family contact. SPI pamphlet provided to pt and pt was encouraged to share information with support network, ask questions, and talk about any concerns relating to SPE. Pt denies access to guns/firearms and verbalized understanding of information provided. Mobile Crisis information also provided to pt.   Tascha Casares N Smart LCSW 08/10/2017, 11:27 AM

## 2017-08-10 NOTE — Progress Notes (Signed)
Pt complained of shortness of breath, which relieved with albuterol inhaler.

## 2017-08-10 NOTE — BHH Group Notes (Signed)
[facility] Group Therapy 08/10/2017 1:15pm  Type of Therapy: Mental Health Association Presentation  Participation Level: Invited. Chose to remain in bed.    [employer], LCSW 08/10/2017 2:39 PM
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
## 2017-08-10 NOTE — Progress Notes (Signed)
Nursing Progress Note: 7p-7a D: Pt currently presents with a anxious/silly/pleasant/cooperative affect and behavior. Pt states "This is going to be my last time here. I need to have my life in check. I can't let my choices fuck up everything else in my life." Interacting apporpriately with the milieu. Pt reports good sleep during the previous night with current medication regimen. Pt did attend wrap-up group.  A: Pt provided with medications per providers orders. Pt's labs and vitals were monitored throughout the night. Pt supported emotionally and encouraged to express concerns and questions. Pt educated on medications.  R: Pt's safety ensured with 15 minute and environmental checks. Pt currently denies SI, HI, and AVH. Pt verbally contracts to seek staff if SI,HI, or AVH occurs and to consult with staff before acting on any harmful thoughts. Will continue to monitor.

## 2017-08-10 NOTE — Progress Notes (Signed)
DAR NOTE: Patient presents with anxious affect and labile mood. Pt has been irritable most of the shift, has been on the phone most of the time, does not want to follow unit rules. Pt has been rude to staff, using F words toward staff. Reports fair night sleep, good appetite, low energy, and poor concentration. Denies pain, auditory and visual hallucinations.  Rates depression at 10, hopelessness at 10, and anxiety at 10.  Maintained on routine safety checks.  Medications given as prescribed.  Support and encouragement offered as needed. Will continue to monitor.

## 2017-08-11 MED ORDER — ARIPIPRAZOLE 5 MG PO TABS
5.0000 mg | ORAL_TABLET | Freq: Every day | ORAL | Status: DC
  Administered 2017-08-11 – 2017-08-13 (×3): 5 mg via ORAL
  Filled 2017-08-11: qty 7
  Filled 2017-08-11 (×5): qty 1
  Filled 2017-08-11: qty 7

## 2017-08-11 MED ORDER — DULOXETINE HCL 20 MG PO CPEP
40.0000 mg | ORAL_CAPSULE | Freq: Every day | ORAL | Status: DC
  Administered 2017-08-12: 40 mg via ORAL
  Filled 2017-08-11 (×3): qty 2
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
## 2017-08-11 NOTE — Plan of Care (Addendum)
Patient pleasant and smiling upon approach. Mood has improved since yesterday. Denies physical pain. Denies SI/HI/AVH but endorses auditory hallucinations. Patient claims the voices are mumbling and contracted for safety. Michela Pitcher he will let staff know if they become worse. Patient rates depression 7 out of 10, hopelessness 7 out of 10, and anxiety 7 out of 10. Patient's goal is to "stay clean" and he will do so by "praying." Patient compliant with all medications prescribed per MD. No questions or concerns. Safety maintained with 15 minute checks. Will continue to monitor.  Problem: Safety: Goal: Ability to remain free from injury will improve Outcome: Progressing  Patient remained safe throughout the shift.

## 2017-08-11 NOTE — Therapy (Addendum)
Adult Psychoeducational Group Note Occupational Therapy Group Treatment  Date:  08/11/2017 Time:  11:04 AM  Group Topic/Focus:  Stress Management  Participation Level:  Active  Participation Quality:  Attentive and Sharing  Affect:  Appropriate  Cognitive:  Appropriate  Insight: Improving  Engagement in Group:  Engaged  Modes of Intervention:  Activity and Education  Additional Comments:    S: "I need to learn to manage stress better"  O: Stress management group completed to use as productive coping strategy, to help mitigate maladaptive coping to integrate in functional BADL/IADL. Education given on the definition of stress and its cognitive, behavioral, emotional, and physical effects on the body. Stress management tools worksheet completed to identify negative coping mechanisms and their short and long term effects vs positive coping mechanisms with demonstration. Coping strategies taught include: relaxation based- deep breathing, counting to 10, taking a 1 minute vacation, acceptance, stress balls, relaxation audio/video, visual/mental imagery. Positive mental attitude- gratitude, acceptance, cognitive reframing, positive self talk, anger management. Self control circle activity completed to identify areas of control and areas not within personal control to facilitate acceptance in daily relationships and life. Adult coloring pages given at end of session.  A: Pt presented to group a little late, but engaged throughout activities. Pt initially with flat affect, improving with conversation and relation with other group members. Pt completed stress management tool worksheet, stating he would like to add "one minute vacations" as a stress reliving activity to his daily routine. Pt with insight that drugs/alcohol and suppression of emotions are maladaptive coping strategies that are limiting engagement in BADL/IADL and affecting relationships. Pt engaged in self control circle activity,  using it to color. Pt identified that he cannot control how his child's mother is a parent to his child, but he can control how he acts and engages with his child. Pt with much emotion on this topic, stating he will do what he needs to do to gain proper custody of his child. Pt also able to related with other group members on, he cannot control what others say/how they act, but he can control how he is acting. Pt took adult coloring page at end of session, stating this is a good stress reliever for him.  P:?Pt provided with education on stress management activities to implement into daily routine. Handouts given to facilitate carryover when reintegrating into community.     Zenovia Jarred, MSOT, OTR/L  [place] 08/11/2017, 11:04 AM

## 2017-08-11 NOTE — Progress Notes (Addendum)
Recreation Therapy Notes  Date: 5.29.19 Time: 0930 Location: [address] Dayroom  Group Topic: Stress Management  Goal Area(s) Addresses:  Patient will verbalize importance of using healthy stress management.  Patient will identify positive emotions associated with healthy stress management.   Intervention: Stress Management  Activity :  Meditation.  LRT played a meditation focusing on letting go of the past and living in the now.  Patients listened and followed along as the meditation played.  Education:  Stress Management, Discharge Planning.   Education Outcome: Acknowledges edcuation/In group clarification offered/Needs additional education  Clinical Observations/Feedback: Pt did not attend group.     Victorino Sparrow, LRT/CTRS         Victorino Sparrow A 08/11/2017 11:52 AM

## 2017-08-11 NOTE — Progress Notes (Addendum)
Nursing Progress Note: 7p-7a D: Pt currently presents with an anxious/labile affect and behavior. Pt states "I didn't wanna go to group. I just here to chill." Interacting appropriately with the milieu. Pt reports good sleep during the previous night with current medication regimen. Pt did not attend wrap-up group.  A: Pt provided with medications per providers orders. Pt's labs and vitals were monitored throughout the night. Pt supported emotionally and encouraged to express concerns and questions. Pt educated on medications.  R: Pt's safety ensured with 15 minute and environmental checks. Pt currently denies SI, HI, and VH and endorses AH of voices mumbling in his head. Pt sts it makes him labile. Pt verbally contracts to seek staff if SI,HI, or AVH occurs and to consult with staff before acting on any harmful thoughts. Will continue to monitor.

## 2017-08-11 NOTE — Tx Team (Signed)
Interdisciplinary Treatment and Diagnostic Plan Update  08/11/2017 Time of Session: 0830AM Nicholas Mccarty MRN: [id number]  Principal Diagnosis: Severe recurrent major depression without psychotic features [facility])  Secondary Diagnoses: Principal Problem:   Severe recurrent major depression without psychotic features ([place])   Current Medications:  Current Facility-Administered Medications  Medication Dose Route Frequency Provider Last Rate Last Dose  . acetaminophen (TYLENOL) tablet 650 mg  650 mg Oral Q6H PRN Rozetta Nunnery, NP   650 mg at 08/10/17 2159  . albuterol (PROVENTIL HFA;VENTOLIN HFA) 108 (90 Base) MCG/ACT inhaler 1-2 puff  1-2 puff Inhalation Q6H PRN Cobos, Myer Peer, MD   2 puff at 08/10/17 1730  . alum & mag hydroxide-simeth (MAALOX/MYLANTA) 200-200-20 MG/5ML suspension 30 mL  30 mL Oral Q4H PRN Lindon Romp A, NP      . colchicine tablet 0.6 mg  0.6 mg Oral Daily Derrill Center, NP   0.6 mg at 08/11/17 0747  . DULoxetine (CYMBALTA) DR capsule 30 mg  30 mg Oral Daily Cobos, Fernando A, MD      . famotidine (PEPCID) tablet 20 mg  20 mg Oral BID Lindon Romp A, NP   20 mg at 08/11/17 0747  . gabapentin (NEURONTIN) capsule 300 mg  300 mg Oral TID Lindon Romp A, NP   300 mg at 08/11/17 0747  . hydrOXYzine (ATARAX/VISTARIL) tablet 25 mg  25 mg Oral Q6H PRN Lindon Romp A, NP   25 mg at 08/10/17 2159  . levothyroxine (SYNTHROID, LEVOTHROID) tablet 150 mcg  150 mcg Oral QAC breakfast Lindon Romp A, NP   150 mcg at 08/11/17 0641  . loperamide (IMODIUM) capsule 2-4 mg  2-4 mg Oral PRN Lindon Romp A, NP      . LORazepam (ATIVAN) tablet 1 mg  1 mg Oral Q6H PRN Lindon Romp A, NP   1 mg at 08/10/17 1155  . magnesium hydroxide (MILK OF MAGNESIA) suspension 30 mL  30 mL Oral Daily PRN Lindon Romp A, NP      . meloxicam (MOBIC) tablet 15 mg  15 mg Oral Daily Lindon Romp A, NP   15 mg at 08/11/17 0747  . ondansetron (ZOFRAN-ODT) disintegrating tablet 4 mg  4 mg Oral Q6H PRN Lindon Romp A,  NP      . traZODone (DESYREL) tablet 50 mg  50 mg Oral QHS PRN Lindon Romp A, NP   50 mg at 08/10/17 2200   PTA Medications: Medications Prior to Admission  Medication Sig Dispense Refill Last Dose  . albuterol (PROVENTIL HFA;VENTOLIN HFA) 108 (90 Base) MCG/ACT inhaler Inhale 1-2 puffs into the lungs every 6 (six) hours as needed for wheezing or shortness of breath. 1 Inhaler 0 Past Month at Unknown time  . colchicine 0.6 MG tablet Take 1 tablet (0.6 mg total) by mouth daily. 60 tablet 1 Past Month at Unknown time  . gabapentin (NEURONTIN) 300 MG capsule Take 1 capsule (300 mg total) by mouth 3 (three) times daily. 90 capsule 3 Past Month at Unknown time  . HYDROcodone-acetaminophen (NORCO) 5-325 MG tablet Take 1 tablet by mouth every 6 (six) hours as needed for moderate pain. 20 tablet 0 Past Month at Unknown time  . levothyroxine (SYNTHROID, LEVOTHROID) 150 MCG tablet Take 1 tablet (150 mcg total) by mouth daily before breakfast. 30 tablet 1 Past Month at Unknown time  . ranitidine (ZANTAC) 150 MG tablet Take 150 mg by mouth 2 (two) times daily.   Past Month at Unknown time  .  meloxicam (MOBIC) 15 MG tablet Take 1 tablet (15 mg total) by mouth daily. 30 tablet 0     Patient Stressors: Marital or family conflict Substance abuse  Patient Strengths: Ability for insight Active sense of humor Capable of independent living Communication skills General fund of knowledge Motivation for treatment/growth Special hobby/interest Supportive family/friends Work skills  Treatment Modalities: Medication Management, Group therapy, Case management,  1 to 1 session with clinician, Psychoeducation, Recreational therapy.   Physician Treatment Plan for Primary Diagnosis: Severe recurrent major depression without psychotic features ([place]) Long Term Goal(s): Improvement in symptoms so as ready for discharge Improvement in symptoms so as ready for discharge   Short Term Goals: Ability to identify  changes in lifestyle to reduce recurrence of condition will improve Ability to disclose and discuss suicidal ideas Ability to identify and develop effective coping behaviors will improve Ability to maintain clinical measurements within normal limits will improve Ability to identify and develop effective coping behaviors will improve Ability to maintain clinical measurements within normal limits will improve Compliance with prescribed medications will improve Ability to identify triggers associated with substance abuse/mental health issues will improve  Medication Management: Evaluate patient's response, side effects, and tolerance of medication regimen.  Therapeutic Interventions: 1 to 1 sessions, Unit Group sessions and Medication administration.  Evaluation of Outcomes: Progressing  Physician Treatment Plan for Secondary Diagnosis: Principal Problem:   Severe recurrent major depression without psychotic features ([place])  Long Term Goal(s): Improvement in symptoms so as ready for discharge Improvement in symptoms so as ready for discharge   Short Term Goals: Ability to identify changes in lifestyle to reduce recurrence of condition will improve Ability to disclose and discuss suicidal ideas Ability to identify and develop effective coping behaviors will improve Ability to maintain clinical measurements within normal limits will improve Ability to identify and develop effective coping behaviors will improve Ability to maintain clinical measurements within normal limits will improve Compliance with prescribed medications will improve Ability to identify triggers associated with substance abuse/mental health issues will improve     Medication Management: Evaluate patient's response, side effects, and tolerance of medication regimen.  Therapeutic Interventions: 1 to 1 sessions, Unit Group sessions and Medication administration.  Evaluation of Outcomes: Progressing   RN Treatment Plan  for Primary Diagnosis: Severe recurrent major depression without psychotic features ([place]) Long Term Goal(s): Knowledge of disease and therapeutic regimen to maintain health will improve  Short Term Goals: Ability to remain free from injury will improve, Ability to participate in decision making will improve and Ability to disclose and discuss suicidal ideas  Medication Management: RN will administer medications as ordered by provider, will assess and evaluate patient's response and provide education to patient for prescribed medication. RN will report any adverse and/or side effects to prescribing provider.  Therapeutic Interventions: 1 on 1 counseling sessions, Psychoeducation, Medication administration, Evaluate responses to treatment, Monitor vital signs and CBGs as ordered, Perform/monitor CIWA, COWS, AIMS and Fall Risk screenings as ordered, Perform wound care treatments as ordered.  Evaluation of Outcomes: Progressing   LCSW Treatment Plan for Primary Diagnosis: Severe recurrent major depression without psychotic features ([place]) Long Term Goal(s): Safe transition to appropriate next level of care at discharge, Engage patient in therapeutic group addressing interpersonal concerns.  Short Term Goals: Engage patient in aftercare planning with referrals and resources, Facilitate patient progression through stages of change regarding substance use diagnoses and concerns and Identify triggers associated with mental health/substance abuse issues  Therapeutic Interventions: Assess for all discharge  needs, 1 to 1 time with Education officer, museum, Explore available resources and support systems, Assess for adequacy in community support network, Educate family and significant other(s) on suicide prevention, Complete Psychosocial Assessment, Interpersonal group therapy.  Evaluation of Outcomes: Progressing   Progress in Treatment: Attending groups: Intermittently  Participating in groups: Minimally, when he  attends Taking medication as prescribed: Yes. Toleration medication: Yes. Family/Significant other contact made: SPE completed with pt; pt declined to consent to collateral contact.  Patient understands diagnosis: Yes. Discussing patient identified problems/goals with staff: Yes. Medical problems stabilized or resolved: Yes. Denies suicidal/homicidal ideation: Yes. Issues/concerns per patient self-inventory: No. Other: n/a   New problem(s) identified: No, Describe:  n/a  New Short Term/Long Term Goal(s): detox, medication management for mood stabilization; elimination of SI thoughts; development of comprehensive mental wellness/sobriety plan.   Patient Goals:  "to get clean and stay clean."   Discharge Plan or Barriers: CSW assessing. [facility] referral made; pt on [facility] waitlist. [facility] appt made. [place] pamphlet, Mobile Crisis information, and AA/NA information provided to patient for additional community support and resources.   Reason for Continuation of Hospitalization: Anxiety Depression Medication stabilization Withdrawal symptoms Other; describe agitation  Estimated Length of Stay: Friday, 08/13/17  Attendees: Patient: Nicholas Mccarty 08/11/2017 8:48 AM  Physician: Dr. Nancy Fetter MD; Dr. Parke Poisson MD 08/11/2017 8:48 AM  Nursing: Wille Glaser RN; Roni RN 08/11/2017 8:48 AM  RN Care Manager:x 08/11/2017 8:48 AM  Social Worker: Press photographer, [place] 08/11/2017 8:48 AM  Recreational Therapist: x 08/11/2017 8:48 AM  Other: Lindell Spar NP 08/11/2017 8:48 AM  Other:  08/11/2017 8:48 AM  Other: 08/11/2017 8:48 AM    Scribe for Treatment Team: [place], LCSW 08/11/2017 8:48 AM

## 2017-08-12 DIAGNOSIS — F1490 Cocaine use, unspecified, uncomplicated: Secondary | ICD-10-CM

## 2017-08-12 DIAGNOSIS — F39 Unspecified mood [affective] disorder: Secondary | ICD-10-CM

## 2017-08-12 DIAGNOSIS — R440 Auditory hallucinations: Secondary | ICD-10-CM

## 2017-08-12 MED ORDER — DULOXETINE HCL 60 MG PO CPEP
60.0000 mg | ORAL_CAPSULE | Freq: Every day | ORAL | Status: DC
  Administered 2017-08-13: 60 mg via ORAL
  Filled 2017-08-12: qty 7
  Filled 2017-08-12 (×2): qty 1
  Filled 2017-08-12: qty 7

## 2017-08-12 MED ORDER — AMLODIPINE BESYLATE 5 MG PO TABS
5.0000 mg | ORAL_TABLET | Freq: Every day | ORAL | Status: DC
  Administered 2017-08-12 – 2017-08-13 (×2): 5 mg via ORAL
  Filled 2017-08-12 (×5): qty 1
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
## 2017-08-12 NOTE — Progress Notes (Signed)
Nursing Progress Note: 7p-7a D: Pt currently presents with an anxious/labile/animated/silly affect and behavior. Interacting appropriately with the milieu. Pt reports good sleep during the previous night with current medication regimen. Pt did attend wrap-up group.  A: Pt provided with medications per providers orders. Pt's labs and vitals were monitored throughout the night. Pt supported emotionally and encouraged to express concerns and questions. Pt educated on medications.  R: Pt's safety ensured with 15 minute and environmental checks. Pt currently denies SI, HI, and VH and endorses AH of voices mumbling in his head. Pt sts it makes him labile. Pt verbally contracts to seek staff if SI,HI, or AVH occurs and to consult with staff before acting on any harmful thoughts. Will continue to monitor.

## 2017-08-12 NOTE — Progress Notes (Signed)
Adult Psychoeducational Group Note  Date:  08/12/2017 Time:  8:47 PM  Group Topic/Focus:  Wrap-Up Group:   The focus of this group is to help patients review their daily goal of treatment and discuss progress on daily workbooks.  Participation Level:  Active  Participation Quality:  Appropriate  Affect:  Appropriate  Cognitive:  Appropriate  Insight: Appropriate  Engagement in Group:  Engaged  Modes of Intervention:  Discussion  Additional Comments:  Pt stated that today was a good day, he stated that he had a good visit with his family.   Sharmon Revere 08/12/2017, 8:47 PM

## 2017-08-12 NOTE — [facility] Group Notes (Signed)
Adult Psychoeducational Group Note  Date:  08/12/2017 Time:  4:43 PM  Group Topic/Focus:  Crisis Planning:   The purpose of this group is to help patients create a crisis plan for use upon discharge or in the future, as needed.  Participation Level:  Active  Participation Quality:  Appropriate  Affect:  Appropriate  Cognitive:  Appropriate  Insight: Good  Engagement in Group:  Distracting  Modes of Intervention:  Exploration  Additional Comments:    Dub Mikes 08/12/2017, 4:43 PM

## 2017-08-12 NOTE — [facility] Group Notes (Signed)
LCSW Group Therapy Note  08/12/2017 1:15pm  Type of Therapy/Topic:  Group Therapy:  Feelings about Diagnosis  Participation Level:  Active   Description of Group:   This group will allow patients to explore their thoughts and feelings about diagnoses they have received. Patients will be guided to explore their level of understanding and acceptance of these diagnoses. Facilitator will encourage patients to process their thoughts and feelings about the reactions of others to their diagnosis and will guide patients in identifying ways to discuss their diagnosis with significant others in their lives. This group will be process-oriented, with patients participating in exploration of their own experiences, giving and receiving support, and processing challenge from other group members.   Therapeutic Goals: 1. Patient will demonstrate understanding of diagnosis as evidenced by identifying two or more symptoms of the disorder 2. Patient will be able to express two feelings regarding the diagnosis 3. Patient will demonstrate their ability to communicate their needs through discussion and/or role play  Summary of Patient Progress:  Nicholas Mccarty was attentive and engaged during today's processing group. He shared that he is hoping to discharge on Friday "as long as they have my thyroid stuff worked out." Nicholas Mccarty shared that his mood has been much improved. [facility] and possible [facility] on Tuesday. Nicholas Mccarty continues to show improvement in the group setting with improving insight.   Therapeutic Modalities:   Cognitive Behavioral Therapy Brief Therapy Feelings Identification    Avelina Laine, LCSW 08/12/2017 10:57 AM

## 2017-08-12 NOTE — Plan of Care (Signed)
Patient self inventory: Patient slept well last night, appetite has been good, energy level has been normal, concentration has been good. Depression, hopelessness, and anxiety are rated 0 out of 10. Denies SI/HI/AVH. Denies physical pain. Patient's goal is to "work on getting custody of my child" and he will do this by "praying and meditating." Patient's mood seems to be improving. Patient has been silly and laughing with staff and other patients. Patient compliant with medication prescribed per MD. Safety maintained with 15 minute checks. Will continue to monitor.  Problem: Safety: Goal: Ability to remain free from injury will improve Outcome: Progressing  Patient has remained injury free for the shift.

## 2017-08-13 DIAGNOSIS — F332 Major depressive disorder, recurrent severe without psychotic features: Principal | ICD-10-CM

## 2017-08-13 LAB — TSH: TSH: 0.369 u[IU]/mL (ref 0.350–4.500)

## 2017-08-13 MED ORDER — ALBUTEROL SULFATE HFA 108 (90 BASE) MCG/ACT IN AERS: 1.0000 | INHALATION_SPRAY | Freq: Four times a day (QID) | RESPIRATORY_TRACT | 0 refills | Status: DC | PRN

## 2017-08-13 MED ORDER — AMLODIPINE BESYLATE 10 MG PO TABS: 10.0000 mg | ORAL_TABLET | Freq: Every day | ORAL | 0 refills | Status: DC

## 2017-08-13 MED ORDER — GABAPENTIN 300 MG PO CAPS
300.0000 mg | ORAL_CAPSULE | Freq: Three times a day (TID) | ORAL | 3 refills | Status: DC
Start: 2017-08-13 — End: 2017-11-05

## 2017-08-13 MED ORDER — DULOXETINE HCL 60 MG PO CPEP: 60.0000 mg | ORAL_CAPSULE | Freq: Every day | ORAL | 0 refills | Status: DC

## 2017-08-13 MED ORDER — AMLODIPINE BESYLATE 10 MG PO TABS
10.0000 mg | ORAL_TABLET | Freq: Every day | ORAL | Status: DC
  Filled 2017-08-13 (×2): qty 7
  Filled 2017-08-13: qty 1

## 2017-08-13 MED ORDER — MELOXICAM 15 MG PO TABS: 15.0000 mg | ORAL_TABLET | Freq: Every day | ORAL | 0 refills | Status: DC

## 2017-08-13 MED ORDER — ARIPIPRAZOLE 5 MG PO TABS: 5.0000 mg | ORAL_TABLET | Freq: Every day | ORAL | 0 refills | Status: DC

## 2017-08-13 MED ORDER — COLCHICINE 0.6 MG PO TABS: 0.6000 mg | ORAL_TABLET | Freq: Every day | ORAL | 0 refills | Status: DC

## 2017-08-13 MED ORDER — AMLODIPINE BESYLATE 5 MG PO TABS
5.0000 mg | ORAL_TABLET | Freq: Once | ORAL | Status: AC
  Administered 2017-08-13: 5 mg via ORAL
  Filled 2017-08-13 (×2): qty 1

## 2017-08-13 MED ORDER — TRAZODONE HCL 50 MG PO TABS: 50.0000 mg | ORAL_TABLET | Freq: Every evening | ORAL | 0 refills | Status: DC | PRN

## 2017-08-13 MED ORDER — LEVOTHYROXINE SODIUM 150 MCG PO TABS: 150.0000 ug | ORAL_TABLET | Freq: Every day | ORAL | 0 refills | Status: DC

## 2017-08-13 NOTE — Progress Notes (Signed)
Patient ID: Nicholas Mccarty, male   DOB: 08/01/1970, 47 y.o.   MRN: [id number] Patient discharged to home/self care.  Patient was very anxious about discharge and started to become irritable upon leaving.  Patient denies SI, HI and AVH upon discharge.  Patient acknowledged understanding of all discharge instructions and was discharged in the presence of family.

## 2017-08-13 NOTE — Progress Notes (Signed)
Patient with hypertensive urgency. Given amlodipine 10 mg by Psychiatry MD. Recommend close follow-up in 2-3 days for blood pressure recheck. Creatinine stable. No evidence of end-organ damage.  Cordelia Poche, MD [facility] 08/13/2017, 9:39 AM Pager: [phone]

## 2017-08-13 NOTE — Progress Notes (Signed)
Recreation Therapy Notes  Date: 5.31.19 Time: 0930 Location: 400 Hall Dayroom  Group Topic: Stress Management  Goal Area(s) Addresses:  Patient will verbalize importance of using healthy stress management.  Patient will identify positive emotions associated with healthy stress management.   Intervention: Stress Management  Activity :  Progressive Muscle Relaxation.  LRT lead group in progressive muscle relaxation.  Patients were to tense each muscle one at a time and then release the tension.  Patients were to follow along as LRT lead them through the activity.    Education:  Stress Management, Discharge Planning.   Education Outcome: Acknowledges edcuation/In group clarification offered/Needs additional education  Clinical Observations/Feedback:  Pt did not attend group.      Victorino Sparrow, LRT/CTRS         Victorino Sparrow A 08/13/2017 11:27 AM

## 2017-08-13 NOTE — Progress Notes (Addendum)
  [facility] Adult Case Management Discharge Plan :  Will you be returning to the same living situation after discharge:  Yes,  home until [facility] screening on Tuesday.  At discharge, do you have transportation home?: Yes,  Family member Do you have the ability to pay for your medications: Yes,  mental health  Release of information consent forms completed and submitted to medical records by CSW.  Patient to Follow up at: Follow-up Information    [facility] Follow up on 08/17/2017.   Why:  Screening for possible admission on Tuesday, 6/4 at 8:00AM. Please bring photo ID/proof of [employer] residency and 30 day supply of all prescribed medications.  Thank you.  Contact information: [address] [place] [address] [phone]        [facility] Follow up on 08/18/2017.   Specialty:  Behavioral Health Why:  Hospital follow-up on Wed 6/5 at 8:00AM. Please bring: photo ID, any proof of household income, and medication list. Thank you.  Contact information: [place] [address] [place] Follow up on 08/16/2017.   Specialty:  Internal Medicine Why:  Hospital follow-up appt on monday 6/3 at 11:00am with Kathe Becton.  Contact information: [place] [phone]          Next level of care provider has access to [place] and Suicide Prevention discussed: Yes,  SPE completed with pt; pt declined to consent to collateral contact. SPI pamphlet and mobile crisis informaton provided to pt.   Have you used any form of tobacco in the last 30 days? (Cigarettes, Smokeless Tobacco, Cigars, and/or Pipes): No  Has patient been referred to the Quitline?: N/A patient is not a smoker  Patient has been referred for addiction treatment: Yes  Avelina Laine, LCSW 08/13/2017, 11:30 AM
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
## 2017-08-13 NOTE — BHH Suicide Risk Assessment (Addendum)
[facility] Discharge Suicide Risk Assessment   Principal Problem:  Depression Discharge Diagnoses:  Patient Active Problem List   Diagnosis Date Noted  . Severe recurrent major depression without psychotic features ([place]) [F33.2] 08/09/2017  . Right knee pain [M25.561] 04/12/2016  . Cocaine-induced mood disorder ([place]) [F14.94] 03/15/2015  . Bipolar disorder, curr episode mixed, severe, with psychotic features ([place]) [F31.64] 03/04/2015  . Cannabis use disorder, severe, dependence ([place]) [F12.20] 03/04/2015  . Hypokalemia [E87.6] 03/04/2015  . Cocaine use disorder, severe, dependence ([place]) [F14.20] 03/03/2015  . Pneumothorax on right [J93.9] 01/02/2013  . Acid reflux [K21.9] 07/08/2012  . Malignant neoplasm of thyroid gland ([place]) [C73] 07/08/2012  . Other abnormal glucose [R73.09] 09/02/2011  . Plantar fasciitis, bilateral [M72.2] 07/22/2011  . Neuropathic pain of thigh, right [M79.2] 07/22/2011  . 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 Thyroid cancer ([place]) [C73] 04/27/2011    Total Time spent with patient: 30 minutes  Musculoskeletal: Strength & Muscle Tone: within normal limits Gait & Station: normal Patient leans: N/A  Psychiatric Specialty Exam: ROS no headache, no chest pain, no shortness of breath, no vomiting, no fever, no chills   Blood pressure (!) 141/108, pulse 81, temperature 97.8 F (36.6 C), temperature source Oral, resp. rate 16, height 6\' 1"  (1.854 m), weight 96.6 kg (213 lb), SpO2 98 %.Body mass index is 28.1 kg/m.  Repeat BP 137/93  General Appearance: Well Groomed  Eye Contact::  Good  Speech:  Normal Rate409  Volume:  Normal  Mood:  improved, states he feels " a lot better"  Affect:  Appropriate and fuller in range   Thought Process:  Linear and Descriptions of Associations: Intact  Orientation:  Full (Time, Place, and Person)  Thought Content:  no hallucinations , no delusions, not internally preoccupied   Suicidal Thoughts:  No - no suicidal or self  injurious ideations, denies any homicidal or violent ideations   Homicidal Thoughts:  No  Memory:  recent and remote grossly intact   Judgement:  Other:  improving   Insight:  improving   Psychomotor Activity:  Normal  Concentration:  Good  Recall:  Good  Fund of Knowledge:Good  Language: Good  Akathisia:  Negative  Handed:  Right  AIMS (if indicated):     Assets:  Communication Skills Desire for Improvement Resilience  Sleep:  Number of Hours: 4.5  Cognition: WNL  ADL's:  Intact   Mental Status Per Nursing Assessment::   On Admission:  Suicidal ideation indicated by patient  Demographic Factors:  47 year old male, lives with GF , has 5 children, employed   Loss Factors: Relapse /substance abuse   Historical Factors: History of depression, history of cocaine use disorder, history of a prior psychiatric admission  Risk Reduction Factors:   Responsible for children under 30 years of age, Employed, Living with another person, especially a relative and Positive coping skills or problem solving skills  Continued Clinical Symptoms:  At this time patient is improved compared to admission, with improved mood and improved range of affect, no thought disorder, no suicidal or self injurious ideations, no homicidal or violent ideations, no hallucinations at this time and does not appear internally preoccupied, no delusions expressed, future oriented . Behavior on unit calm and in good control. Denies medication side effects. We have reviewed medication side effects. BP has been elevated, without associated symptoms- denies prior history of HTN, has been started on Norvasc, which has been titrated to 10 mgrs QDAY-  It  has been well tolerated  thus far, plans to follow up with his PCP. I have reviewed above with hosptialist consultant who agrees with above management . TSH WNL.   Cognitive Features That Contribute To Risk:  No gross cognitive deficits noted upon discharge. Is alert ,  attentive, and oriented x 3   Suicide Risk:  Mild:  Suicidal ideation of limited frequency, intensity, duration, and specificity.  There are no identifiable plans, no associated intent, mild dysphoria and related symptoms, good self-control (both objective and subjective assessment), few other risk factors, and identifiable protective factors, including available and accessible social support.  Follow-up [facility] Follow up.   Specialty:  Addiction Medicine Why:  Referral faxed: 08/10/17 Contact information: [place] [address] [phone]        [facility] Follow up on 08/17/2017.   Why:  Screening for possible admission on Tuesday, 6/4 at 8:00AM. Please bring photo ID/proof of [facility] residency and medications/prescriptions provided by hospital. Thank you.  Contact information: [address] [place] [address] [phone]        [facility] Follow up on 08/18/2017.   Specialty:  Behavioral Health Why:  Hospital follow-up on Wed 6/5 at 8:00AM. Please bring: photo ID, any proof of household income, and medication list. Thank you.  Contact information: [place] [address] [phone]           Plan Of Care/Follow-up recommendations:  Activity:  as tolerated  Diet:  heart healthy Tests:  NA Other:  See below  Patient is expressing readiness for discharge and is leaving unit in good spirits  Plans to return home  Follow up as above  Plans to follow up at [facility] for ongoing medical management .States he will follow up early next week.  Jenne [place], MD 08/13/2017, 7:44 AM

## 2017-08-16 ENCOUNTER — Other Ambulatory Visit: Payer: Self-pay

## 2017-08-16 ENCOUNTER — Ambulatory Visit: Payer: Self-pay | Admitting: Family Medicine

## 2017-08-24 ENCOUNTER — Ambulatory Visit (INDEPENDENT_AMBULATORY_CARE_PROVIDER_SITE_OTHER): Payer: Self-pay | Admitting: Family Medicine

## 2017-08-24 ENCOUNTER — Encounter: Payer: Self-pay | Admitting: Family Medicine

## 2017-08-24 VITALS — BP 132/80 | HR 84 | Temp 98.0°F | Ht 73.0 in | Wt 223.0 lb

## 2017-08-24 DIAGNOSIS — M25512 Pain in left shoulder: Secondary | ICD-10-CM

## 2017-08-24 DIAGNOSIS — F39 Unspecified mood [affective] disorder: Secondary | ICD-10-CM

## 2017-08-24 DIAGNOSIS — R0989 Other specified symptoms and signs involving the circulatory and respiratory systems: Secondary | ICD-10-CM

## 2017-08-24 DIAGNOSIS — H539 Unspecified visual disturbance: Secondary | ICD-10-CM

## 2017-08-24 DIAGNOSIS — K5900 Constipation, unspecified: Secondary | ICD-10-CM

## 2017-08-24 DIAGNOSIS — Z09 Encounter for follow-up examination after completed treatment for conditions other than malignant neoplasm: Secondary | ICD-10-CM

## 2017-08-24 DIAGNOSIS — G4700 Insomnia, unspecified: Secondary | ICD-10-CM

## 2017-08-24 MED ORDER — NAPROXEN 500 MG PO TABS: 500.0000 mg | ORAL_TABLET | Freq: Two times a day (BID) | ORAL | 1 refills | Status: DC

## 2017-08-24 MED ORDER — ALBUTEROL SULFATE (2.5 MG/3ML) 0.083% IN NEBU: 2.5000 mg | INHALATION_SOLUTION | Freq: Four times a day (QID) | RESPIRATORY_TRACT | 11 refills | Status: DC | PRN

## 2017-08-24 MED ORDER — DOCUSATE SODIUM 100 MG PO CAPS: 100.0000 mg | ORAL_CAPSULE | Freq: Every day | ORAL | 1 refills | Status: DC

## 2017-08-24 MED ORDER — GUAIFENESIN ER 600 MG PO TB12: 600.0000 mg | ORAL_TABLET | Freq: Two times a day (BID) | ORAL | 1 refills | Status: DC
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
## 2017-08-27 ENCOUNTER — Telehealth: Payer: Self-pay

## 2017-08-27 NOTE — Telephone Encounter (Signed)
-----   Message from Azzie Glatter, [place] sent at 08/26/2017 11:54 PM EDT ----- Regarding: "Refills" Morey Hummingbird,   Please let patient know that we called in all of his requested refills except:  Trazodone, Synthroid, and Cymbalta....which were previously called in on 08/13/2017.  Natalie.

## 2017-08-27 NOTE — Telephone Encounter (Signed)
Patient notified

## 2017-08-27 NOTE — Progress Notes (Addendum)
Patient had initial Psychiatric Assessment with [facility] on 08/09/2017 with Ricky Ala, NP.

## 2017-09-02 ENCOUNTER — Encounter: Payer: Self-pay | Admitting: Gastroenterology

## 2017-09-07 ENCOUNTER — Telehealth: Payer: Self-pay | Admitting: Family Medicine

## 2017-09-07 DIAGNOSIS — E039 Hypothyroidism, unspecified: Secondary | ICD-10-CM

## 2017-09-07 NOTE — Telephone Encounter (Signed)
Pt called and states that medication refill of levothyroxine at pharmacy was filled with a 200 mg dose instead of 150 mg dose discussed during last visit; requests a call back from provider

## 2017-09-08 MED ORDER — LEVOTHYROXINE SODIUM 150 MCG PO TABS: 150.0000 ug | ORAL_TABLET | Freq: Every day | ORAL | 1 refills | Status: DC

## 2017-09-08 NOTE — Telephone Encounter (Signed)
Synthroid 138mcg was sent to pharmacy

## 2017-09-29 ENCOUNTER — Ambulatory Visit: Payer: Self-pay

## 2017-10-05 ENCOUNTER — Ambulatory Visit: Payer: No Typology Code available for payment source | Attending: Family Medicine

## 2017-10-08 ENCOUNTER — Telehealth: Payer: Self-pay | Admitting: Family Medicine

## 2017-10-08 NOTE — Telephone Encounter (Signed)
Pt called to inquire about date and time of appt. With [facility]; informed pt of scheduled appt date and time; pt verbalizes understanding

## 2017-10-25 ENCOUNTER — Encounter: Payer: Self-pay | Admitting: Family Medicine

## 2017-10-25 ENCOUNTER — Ambulatory Visit (INDEPENDENT_AMBULATORY_CARE_PROVIDER_SITE_OTHER): Payer: Self-pay | Admitting: Family Medicine

## 2017-10-25 VITALS — BP 124/88 | HR 78 | Temp 98.0°F | Ht 73.0 in | Wt 230.0 lb

## 2017-10-25 DIAGNOSIS — M542 Cervicalgia: Secondary | ICD-10-CM

## 2017-10-25 DIAGNOSIS — Z09 Encounter for follow-up examination after completed treatment for conditions other than malignant neoplasm: Secondary | ICD-10-CM

## 2017-10-25 DIAGNOSIS — N390 Urinary tract infection, site not specified: Secondary | ICD-10-CM

## 2017-10-25 DIAGNOSIS — I10 Essential (primary) hypertension: Secondary | ICD-10-CM

## 2017-10-25 DIAGNOSIS — R0602 Shortness of breath: Secondary | ICD-10-CM

## 2017-10-25 DIAGNOSIS — J45909 Unspecified asthma, uncomplicated: Secondary | ICD-10-CM

## 2017-10-25 DIAGNOSIS — R0989 Other specified symptoms and signs involving the circulatory and respiratory systems: Secondary | ICD-10-CM

## 2017-10-25 DIAGNOSIS — R8290 Unspecified abnormal findings in urine: Secondary | ICD-10-CM

## 2017-10-25 LAB — POCT URINALYSIS DIP (MANUAL ENTRY)
Bilirubin, UA: NEGATIVE
Blood, UA: NEGATIVE
Glucose, UA: NEGATIVE mg/dL
Ketones, POC UA: NEGATIVE mg/dL
Nitrite, UA: NEGATIVE
Protein Ur, POC: NEGATIVE mg/dL
Spec Grav, UA: 1.02 (ref 1.010–1.025)
Urobilinogen, UA: 0.2 E.U./dL
pH, UA: 7 (ref 5.0–8.0)

## 2017-10-25 MED ORDER — AMLODIPINE BESYLATE 10 MG PO TABS: 10.0000 mg | ORAL_TABLET | Freq: Every day | ORAL | 2 refills | Status: DC

## 2017-10-25 MED ORDER — ALBUTEROL SULFATE HFA 108 (90 BASE) MCG/ACT IN AERS: 1.0000 | INHALATION_SPRAY | Freq: Four times a day (QID) | RESPIRATORY_TRACT | 11 refills | Status: DC | PRN

## 2017-10-25 MED ORDER — SULFAMETHOXAZOLE-TRIMETHOPRIM 800-160 MG PO TABS: 1.0000 | ORAL_TABLET | Freq: Two times a day (BID) | ORAL | 0 refills | Status: DC

## 2017-10-25 MED ORDER — ALBUTEROL SULFATE (2.5 MG/3ML) 0.083% IN NEBU: 2.5000 mg | INHALATION_SOLUTION | Freq: Four times a day (QID) | RESPIRATORY_TRACT | 11 refills | Status: DC | PRN

## 2017-10-25 MED ORDER — ALBUTEROL SULFATE HFA 108 (90 BASE) MCG/ACT IN AERS: 1.0000 | INHALATION_SPRAY | Freq: Four times a day (QID) | RESPIRATORY_TRACT | 0 refills | Status: DC | PRN

## 2017-10-25 MED ORDER — CYCLOBENZAPRINE HCL 10 MG PO TABS: 10.0000 mg | ORAL_TABLET | Freq: Three times a day (TID) | ORAL | 2 refills | Status: DC | PRN

## 2017-10-25 MED FILL — CYCLOBENZAPRINE 10 MG TAB: 10 | 10 days supply | Qty: 30 | Fill #0

## 2017-10-25 MED FILL — SULFAMETHOXAZOLE-TMP DS TAB: 800-160 | 7 days supply | Qty: 14 | Fill #0

## 2017-10-25 MED FILL — ALBUTEROL SUL 2.5 MG/3 ML S: (2.5 MG/3ML | 12 days supply | Qty: 150 | Fill #0

## 2017-10-25 MED FILL — !VENTOLIN HFA INHALER: 108 (90 BAS | 25 days supply | Qty: 18 | Fill #0

## 2017-10-25 MED FILL — AMLODIPINE BESYLATE 10 MG T: 10 | 30 days supply | Qty: 30 | Fill #0

## 2017-10-25 NOTE — Patient Instructions (Addendum)
Cyclobenzaprine tablets What is this medicine? CYCLOBENZAPRINE (sye kloe BEN za preen) is a muscle relaxer. It is used to treat muscle pain, spasms, and stiffness. This medicine may be used for other purposes; ask your health care provider or pharmacist if you have questions. COMMON BRAND NAME(S): Fexmid, Flexeril What should I tell my health care provider before I take this medicine? They need to know if you have any of these conditions: -heart disease, irregular heartbeat, or previous heart attack -liver disease -thyroid problem -an unusual or allergic reaction to cyclobenzaprine, tricyclic antidepressants, lactose, other medicines, foods, dyes, or preservatives -pregnant or trying to get pregnant -breast-feeding How should I use this medicine? Take this medicine by mouth with a glass of water. Follow the directions on the prescription label. If this medicine upsets your stomach, take it with food or milk. Take your medicine at regular intervals. Do not take it more often than directed. Talk to your pediatrician regarding the use of this medicine in children. Special care may be needed. Overdosage: If you think you have taken too much of this medicine contact a poison control center or emergency room at once. NOTE: This medicine is only for you. Do not share this medicine with others. What if I miss a dose? If you miss a dose, take it as soon as you can. If it is almost time for your next dose, take only that dose. Do not take double or extra doses. What may interact with this medicine? Do not take this medicine with any of the following medications: -certain medicines for fungal infections like fluconazole, itraconazole, ketoconazole, posaconazole, voriconazole -cisapride -dofetilide -dronedarone -halofantrine -levomethadyl -MAOIs like Carbex, Eldepryl, Marplan, Nardil, and Parnate -narcotic medicines for cough -pimozide -thioridazine -ziprasidone This medicine may also interact  with the following medications: -alcohol -antihistamines for allergy, cough and cold -certain medicines for anxiety or sleep -certain medicines for cancer -certain medicines for depression like amitriptyline, fluoxetine, sertraline -certain medicines for infection like alfuzosin, chloroquine, clarithromycin, levofloxacin, mefloquine, pentamidine, troleandomycin -certain medicines for irregular heart beat -certain medicines for seizures like phenobarbital, primidone -contrast dyes -general anesthetics like halothane, isoflurane, methoxyflurane, propofol -local anesthetics like lidocaine, pramoxine, tetracaine -medicines that relax muscles for surgery -narcotic medicines for pain -other medicines that prolong the QT interval (cause an abnormal heart rhythm) -phenothiazines like chlorpromazine, mesoridazine, prochlorperazine This list may not describe all possible interactions. Give your health care provider a list of all the medicines, herbs, non-prescription drugs, or dietary supplements you use. Also tell them if you smoke, drink alcohol, or use illegal drugs. Some items may interact with your medicine. What should I watch for while using this medicine? Tell your doctor or health care professional if your symptoms do not start to get better or if they get worse. You may get drowsy or dizzy. Do not drive, use machinery, or do anything that needs mental alertness until you know how this medicine affects you. Do not stand or sit up quickly, especially if you are an older patient. This reduces the risk of dizzy or fainting spells. Alcohol may interfere with the effect of this medicine. Avoid alcoholic drinks. If you are taking another medicine that also causes drowsiness, you may have more side effects. Give your health care provider a list of all medicines you use. Your doctor will tell you how much medicine to take. Do not take more medicine than directed. Call emergency for help if you have  problems breathing or unusual sleepiness. Your mouth may get dry. Chewing   sugarless gum or sucking hard candy, and drinking plenty of water may help. Contact your doctor if the problem does not go away or is severe. What side effects may I notice from receiving this medicine? Side effects that you should report to your doctor or health care professional as soon as possible: -allergic reactions like skin rash, itching or hives, swelling of the face, lips, or tongue -breathing problems -chest pain -fast, irregular heartbeat -hallucinations -seizures -unusually weak or tired Side effects that usually do not require medical attention (report to your doctor or health care professional if they continue or are bothersome): -headache -nausea, vomiting This list may not describe all possible side effects. Call your doctor for medical advice about side effects. You may report side effects to FDA at 1-800-FDA-1088. Where should I keep my medicine? Keep out of the reach of children. Store at room temperature between 15 and 30 degrees C (59 and 86 degrees F). Keep container tightly closed. Throw away any unused medicine after the expiration date. NOTE: This sheet is a summary. It may not cover all possible information. If you have questions about this medicine, talk to your doctor, pharmacist, or health care provider.  2018 Elsevier/Gold Standard (2014-12-11 12:05:46) Sulfamethoxazole; Trimethoprim, SMX-TMP oral suspension What is this medicine? SULFAMETHOXAZOLE; TRIMETHOPRIM or SMX-TMP (suhl fuh meth OK suh zohl; trye METH oh prim) is a combination of a sulfonamide antibiotic and a second antibiotic, trimethoprim. It is used to treat or prevent certain kinds of bacterial infections.It will not work for colds, flu, or other viral infections. This medicine may be used for other purposes; ask your health care provider or pharmacist if you have questions. COMMON BRAND NAME(S): Septra, Sulfatrim, Sulfatrim  Pediatric, Sultrex Pediatric What should I tell my health care provider before I take this medicine? They need to know if you have any of these conditions: -anemia -asthma -being treated with anticonvulsants -if you frequently drink alcohol containing drinks -kidney disease -liver disease -low level of folic acid or [id number] dehydrogenase -poor nutrition or malabsorption -porphyria -severe allergies -thyroid disorder -an unusual or allergic reaction to sulfamethoxazole, trimethoprim, sulfa drugs, other medicines, foods, dyes, or preservatives -pregnant or trying to get pregnant -breast-feeding How should I use this medicine? Take this suspension by mouth. Follow the directions on the prescription label. Shake the bottle well before taking. Use a specially marked spoon or container to measure your medicine. Ask your pharmacist if you do not have one. Household spoons are not accurate. Take your doses at regular intervals. Do not take more medicine than directed. Talk to your pediatrician regarding the use of this medicine in children. Special care may be needed. While this drug may be prescribed for children as young as 44 months of age for selected conditions, precautions do apply. Overdosage: If you think you have taken too much of this medicine contact a poison control center or emergency room at once. NOTE: This medicine is only for you. Do not share this medicine with others. What if I miss a dose? If you miss a dose, take it as soon as you can. If it is almost time for your next dose, take only that dose. Do not take double or extra doses. What may interact with this medicine? Do not take this medicine with any of the following medications -aminobenzoate potassium -dofetilide -metronidazole This medicine may also interact with the following medications -ACE inhibitors like benazepril, enalapril, lisinopril, and ramipril -birth control  pills -cyclosporine -digoxin -diuretics -indomethacin -medicines for diabetes -  methenamine -methotrexate -phenytoin -potassium supplements -pyrimethamine -sulfinpyrazone -tricyclic antidepressants -warfarin This list may not describe all possible interactions. Give your health care provider a list of all the medicines, herbs, non-prescription drugs, or dietary supplements you use. Also tell them if you smoke, drink alcohol, or use illegal drugs. Some items may interact with your medicine. What should I watch for while using this medicine? Tell your doctor or health care professional if your symptoms do not improve. Drink several glasses of water a day to reduce the risk of kidney problems. Do not treat diarrhea with over the counter products. Contact your doctor if you have diarrhea that lasts more than 2 days or if it is severe and watery. This medicine can make you more sensitive to the sun. Keep out of the sun. If you cannot avoid being in the sun, wear protective clothing and use a sunscreen. Do not use sun lamps or tanning beds/booths. What side effects may I notice from receiving this medicine? Side effects that you should report to your doctor or health care professional as soon as possible: -allergic reactions like skin rash or hives, swelling of the face, lips, or tongue -breathing problems -fever or chills, sore throat -irregular heartbeat, chest pain -joint or muscle pain -pain or difficulty passing urine -red pinpoint spots on skin -redness, blistering, peeling or loosening of the skin, including inside the mouth -unusual bleeding or bruising -unusual weakness or tiredness -yellowing of the eyes or skin Side effects that usually do not require medical attention (report to your doctor or health care professional if they continue or are bothersome): -diarrhea -dizziness -headache -loss of appetite -nausea, vomiting -nervousness This list may not describe all possible  side effects. Call your doctor for medical advice about side effects. You may report side effects to FDA at 1-800-FDA-1088. Where should I keep my medicine? Keep out of the reach of children. Store at room temperature between 15 and 25 degrees C (59 and 77 degrees F). Protect from light and moisture. Throw away any unused medicine after the expiration date. NOTE: This sheet is a summary. It may not cover all possible information. If you have questions about this medicine, talk to your doctor, pharmacist, or health care provider.  2018 Elsevier/Gold Standard (2012-10-07 14:37:40)

## 2017-10-25 NOTE — Addendum Note (Signed)
Addended by: Azzie Glatter on: 10/25/2017 04:17 PM   Modules accepted: Orders
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
## 2017-10-27 LAB — URINE CULTURE

## 2017-11-02 ENCOUNTER — Ambulatory Visit: Payer: Self-pay | Admitting: Gastroenterology

## 2017-11-05 ENCOUNTER — Ambulatory Visit (INDEPENDENT_AMBULATORY_CARE_PROVIDER_SITE_OTHER): Payer: Self-pay | Admitting: Gastroenterology

## 2017-11-05 ENCOUNTER — Encounter (INDEPENDENT_AMBULATORY_CARE_PROVIDER_SITE_OTHER): Payer: Self-pay

## 2017-11-05 ENCOUNTER — Encounter: Payer: Self-pay | Admitting: Gastroenterology

## 2017-11-05 VITALS — BP 108/68 | HR 64 | Ht 74.0 in | Wt 225.0 lb

## 2017-11-05 DIAGNOSIS — K5900 Constipation, unspecified: Secondary | ICD-10-CM

## 2017-11-05 DIAGNOSIS — Z1211 Encounter for screening for malignant neoplasm of colon: Secondary | ICD-10-CM

## 2017-11-05 MED ORDER — PEG 3350-KCL-NA BICARB-NACL 420 G PO SOLR: 4000.0000 mL | ORAL | 0 refills | Status: DC

## 2017-11-05 MED FILL — PEG-3350 AND ELECTROLYTES S: 236 | 1 days supply | Qty: 4000 | Fill #0

## 2017-11-05 NOTE — Patient Instructions (Addendum)
You will be set up for a colonoscopy for colon cancer screening. For you mild constipation, please start taking citrucel (orange flavored) powder fiber supplement.  This may cause some bloating at first but that usually goes away. Begin with a small spoonful and work your way up to a large, heaping spoonful daily over a week.  Normal BMI (Body Mass Index- based on height and weight) is between 19 and 25. Your BMI today is Body mass index is 28.89 kg/m. [name] Please consider follow up  regarding your BMI with your Primary Care Provider.
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
## 2017-11-16 ENCOUNTER — Other Ambulatory Visit: Payer: Self-pay | Admitting: Family Medicine

## 2017-11-16 DIAGNOSIS — J45909 Unspecified asthma, uncomplicated: Secondary | ICD-10-CM

## 2017-11-18 MED FILL — AMLODIPINE BESYLATE 10 MG T: 10 | 30 days supply | Qty: 30 | Fill #1

## 2017-11-23 ENCOUNTER — Encounter (HOSPITAL_COMMUNITY): Payer: Self-pay | Admitting: Emergency Medicine

## 2017-11-23 ENCOUNTER — Emergency Department (HOSPITAL_COMMUNITY)
Admission: EM | Admit: 2017-11-23 | Discharge: 2017-11-23 | Disposition: A | Discharge status: Home / Self Care | Payer: Self-pay | Attending: Emergency Medicine | Admitting: Emergency Medicine

## 2017-11-23 DIAGNOSIS — F17210 Nicotine dependence, cigarettes, uncomplicated: Secondary | ICD-10-CM | POA: Insufficient documentation

## 2017-11-23 DIAGNOSIS — J45909 Unspecified asthma, uncomplicated: Secondary | ICD-10-CM | POA: Insufficient documentation

## 2017-11-23 DIAGNOSIS — Z79899 Other long term (current) drug therapy: Secondary | ICD-10-CM | POA: Insufficient documentation

## 2017-11-23 DIAGNOSIS — R319 Hematuria, unspecified: Secondary | ICD-10-CM | POA: Insufficient documentation

## 2017-11-23 DIAGNOSIS — Z85850 Personal history of malignant neoplasm of thyroid: Secondary | ICD-10-CM | POA: Insufficient documentation

## 2017-11-23 DIAGNOSIS — E038 Other specified hypothyroidism: Secondary | ICD-10-CM | POA: Insufficient documentation

## 2017-11-23 LAB — COMPREHENSIVE METABOLIC PANEL
ALT: 26 U/L (ref 0–44)
AST: 24 U/L (ref 15–41)
Albumin: 4.4 g/dL (ref 3.5–5.0)
Alkaline Phosphatase: 48 U/L (ref 38–126)
Anion gap: 10 (ref 5–15)
BUN: 15 mg/dL (ref 6–20)
CO2: 28 mmol/L (ref 22–32)
Calcium: 9 mg/dL (ref 8.9–10.3)
Chloride: 106 mmol/L (ref 98–111)
Creatinine, Ser: 1.16 mg/dL (ref 0.61–1.24)
GFR calc Af Amer: >60 mL/min (ref >60)
GFR calc non Af Amer: >60 mL/min (ref >60)
Glucose, Bld: 92 mg/dL (ref 70–99)
Potassium: 3.8 mmol/L (ref 3.5–5.1)
Sodium: 144 mmol/L (ref 135–145)
Total Bilirubin: 0.8 mg/dL (ref 0.3–1.2)
Total Protein: 7 g/dL (ref 6.5–8.1)

## 2017-11-23 LAB — CBC WITH DIFFERENTIAL/PLATELET
Basophils Absolute: 0.1 K/uL (ref 0.0–0.1)
Basophils Relative: 1 %
Eosinophils Absolute: 0.2 K/uL (ref 0.0–0.7)
Eosinophils Relative: 3 %
HCT: 39.8 % (ref 39.0–52.0)
Hemoglobin: 13.9 g/dL (ref 13.0–17.0)
Lymphocytes Relative: 38 %
Lymphs Abs: 2.5 K/uL (ref 0.7–4.0)
MCH: 30.2 pg (ref 26.0–34.0)
MCHC: 34.9 g/dL (ref 30.0–36.0)
MCV: 86.5 fL (ref 78.0–100.0)
Monocytes Absolute: 0.4 K/uL (ref 0.1–1.0)
Monocytes Relative: 6 %
Neutro Abs: 3.5 K/uL (ref 1.7–7.7)
Neutrophils Relative %: 52 %
Platelets: 353 K/uL (ref 150–400)
RBC: 4.6 MIL/uL (ref 4.22–5.81)
RDW: 13.3 % (ref 11.5–15.5)
WBC: 6.6 K/uL (ref 4.0–10.5)

## 2017-11-23 LAB — URINALYSIS, ROUTINE W REFLEX MICROSCOPIC
Bilirubin Urine: NEGATIVE
Glucose, UA: NEGATIVE mg/dL
Hgb urine dipstick: NEGATIVE
Ketones, ur: NEGATIVE mg/dL
Leukocytes, UA: NEGATIVE
Nitrite: NEGATIVE
Protein, ur: NEGATIVE mg/dL
Specific Gravity, Urine: 1.018 (ref 1.005–1.030)
pH: 6 (ref 5.0–8.0)

## 2017-11-23 NOTE — ED Notes (Signed)
Patient eloped chris PA made aware.

## 2017-11-23 NOTE — ED Triage Notes (Signed)
Pt c/o abd pains for couple weeks and noticed blood in urine starting last night. When asked if blood bright red pt responds "something like that".
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
## 2017-11-29 ENCOUNTER — Telehealth: Payer: Self-pay | Admitting: Gastroenterology

## 2017-11-29 MED ORDER — PEG 3350-KCL-NA BICARB-NACL 420 G PO SOLR: 4000.0000 mL | ORAL | 0 refills | Status: DC

## 2017-11-29 NOTE — Telephone Encounter (Signed)
Prescription resent pt aware  

## 2017-12-07 ENCOUNTER — Emergency Department (HOSPITAL_COMMUNITY): Payer: Self-pay

## 2017-12-07 ENCOUNTER — Other Ambulatory Visit: Payer: Self-pay

## 2017-12-07 ENCOUNTER — Emergency Department (HOSPITAL_COMMUNITY)
Admission: EM | Admit: 2017-12-07 | Discharge: 2017-12-07 | Disposition: A | Discharge status: Home / Self Care | Payer: Self-pay | Attending: Emergency Medicine | Admitting: Emergency Medicine

## 2017-12-07 ENCOUNTER — Encounter (HOSPITAL_COMMUNITY): Payer: Self-pay

## 2017-12-07 DIAGNOSIS — R300 Dysuria: Secondary | ICD-10-CM | POA: Insufficient documentation

## 2017-12-07 DIAGNOSIS — F17210 Nicotine dependence, cigarettes, uncomplicated: Secondary | ICD-10-CM | POA: Insufficient documentation

## 2017-12-07 DIAGNOSIS — E079 Disorder of thyroid, unspecified: Secondary | ICD-10-CM | POA: Insufficient documentation

## 2017-12-07 DIAGNOSIS — J45909 Unspecified asthma, uncomplicated: Secondary | ICD-10-CM | POA: Insufficient documentation

## 2017-12-07 DIAGNOSIS — R197 Diarrhea, unspecified: Secondary | ICD-10-CM | POA: Insufficient documentation

## 2017-12-07 DIAGNOSIS — R1030 Lower abdominal pain, unspecified: Secondary | ICD-10-CM | POA: Insufficient documentation

## 2017-12-07 DIAGNOSIS — Z79899 Other long term (current) drug therapy: Secondary | ICD-10-CM | POA: Insufficient documentation

## 2017-12-07 LAB — COMPREHENSIVE METABOLIC PANEL
ALT: 22 U/L (ref 0–44)
AST: 25 U/L (ref 15–41)
Albumin: 4 g/dL (ref 3.5–5.0)
Alkaline Phosphatase: 54 U/L (ref 38–126)
Anion gap: 13 (ref 5–15)
BUN: 12 mg/dL (ref 6–20)
CO2: 25 mmol/L (ref 22–32)
CREATININE: 1.44 mg/dL — AB (ref 0.61–1.24)
Calcium: 9.1 mg/dL (ref 8.9–10.3)
Chloride: 102 mmol/L (ref 98–111)
GFR calc Af Amer: >60 mL/min (ref >60)
GFR, EST NON AFRICAN AMERICAN: 56 mL/min — AB (ref >60)
Glucose, Bld: 121 mg/dL — ABNORMAL HIGH (ref 70–99)
Potassium: 3.7 mmol/L (ref 3.5–5.1)
Sodium: 140 mmol/L (ref 135–145)
TOTAL PROTEIN: 7 g/dL (ref 6.5–8.1)
Total Bilirubin: 0.8 mg/dL (ref 0.3–1.2)

## 2017-12-07 LAB — CBC
HEMATOCRIT: 42.9 % (ref 39.0–52.0)
HEMOGLOBIN: 14.2 g/dL (ref 13.0–17.0)
MCH: 29.4 pg (ref 26.0–34.0)
MCHC: 33.1 g/dL (ref 30.0–36.0)
MCV: 88.8 fL (ref 78.0–100.0)
Platelets: 304 10*3/uL (ref 150–400)
RBC: 4.83 MIL/uL (ref 4.22–5.81)
RDW: 13.2 % (ref 11.5–15.5)
WBC: 6.9 10*3/uL (ref 4.0–10.5)

## 2017-12-07 LAB — URINALYSIS, ROUTINE W REFLEX MICROSCOPIC
Bilirubin Urine: NEGATIVE
Glucose, UA: NEGATIVE mg/dL
HGB URINE DIPSTICK: NEGATIVE
Ketones, ur: NEGATIVE mg/dL
Leukocytes, UA: NEGATIVE
NITRITE: NEGATIVE
PH: 6 (ref 5.0–8.0)
Protein, ur: NEGATIVE mg/dL
SPECIFIC GRAVITY, URINE: 1.017 (ref 1.005–1.030)

## 2017-12-07 LAB — LIPASE, BLOOD: Lipase: 37 U/L (ref 11–51)

## 2017-12-07 MED ORDER — DICYCLOMINE HCL 10 MG PO CAPS
20.0000 mg | ORAL_CAPSULE | Freq: Once | ORAL | Status: AC
  Administered 2017-12-07: 20 mg via ORAL
  Filled 2017-12-07: qty 2

## 2017-12-07 MED ORDER — CEFTRIAXONE SODIUM 250 MG IJ SOLR
250.0000 mg | Freq: Once | INTRAMUSCULAR | Status: AC
  Administered 2017-12-07: 250 mg via INTRAMUSCULAR
  Filled 2017-12-07: qty 250

## 2017-12-07 MED ORDER — IOHEXOL 300 MG/ML  SOLN
100.0000 mL | Freq: Once | INTRAMUSCULAR | Status: AC
  Administered 2017-12-07: 100 mL via INTRAVENOUS

## 2017-12-07 MED ORDER — SODIUM CHLORIDE 0.9 % IV BOLUS
1000.0000 mL | Freq: Once | INTRAVENOUS | Status: AC
  Administered 2017-12-07: 1000 mL via INTRAVENOUS

## 2017-12-07 MED ORDER — AZITHROMYCIN 250 MG PO TABS
1000.0000 mg | ORAL_TABLET | Freq: Once | ORAL | Status: AC
  Administered 2017-12-07: 1000 mg via ORAL
  Filled 2017-12-07: qty 4

## 2017-12-07 MED ORDER — DICYCLOMINE HCL 20 MG PO TABS: 20.0000 mg | ORAL_TABLET | Freq: Two times a day (BID) | ORAL | 0 refills | Status: DC

## 2017-12-07 MED ORDER — ONDANSETRON HCL 4 MG/2ML IJ SOLN
4.0000 mg | Freq: Once | INTRAMUSCULAR | Status: AC
  Administered 2017-12-07: 4 mg via INTRAVENOUS
  Filled 2017-12-07: qty 2

## 2017-12-07 MED ORDER — LIDOCAINE HCL (PF) 1 % IJ SOLN
INTRAMUSCULAR | Status: AC
  Administered 2017-12-07: 2.1 mL
  Filled 2017-12-07: qty 5

## 2017-12-07 MED FILL — DICYCLOMINE HCL 20 MG TABS: 20 | 10 days supply | Qty: 20 | Fill #0
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
## 2017-12-07 NOTE — ED Triage Notes (Signed)
Pt states he has been having abdominal cramping as well as some discomfort in his genitals X1 weeks. He also reports burning sensation when urinating this morning.

## 2017-12-07 NOTE — ED Notes (Signed)
Patient verbalizes understanding of discharge instructions. Opportunity for questioning and answers were provided. Armband removed by staff, pt discharged from ED.  

## 2017-12-07 NOTE — Discharge Instructions (Addendum)
Evaluation today is overall reassuring, labs and CT look good.  You may use Bentyl to help manage abdominal cramping and I would also like free to begin taking a probiotic to help with diarrhea, brand such as align or Culturelle work well these can be purchased over-the-counter.  You were treated prophylactically today for gonorrhea and chlamydia I think this is likely what is causing your urinary symptoms, you have STD testing pending and will be contacted in 2 to 3 days with any positive results, so you will need to contact any partners and let them know.  Return to the emergency department for worsening abdominal pain, vomiting, fevers, blood in your stools, worsening urinary symptoms or any other new or concerning symptoms.

## 2017-12-08 LAB — HIV ANTIBODY (ROUTINE TESTING W REFLEX): HIV SCREEN 4TH GENERATION: NONREACTIVE

## 2017-12-08 LAB — RPR: RPR: NONREACTIVE

## 2017-12-08 LAB — GC/CHLAMYDIA PROBE AMP (~~LOC~~) NOT AT ARMC
CHLAMYDIA, DNA PROBE: NEGATIVE
Neisseria Gonorrhea: NEGATIVE

## 2017-12-14 MED FILL — PEG-3350 SOLUTION: 420 | 1 days supply | Qty: 4000 | Fill #0

## 2017-12-17 ENCOUNTER — Ambulatory Visit (AMBULATORY_SURGERY_CENTER): Payer: Self-pay | Admitting: Gastroenterology

## 2017-12-17 ENCOUNTER — Encounter: Payer: Self-pay | Admitting: Gastroenterology

## 2017-12-17 VITALS — BP 101/65 | HR 68 | Temp 99.8°F | Resp 15 | Ht 74.0 in | Wt 225.0 lb

## 2017-12-17 DIAGNOSIS — D128 Benign neoplasm of rectum: Secondary | ICD-10-CM

## 2017-12-17 DIAGNOSIS — D124 Benign neoplasm of descending colon: Secondary | ICD-10-CM

## 2017-12-17 DIAGNOSIS — Z1211 Encounter for screening for malignant neoplasm of colon: Secondary | ICD-10-CM

## 2017-12-17 DIAGNOSIS — K621 Rectal polyp: Secondary | ICD-10-CM

## 2017-12-17 DIAGNOSIS — D122 Benign neoplasm of ascending colon: Secondary | ICD-10-CM

## 2017-12-17 MED ORDER — SODIUM CHLORIDE 0.9 % IV SOLN: 500.0000 mL | Freq: Once | INTRAVENOUS | Status: DC

## 2017-12-17 NOTE — Op Note (Signed)
[facility] Patient Name: Nicholas Mccarty Procedure Date: 12/17/2017 3:29 PM MRN: [id number] Endoscopist: Milus Banister , MD Age: 47 Referring MD:  Date of Birth: Sep 20, 1970 Gender: Male Account #: [id number] Procedure:                Colonoscopy Indications:              Screening for colorectal malignant neoplasm Medicines:                Monitored Anesthesia Care Procedure:                Pre-Anesthesia Assessment:                           - Prior to the procedure, a History and Physical                            was performed, and patient medications and                            allergies were reviewed. The patient's tolerance of                            previous anesthesia was also reviewed. The risks                            and benefits of the procedure and the sedation                            options and risks were discussed with the patient.                            All questions were answered, and informed consent                            was obtained. Prior Anticoagulants: The patient has                            taken no previous anticoagulant or antiplatelet                            agents. ASA Grade Assessment: II - A patient with                            mild systemic disease. After reviewing the risks                            and benefits, the patient was deemed in                            satisfactory condition to undergo the procedure.                           After obtaining informed consent, the colonoscope  was passed under direct vision. Throughout the                            procedure, the patient's blood pressure, pulse, and                            oxygen saturations were monitored continuously. The                            Model CF-HQ190L [id number]) scope was introduced                            through the anus and advanced to the the cecum,                            identified by  appendiceal orifice and ileocecal                            valve. The colonoscopy was performed without                            difficulty. The patient tolerated the procedure                            well. The quality of the bowel preparation was                            good. The ileocecal valve, appendiceal orifice, and                            rectum were photographed. Scope In: 3:41:54 PM Scope Out: 3:53:48 PM Scope Withdrawal Time: 0 hours 10 minutes 3 seconds  Total Procedure Duration: 0 hours 11 minutes 54 seconds  Findings:                 Three sessile polyps were found in the rectum,                            descending colon and ascending colon. The polyps                            were 2 to 6 mm in size. These polyps were removed                            with a cold snare. Resection and retrieval were                            complete.                           The exam was otherwise without abnormality on                            direct and retroflexion views. Complications:  No immediate complications. Estimated blood loss:                            None. Estimated Blood Loss:     Estimated blood loss: none. Impression:               - Three 2 to 6 mm polyps in the rectum, in the                            descending colon and in the ascending colon,                            removed with a cold snare. Resected and retrieved.                           - The examination was otherwise normal on direct                            and retroflexion views. Recommendation:           - Patient has a contact number available for                            emergencies. The signs and symptoms of potential                            delayed complications were discussed with the                            patient. Return to normal activities tomorrow.                            Written discharge instructions were provided to the                             patient.                           - Resume previous diet.                           - Continue present medications.                           You will receive a letter within 2-3 weeks with the                            pathology results and my final recommendations.                           If the polyp(s) is proven to be 'pre-cancerous' on                            pathology, you will need repeat colonoscopy in 3-5  years. If the polyp(s) is NOT 'precancerous' on                            pathology then you should repeat colon cancer                            screening in 10 years with colonoscopy without need                            for colon cancer screening by any method prior to                            then (including stool testing). Milus Banister, MD 12/17/2017 3:55:54 PM This report has been signed electronically.

## 2017-12-17 NOTE — Patient Instructions (Signed)
YOU HAD AN ENDOSCOPIC PROCEDURE TODAY AT THE [facility]:   Refer to the procedure report that was given to you for any specific questions about what was found during the examination.  If the procedure report does not answer your questions, please call your gastroenterologist to clarify.  If you requested that your care partner not be given the details of your procedure findings, then the procedure report has been included in a sealed envelope for you to review at your convenience later.  YOU SHOULD EXPECT: Some feelings of bloating in the abdomen. Passage of more gas than usual.  Walking can help get rid of the air that was put into your GI tract during the procedure and reduce the bloating. If you had a lower endoscopy (such as a colonoscopy or flexible sigmoidoscopy) you may notice spotting of blood in your stool or on the toilet paper. If you underwent a bowel prep for your procedure, you may not have a normal bowel movement for a few days.  Please Note:  You might notice some irritation and congestion in your nose or some drainage.  This is from the oxygen used during your procedure.  There is no need for concern and it should clear up in a day or so.  SYMPTOMS TO REPORT IMMEDIATELY:   Following lower endoscopy (colonoscopy or flexible sigmoidoscopy):  Excessive amounts of blood in the stool  Significant tenderness or worsening of abdominal pains  Swelling of the abdomen that is new, acute  Fever of 100F or higher  For urgent or emergent issues, a gastroenterologist can be reached at any hour by calling [phone].   DIET:  We do recommend a small meal at first, but then you may proceed to your regular diet.  Drink plenty of fluids but you should avoid alcoholic beverages for 24 hours.  ACTIVITY:  You should plan to take it easy for the rest of today and you should NOT DRIVE or use heavy machinery until tomorrow (because of the sedation medicines used during the test).     FOLLOW UP: Our staff will call the number listed on your records the next business day following your procedure to check on you and address any questions or concerns that you may have regarding the information given to you following your procedure. If we do not reach you, we will leave a message.  However, if you are feeling well and you are not experiencing any problems, there is no need to return our call.  We will assume that you have returned to your regular daily activities without incident.  If any biopsies were taken you will be contacted by phone or by letter within the next 1-3 weeks.  Please call us at [phone] if you have not heard about the biopsies in 3 weeks.    SIGNATURES/CONFIDENTIALITY: You and/or your care partner have signed paperwork which will be entered into your electronic medical record.  These signatures attest to the fact that that the information above on your After Visit Summary has been reviewed and is understood.  Full responsibility of the confidentiality of this discharge information lies with you and/or your care-partner. 

## 2017-12-17 NOTE — Progress Notes (Signed)
Called to room to assist during endoscopic procedure.  Patient ID and intended procedure confirmed with present staff. Received instructions for my participation in the procedure from the performing physician.  

## 2017-12-17 NOTE — Progress Notes (Signed)
Report given to PACU, vss 

## 2017-12-20 ENCOUNTER — Telehealth: Payer: Self-pay

## 2017-12-20 NOTE — Telephone Encounter (Signed)
  Follow up Call-  Call back number 12/17/2017  Post procedure Call Back phone  # [phone]  Permission to leave phone message Yes  Some recent data might be hidden     Left message

## 2017-12-20 NOTE — Telephone Encounter (Signed)
  Follow up Call-  Call back number 12/17/2017  Post procedure Call Back phone  # [phone]  Permission to leave phone message Yes  Some recent data might be hidden     Left Message

## 2017-12-23 ENCOUNTER — Telehealth: Payer: Self-pay

## 2017-12-23 NOTE — Telephone Encounter (Signed)
Left a message that patient has appointment on 10/14 at  2pm and call to reschedule if they can't make it.

## 2017-12-24 ENCOUNTER — Encounter: Payer: Self-pay | Admitting: Gastroenterology

## 2017-12-27 ENCOUNTER — Encounter: Payer: Self-pay | Admitting: Family Medicine

## 2017-12-27 ENCOUNTER — Ambulatory Visit (INDEPENDENT_AMBULATORY_CARE_PROVIDER_SITE_OTHER): Payer: Self-pay | Admitting: Family Medicine

## 2017-12-27 VITALS — BP 124/88 | HR 84 | Temp 98.1°F | Ht 74.0 in | Wt 227.0 lb

## 2017-12-27 DIAGNOSIS — M109 Gout, unspecified: Secondary | ICD-10-CM

## 2017-12-27 DIAGNOSIS — I10 Essential (primary) hypertension: Secondary | ICD-10-CM

## 2017-12-27 DIAGNOSIS — Z09 Encounter for follow-up examination after completed treatment for conditions other than malignant neoplasm: Secondary | ICD-10-CM

## 2017-12-27 DIAGNOSIS — J302 Other seasonal allergic rhinitis: Secondary | ICD-10-CM

## 2017-12-27 LAB — POCT URINALYSIS DIP (MANUAL ENTRY)
Bilirubin, UA: NEGATIVE
Blood, UA: NEGATIVE
Glucose, UA: NEGATIVE mg/dL
Leukocytes, UA: NEGATIVE
Nitrite, UA: NEGATIVE
Protein Ur, POC: NEGATIVE mg/dL
Spec Grav, UA: 1.02 (ref 1.010–1.025)
Urobilinogen, UA: 0.2 E.U./dL
pH, UA: 7 (ref 5.0–8.0)

## 2017-12-27 MED ORDER — AMLODIPINE BESYLATE 10 MG PO TABS: 10.0000 mg | ORAL_TABLET | Freq: Every day | ORAL | 6 refills | Status: DC

## 2017-12-27 MED ORDER — CETIRIZINE HCL 10 MG PO TABS: 10.0000 mg | ORAL_TABLET | Freq: Every day | ORAL | 11 refills | Status: DC

## 2017-12-27 MED ORDER — SALINE SPRAY 0.65 % NA SOLN: 1.0000 | NASAL | 3 refills | Status: DC | PRN

## 2017-12-27 MED ORDER — COLCHICINE 0.6 MG PO TABS: 0.6000 mg | ORAL_TABLET | Freq: Two times a day (BID) | ORAL | 3 refills | Status: DC

## 2017-12-27 MED ORDER — FLUTICASONE PROPIONATE 50 MCG/ACT NA SUSP: 2.0000 | Freq: Every day | NASAL | 6 refills | Status: DC

## 2017-12-27 MED FILL — AMLODIPINE BESYLATE 10 MG T: 10 | 30 days supply | Qty: 30 | Fill #2

## 2017-12-27 MED FILL — !COLCRYS 0.6 MG TABLET: 0.6 MG | 15 days supply | Qty: 30 | Fill #0
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
## 2017-12-27 NOTE — Patient Instructions (Addendum)
Gout Gout is painful swelling that can happen in some of your joints. Gout is a type of arthritis. This condition is caused by having too much uric acid in your body. Uric acid is a chemical that is made when your body breaks down substances called purines. If your body has too much uric acid, sharp crystals can form and build up in your joints. This causes pain and swelling. Gout attacks can happen quickly and be very painful (acute gout). Over time, the attacks can affect more joints and happen more often (chronic gout). Follow these instructions at home: During a Gout Attack  If directed, put ice on the painful area: ? Put ice in a plastic bag. ? Place a towel between your skin and the bag. ? Leave the ice on for 20 minutes, 2-3 times a day.  Rest the joint as much as possible. If the joint is in your leg, you may be given crutches to use.  Raise (elevate) the painful joint above the level of your heart as often as you can.  Drink enough fluids to keep your pee (urine) clear or pale yellow.  Take over-the-counter and prescription medicines only as told by your doctor.  Do not drive or use heavy machinery while taking prescription pain medicine.  Follow instructions from your doctor about what you can or cannot eat and drink.  Return to your normal activities as told by your doctor. Ask your doctor what activities are safe for you. Avoiding Future Gout Attacks  Follow a low-purine diet as told by a specialist (dietitian) or your doctor. Avoid foods and drinks that have a lot of purines, such as: ? Liver. ? Kidney. ? Anchovies. ? Asparagus. ? Herring. ? Mushrooms ? Mussels. ? Beer.  Limit alcohol intake to no more than 1 drink a day for nonpregnant women and 2 drinks a day for men. One drink equals 12 oz of beer, 5 oz of wine, or 1 oz of hard liquor.  Stay at a healthy weight or lose weight if you are overweight. If you want to lose weight, talk with your doctor. It is  important that you do not lose weight too fast.  Start or continue an exercise plan as told by your doctor.  Drink enough fluids to keep your pee clear or pale yellow.  Take over-the-counter and prescription medicines only as told by your doctor.  Keep all follow-up visits as told by your doctor. This is important. Contact a doctor if:  You have another gout attack.  You still have symptoms of a gout attack after10 days of treatment.  You have problems (side effects) because of your medicines.  You have chills or a fever.  You have burning pain when you pee (urinate).  You have pain in your lower back or belly. Get help right away if:  You have very bad pain.  Your pain cannot be controlled.  You cannot pee. This information is not intended to replace advice given to you by your health care provider. Make sure you discuss any questions you have with your health care provider. Document Released: 12/10/2007 Document Revised: 08/08/2015 Document Reviewed: 12/13/2014 Elsevier Interactive Patient Education  2018 [employer]. Cetirizine tablets What is this medicine? CETIRIZINE (se TI ra zeen) is an antihistamine. This medicine is used to treat or prevent symptoms of allergies. It is also used to help reduce itchy skin rash and hives. This medicine may be used for other purposes; ask your health care provider  or pharmacist if you have questions. COMMON BRAND NAME(S): All Day Allergy, Zyrtec, Zyrtec Hives Relief What should I tell my health care provider before I take this medicine? They need to know if you have any of these conditions: -kidney disease -liver disease -an unusual or allergic reaction to cetirizine, hydroxyzine, other medicines, foods, dyes, or preservatives -pregnant or trying to get pregnant -breast-feeding How should I use this medicine? Take this medicine by mouth with a glass of water. Follow the directions on the prescription label. You can take this  medicine with food or on an empty stomach. Take your medicine at regular times. Do not take more often than directed. You may need to take this medicine for several days before your symptoms improve. Talk to your pediatrician regarding the use of this medicine in children. Special care may be needed. While this drug may be prescribed for children as young as 54 years of age for selected conditions, precautions do apply. Overdosage: If you think you have taken too much of this medicine contact a poison control center or emergency room at once. NOTE: This medicine is only for you. Do not share this medicine with others. What if I miss a dose? If you miss a dose, take it as soon as you can. If it is almost time for your next dose, take only that dose. Do not take double or extra doses. What may interact with this medicine? -alcohol -certain medicines for anxiety or sleep -narcotic medicines for pain -other medicines for colds or allergies This list may not describe all possible interactions. Give your health care provider a list of all the medicines, herbs, non-prescription drugs, or dietary supplements you use. Also tell them if you smoke, drink alcohol, or use illegal drugs. Some items may interact with your medicine. What should I watch for while using this medicine? Visit your doctor or health care professional for regular checks on your health. Tell your doctor if your symptoms do not improve. You may get drowsy or dizzy. Do not drive, use machinery, or do anything that needs mental alertness until you know how this medicine affects you. Do not stand or sit up quickly, especially if you are an older patient. This reduces the risk of dizzy or fainting spells. Your mouth may get dry. Chewing sugarless gum or sucking hard candy, and drinking plenty of water may help. Contact your doctor if the problem does not go away or is severe. What side effects may I notice from receiving this medicine? Side  effects that you should report to your doctor or health care professional as soon as possible: -allergic reactions like skin rash, itching or hives, swelling of the face, lips, or tongue -changes in vision or hearing -fast or irregular heartbeat -trouble passing urine or change in the amount of urine Side effects that usually do not require medical attention (report to your doctor or health care professional if they continue or are bothersome): -dizziness -dry mouth -irritability -sore throat -stomach pain -tiredness This list may not describe all possible side effects. Call your doctor for medical advice about side effects. You may report side effects to FDA at 1-800-FDA-1088. Where should I keep my medicine? Keep out of the reach of children. Store at room temperature between 15 and 30 degrees C (59 and 86 degrees F). Throw away any unused medicine after the expiration date. NOTE: This sheet is a summary. It may not cover all possible information. If you have questions about this medicine, talk  to your doctor, pharmacist, or health care provider.  2018 Elsevier/Gold Standard (2014-03-27 13:44:42) Fluticasone nasal spray What is this medicine? FLUTICASONE (floo TIK a sone) is a corticosteroid. This medicine is used to treat the symptoms of allergies like sneezing, itchy red eyes, and itchy, runny, or stuffy nose. This medicine is also used to treat nasal polyps. This medicine may be used for other purposes; ask your health care provider or pharmacist if you have questions. COMMON BRAND NAME(S): Flonase, Flonase Allergy Relief, Flonase Sensimist, Veramyst, XHANCE What should I tell my health care provider before I take this medicine? They need to know if you have any of these conditions: -cataracts -glaucoma -infection, like tuberculosis, herpes, or fungal infection -recent surgery on nose or sinuses -taking a corticosteroid by mouth -an unusual or allergic reaction to fluticasone,  steroids, other medicines, foods, dyes, or preservatives -pregnant or trying to get pregnant -breast-feeding How should I use this medicine? This medicine is for use in the nose. Follow the directions on your product or prescription label. This medicine works best if used at regular intervals. Do not use more often than directed. Make sure that you are using your nasal spray correctly. After 6 months of daily use for allergies, talk to your doctor or health care professional before using it for a longer time. Ask your doctor or health care professional if you have any questions. Talk to your pediatrician regarding the use of this medicine in children. Special care may be needed. Some products have been used for allergies in children as young as 2 years. After 2 months of daily use without a prescription in a child, talk to your pediatrician before using it for a longer time. Use of this medicine for nasal polyps is not approved in children. Overdosage: If you think you have taken too much of this medicine contact a poison control center or emergency room at once. NOTE: This medicine is only for you. Do not share this medicine with others. What if I miss a dose? If you miss a dose, use it as soon as you remember. If it is almost time for your next dose, use only that dose and continue with your regular schedule. Do not use double or extra doses. What may interact with this medicine? -certain antibiotics like clarithromycin and telithromycin -certain medicines for fungal infections like ketoconazole, itraconazole, and voriconazole -conivaptan -nefazodone -some medicines for HIV -vaccines This list may not describe all possible interactions. Give your health care provider a list of all the medicines, herbs, non-prescription drugs, or dietary supplements you use. Also tell them if you smoke, drink alcohol, or use illegal drugs. Some items may interact with your medicine. What should I watch for while  using this medicine? Visit your doctor or health care professional for regular checks on your progress. Some symptoms may improve within 12 hours after starting use. Check with your doctor or health care professional if there is no improvement in your symptoms after 3 weeks of use. This medicine may increase your risk of getting an infection. Tell your doctor or health care professional if you are around anyone with measles or chickenpox, or if you develop sores or blisters that do not heal properly. What side effects may I notice from receiving this medicine? Side effects that you should report to your doctor or health care professional as soon as possible: -allergic reactions like skin rash, itching or hives, swelling of the face, lips, or tongue -changes in vision -crusting or sores  in the nose -nosebleed -signs and symptoms of infection like fever or chills; cough; sore throat -white patches or sores in the mouth or nose Side effects that usually do not require medical attention (report to your doctor or health care professional if they continue or are bothersome): -burning or irritation inside the nose or throat -cough -headache -unusual taste or smell This list may not describe all possible side effects. Call your doctor for medical advice about side effects. You may report side effects to FDA at 1-800-FDA-1088. Where should I keep my medicine? Keep out of the reach of children. Store at room temperature between 15 and 30 degrees C (59 and 86 degrees F). Avoid exposure to extreme heat, cold, or light. Throw away any unused medicine after the expiration date. NOTE: This sheet is a summary. It may not cover all possible information. If you have questions about this medicine, talk to your doctor, pharmacist, or health care provider.  2018 Elsevier/Gold Standard (2015-12-13 14:23:12) Colchicine tablets or capsules What is this medicine? COLCHICINE (KOL chi seen) is for joint pain and  swelling due to attacks of acute gouty arthritis. The medicine is also used to treat familial Mediterranean fever. This medicine may be used for other purposes; ask your health care provider or pharmacist if you have questions. COMMON BRAND NAME(S): Colcrys, MITIGARE What should I tell my health care provider before I take this medicine? They need to know if you have any of these conditions: -anemia -blood disorders like leukemia or lymphoma -heart disease -immune system problems -intestinal disease -kidney disease -liver disease -muscle pain or weakness -take other medicines -stomach problems -an unusual or allergic reaction to colchicine, other medicines, lactose, foods, dyes, or preservatives -pregnant or trying to get pregnant -breast-feeding How should I use this medicine? Take this medicine by mouth with a full glass of water. Follow the directions on the prescription label. You can take it with or without food. If it upsets your stomach, take it with food. Take your medicine at regular intervals. Do not take your medicine more often than directed. A special MedGuide will be given to you by the pharmacist with each prescription and refill. Be sure to read this information carefully each time. Talk to your pediatrician regarding the use of this medicine in children. While this drug may be prescribed for children as young as 51 years old for selected conditions, precautions do apply. Patients over 32 years old may have a stronger reaction and need a smaller dose. Overdosage: If you think you have taken too much of this medicine contact a poison control center or emergency room at once. NOTE: This medicine is only for you. Do not share this medicine with others. What if I miss a dose? If you miss a dose, take it as soon as you can. If it is almost time for your next dose, take only that dose. Do not take double or extra doses. What may interact with this medicine? Do not take this  medicine with any of the following medications: -certain medicines for fungal infections like itraconazole This medicine may also interact with the following medications: -alcohol -certain medicines for cholesterol like atorvastatin -certain medicines for coughs and colds -certain medicines to help you breathe better -cyclosporine -digoxin -epinephrine -grapefruit or grapefruit juice -methenamine -other medicines for fungal infection -sodium bicarbonate -some antibiotics like clarithromycin, erythromycin, and telithromycin -some medicines for an irregular heartbeat or other heart problems -some medicines for cancer, like lapatinib and tamoxifen -some medicines for  HIV This list may not describe all possible interactions. Give your health care provider a list of all the medicines, herbs, non-prescription drugs, or dietary supplements you use. Also tell them if you smoke, drink alcohol, or use illegal drugs. Some items may interact with your medicine. What should I watch for while using this medicine? Visit your doctor or health care professional for regular checks on your progress. You may need periodic blood checks. Alcohol can increase the chance of getting stomach problems and gout attacks. Do not drink alcohol. What side effects may I notice from receiving this medicine? Side effects that you should report to your doctor or health care professional as soon as possible: -allergic reactions like skin rash, itching or hives, swelling of the face, lips, or tongue -fever, chills, or sore throat -muscle tenderness, pain, or weakness -numbness or tingling in hands or feet -unusual bleeding or bruising -unusually weak or tired -vomiting Side effects that usually do not require medical attention (report to your doctor or health care professional if they continue or are bothersome): -diarrhea -hair loss -loss of appetite -stomach pain or nausea This list may not describe all possible  side effects. Call your doctor for medical advice about side effects. You may report side effects to FDA at 1-800-FDA-1088. Where should I keep my medicine? Keep out of the reach of children. Store at room temperature between 15 and 30 degrees C (59 and 86 degrees F). Keep container tightly closed. Protect from light. Throw away any unused medicine after the expiration date. NOTE: This sheet is a summary. It may not cover all possible information. If you have questions about this medicine, talk to your doctor, pharmacist, or health care provider.  2018 Elsevier/Gold Standard (2012-08-29 16:48:38)

## 2018-01-20 ENCOUNTER — Other Ambulatory Visit: Payer: Self-pay

## 2018-01-20 ENCOUNTER — Encounter (HOSPITAL_COMMUNITY): Payer: Self-pay | Admitting: Emergency Medicine

## 2018-01-20 ENCOUNTER — Emergency Department (HOSPITAL_COMMUNITY)
Admission: EM | Admit: 2018-01-20 | Discharge: 2018-01-20 | Disposition: A | Discharge status: Other Acute Inpatient Hospital | Payer: Self-pay | Attending: Emergency Medicine | Admitting: Emergency Medicine

## 2018-01-20 ENCOUNTER — Inpatient Hospital Stay (HOSPITAL_COMMUNITY)
Admission: AD | Admit: 2018-01-20 | Discharge: 2018-01-24 | DRG: 885 | Disposition: A | Discharge status: Home / Self Care | Payer: Federal, State, Local not specified - Other | Source: Intra-hospital | Attending: Psychiatry | Admitting: Psychiatry

## 2018-01-20 DIAGNOSIS — F3181 Bipolar II disorder: Secondary | ICD-10-CM | POA: Diagnosis not present

## 2018-01-20 DIAGNOSIS — I10 Essential (primary) hypertension: Secondary | ICD-10-CM | POA: Insufficient documentation

## 2018-01-20 DIAGNOSIS — F419 Anxiety disorder, unspecified: Secondary | ICD-10-CM | POA: Diagnosis not present

## 2018-01-20 DIAGNOSIS — Z7951 Long term (current) use of inhaled steroids: Secondary | ICD-10-CM

## 2018-01-20 DIAGNOSIS — F17210 Nicotine dependence, cigarettes, uncomplicated: Secondary | ICD-10-CM | POA: Insufficient documentation

## 2018-01-20 DIAGNOSIS — F3164 Bipolar disorder, current episode mixed, severe, with psychotic features: Secondary | ICD-10-CM

## 2018-01-20 DIAGNOSIS — F1910 Other psychoactive substance abuse, uncomplicated: Secondary | ICD-10-CM

## 2018-01-20 DIAGNOSIS — E890 Postprocedural hypothyroidism: Secondary | ICD-10-CM | POA: Diagnosis present

## 2018-01-20 DIAGNOSIS — G4700 Insomnia, unspecified: Secondary | ICD-10-CM | POA: Diagnosis present

## 2018-01-20 DIAGNOSIS — Z79899 Other long term (current) drug therapy: Secondary | ICD-10-CM | POA: Diagnosis not present

## 2018-01-20 DIAGNOSIS — Z85850 Personal history of malignant neoplasm of thyroid: Secondary | ICD-10-CM

## 2018-01-20 DIAGNOSIS — K219 Gastro-esophageal reflux disease without esophagitis: Secondary | ICD-10-CM | POA: Diagnosis present

## 2018-01-20 DIAGNOSIS — R45851 Suicidal ideations: Secondary | ICD-10-CM | POA: Diagnosis present

## 2018-01-20 DIAGNOSIS — E039 Hypothyroidism, unspecified: Secondary | ICD-10-CM | POA: Insufficient documentation

## 2018-01-20 DIAGNOSIS — Z79890 Hormone replacement therapy (postmenopausal): Secondary | ICD-10-CM

## 2018-01-20 DIAGNOSIS — F1410 Cocaine abuse, uncomplicated: Secondary | ICD-10-CM | POA: Diagnosis present

## 2018-01-20 DIAGNOSIS — F251 Schizoaffective disorder, depressive type: Secondary | ICD-10-CM | POA: Insufficient documentation

## 2018-01-20 DIAGNOSIS — J45909 Unspecified asthma, uncomplicated: Secondary | ICD-10-CM | POA: Diagnosis present

## 2018-01-20 DIAGNOSIS — F1420 Cocaine dependence, uncomplicated: Secondary | ICD-10-CM | POA: Insufficient documentation

## 2018-01-20 LAB — URINALYSIS, ROUTINE W REFLEX MICROSCOPIC
BACTERIA UA: NONE SEEN
Bilirubin Urine: NEGATIVE
Glucose, UA: NEGATIVE mg/dL
KETONES UR: 80 mg/dL — AB
Nitrite: NEGATIVE
Protein, ur: NEGATIVE mg/dL
Specific Gravity, Urine: 1.02 (ref 1.005–1.030)
pH: 5 (ref 5.0–8.0)

## 2018-01-20 LAB — CBC
HEMATOCRIT: 41.5 % (ref 39.0–52.0)
HEMOGLOBIN: 14 g/dL (ref 13.0–17.0)
MCH: 30.3 pg (ref 26.0–34.0)
MCHC: 33.7 g/dL (ref 30.0–36.0)
MCV: 89.8 fL (ref 80.0–100.0)
NRBC: 0 % (ref 0.0–0.2)
Platelets: 310 10*3/uL (ref 150–400)
RBC: 4.62 MIL/uL (ref 4.22–5.81)
RDW: 13.7 % (ref 11.5–15.5)
WBC: 8.4 10*3/uL (ref 4.0–10.5)

## 2018-01-20 LAB — COMPREHENSIVE METABOLIC PANEL
ALT: 30 U/L (ref 0–44)
ANION GAP: 9 (ref 5–15)
AST: 35 U/L (ref 15–41)
Albumin: 4.5 g/dL (ref 3.5–5.0)
Alkaline Phosphatase: 50 U/L (ref 38–126)
BUN: 16 mg/dL (ref 6–20)
CALCIUM: 8.8 mg/dL — AB (ref 8.9–10.3)
CO2: 28 mmol/L (ref 22–32)
Chloride: 103 mmol/L (ref 98–111)
Creatinine, Ser: 1.23 mg/dL (ref 0.61–1.24)
GFR calc non Af Amer: >60 mL/min (ref >60)
Glucose, Bld: 111 mg/dL — ABNORMAL HIGH (ref 70–99)
Potassium: 3.2 mmol/L — ABNORMAL LOW (ref 3.5–5.1)
Sodium: 140 mmol/L (ref 135–145)
TOTAL PROTEIN: 7.3 g/dL (ref 6.5–8.1)
Total Bilirubin: 1 mg/dL (ref 0.3–1.2)

## 2018-01-20 LAB — RAPID URINE DRUG SCREEN, HOSP PERFORMED
Amphetamines: POSITIVE — AB
BENZODIAZEPINES: NOT DETECTED
Barbiturates: NOT DETECTED
COCAINE: POSITIVE — AB
OPIATES: NOT DETECTED
TETRAHYDROCANNABINOL: POSITIVE — AB

## 2018-01-20 LAB — ETHANOL: Alcohol, Ethyl (B): <10 mg/dL (ref <10)

## 2018-01-20 LAB — ACETAMINOPHEN LEVEL

## 2018-01-20 LAB — SALICYLATE LEVEL: Salicylate Lvl: <7 mg/dL (ref 2.8–30.0)

## 2018-01-20 MED ORDER — ALBUTEROL SULFATE HFA 108 (90 BASE) MCG/ACT IN AERS
2.0000 | INHALATION_SPRAY | Freq: Four times a day (QID) | RESPIRATORY_TRACT | Status: DC | PRN
  Administered 2018-01-21 – 2018-01-23 (×2): 2 via RESPIRATORY_TRACT
  Filled 2018-01-20: qty 6.7

## 2018-01-20 MED ORDER — ALBUTEROL SULFATE HFA 108 (90 BASE) MCG/ACT IN AERS
2.0000 | INHALATION_SPRAY | RESPIRATORY_TRACT | Status: DC | PRN
  Administered 2018-01-20: 2 via RESPIRATORY_TRACT
  Filled 2018-01-20: qty 6.7
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
## 2018-01-20 NOTE — BH Assessment (Signed)
[facility] Assessment Progress Note  Per Buford Dresser, DO, this pt requires psychiatric hospitalization at this time.  Letitia Libra, RN, [facility] has assigned pt to [facility] Rm [id number]; [facility] will be ready to receive pt at 21:30.  Pt has signed Voluntary Admission and Consent for Treatment, as well as Consent to Release Information to no one, and signed forms have been faxed to [facility].  Pt's nurse, Diane, has been notified, and agrees to send original paperwork along with pt via [place] [place], and to call report to [phone].  Jalene Mullet, [place] Coordinator [phone]

## 2018-01-20 NOTE — ED Notes (Signed)
Pt in bed resting with eyes closed.

## 2018-01-20 NOTE — ED Triage Notes (Signed)
Pt tearful and not able to be a good historian. Indicates through tears that he is suicidal but could not verbalize a plan or intent. Able to share that he did use ETOH and cocaine last night. Pt is cooperative and allowed VS to be taken and gave a urine sample. Denies HI and AVH.

## 2018-01-20 NOTE — ED Notes (Addendum)
Accidentally completed discharge on pt who is not discharged at this time. Case of wrong pt. Pt in room calm and cooperative.

## 2018-01-20 NOTE — ED Notes (Signed)
Pt alert and resting in bed. Pt denies any si,hi or avh at this time. Pt resting in bed, will continue to monitor.

## 2018-01-20 NOTE — ED Notes (Signed)
Pt transferred to [facility] via [place]. Pt belongings given to transport. Pt stable.

## 2018-01-21 ENCOUNTER — Encounter (HOSPITAL_COMMUNITY): Payer: Self-pay

## 2018-01-21 ENCOUNTER — Other Ambulatory Visit: Payer: Self-pay

## 2018-01-21 DIAGNOSIS — F3181 Bipolar II disorder: Secondary | ICD-10-CM | POA: Diagnosis present

## 2018-01-21 MED ORDER — FAMOTIDINE 20 MG PO TABS
20.0000 mg | ORAL_TABLET | Freq: Every day | ORAL | Status: DC
  Administered 2018-01-21 – 2018-01-24 (×4): 20 mg via ORAL
  Filled 2018-01-21 (×3): qty 1
  Filled 2018-01-21: qty 7
  Filled 2018-01-21 (×2): qty 1

## 2018-01-21 MED ORDER — DIVALPROEX SODIUM 250 MG PO DR TAB
250.0000 mg | DELAYED_RELEASE_TABLET | Freq: Three times a day (TID) | ORAL | Status: DC
  Administered 2018-01-21 – 2018-01-24 (×8): 250 mg via ORAL
  Filled 2018-01-21: qty 21
  Filled 2018-01-21 (×8): qty 1
  Filled 2018-01-21 (×2): qty 21
  Filled 2018-01-21 (×4): qty 1

## 2018-01-21 MED ORDER — ALBUTEROL SULFATE HFA 108 (90 BASE) MCG/ACT IN AERS: 1.0000 | INHALATION_SPRAY | Freq: Four times a day (QID) | RESPIRATORY_TRACT | Status: DC | PRN

## 2018-01-21 MED ORDER — ALUM & MAG HYDROXIDE-SIMETH 200-200-20 MG/5ML PO SUSP: 30.0000 mL | ORAL | Status: DC | PRN

## 2018-01-21 MED ORDER — QUETIAPINE FUMARATE 100 MG PO TABS
100.0000 mg | ORAL_TABLET | Freq: Three times a day (TID) | ORAL | Status: DC
  Administered 2018-01-21 (×2): 100 mg via ORAL
  Filled 2018-01-21 (×9): qty 1

## 2018-01-21 MED ORDER — TRAZODONE HCL 50 MG PO TABS: 50.0000 mg | ORAL_TABLET | Freq: Every evening | ORAL | Status: DC | PRN

## 2018-01-21 MED ORDER — ACETAMINOPHEN 325 MG PO TABS
650.0000 mg | ORAL_TABLET | Freq: Four times a day (QID) | ORAL | Status: DC | PRN
  Administered 2018-01-21: 650 mg via ORAL
  Filled 2018-01-21 (×2): qty 2

## 2018-01-21 MED ORDER — CLONAZEPAM 1 MG PO TABS
2.0000 mg | ORAL_TABLET | Freq: Once | ORAL | Status: AC
  Administered 2018-01-21: 2 mg via ORAL
  Filled 2018-01-21: qty 2

## 2018-01-21 MED ORDER — HYDROXYZINE HCL 25 MG PO TABS
25.0000 mg | ORAL_TABLET | Freq: Three times a day (TID) | ORAL | Status: DC | PRN
  Administered 2018-01-23: 25 mg via ORAL
  Filled 2018-01-21 (×2): qty 1
  Filled 2018-01-21: qty 10

## 2018-01-21 MED ORDER — LEVOTHYROXINE SODIUM 150 MCG PO TABS
150.0000 ug | ORAL_TABLET | Freq: Every day | ORAL | Status: DC
  Administered 2018-01-21 – 2018-01-24 (×4): 150 ug via ORAL
  Filled 2018-01-21 (×3): qty 1
  Filled 2018-01-21: qty 7
  Filled 2018-01-21: qty 2
  Filled 2018-01-21 (×2): qty 1

## 2018-01-21 MED ORDER — MAGNESIUM HYDROXIDE 400 MG/5ML PO SUSP: 30.0000 mL | Freq: Every day | ORAL | Status: DC | PRN

## 2018-01-21 MED ORDER — AMLODIPINE BESYLATE 10 MG PO TABS
10.0000 mg | ORAL_TABLET | Freq: Every day | ORAL | Status: DC
  Administered 2018-01-21 – 2018-01-24 (×4): 10 mg via ORAL
  Filled 2018-01-21 (×5): qty 1
  Filled 2018-01-21: qty 7

## 2018-01-21 MED ORDER — TRAZODONE HCL 150 MG PO TABS
150.0000 mg | ORAL_TABLET | Freq: Every evening | ORAL | Status: DC | PRN
  Administered 2018-01-23 (×2): 150 mg via ORAL
  Filled 2018-01-21: qty 7
  Filled 2018-01-21 (×3): qty 1

## 2018-01-21 MED ORDER — CLONIDINE HCL 0.1 MG PO TABS: 0.2000 mg | ORAL_TABLET | Freq: Four times a day (QID) | ORAL | Status: DC | PRN

## 2018-01-21 NOTE — Progress Notes (Signed)
Patient went to dining room and ate dinner.  Patient got out of his chair and felt faint and lightheaded.  Wheelchair brought to dining room and patient was returned to his room.  BP 119/82, P68, 99% RA while patient laying in bed.   Patient resting in bed.  Respirations even and unlabored.  No signs/symptoms of pain/distress noted on patient's face/body movements.

## 2018-01-21 NOTE — Progress Notes (Signed)
D:  Patient's self inventory sheet, patient sleeps good, no sleep medication.  Good appetite, low energy level, poor concentration.  Rated depression, anxiety, coping skills with patient.  Denied withdrawals.  SI, sometimes, almost all the time, no plan, contracts for safety.  Physical pain, R foot and back, worst pain #9, pain medication helpful.  Goal is life, plans to pray.  No discharge plans. A:  Medications administered per MD orders.  Emotional support and encouragement given patient. R:  Denied HI.  SI, no plans, contracts for safety. Denied A/V hallucinations.  Safety maintained with 15 minute checks. Patient stated he is not hearing any voices today.  Will discuss medications with MD.  Patient stated he takes medication for gout at home.

## 2018-01-21 NOTE — Progress Notes (Signed)
Recreation Therapy Notes  Date: 11.8.19 Time: 1000 Location: 500 Hall Dayroom  Group Topic:  Music Therapy  Goal Area(s) Addresses:  Patient will identify ways in which music is beneficial. Patient will identify different emotions music can bring out.  Intervention: Music  Activity: Music Therapy.  LRT played music for patients to relax and enjoy some soothing tunes.  Patients could make requests of songs they wanted to hear.  Education: Communication, Discharge Planning  Education Outcome: Acknowledges understanding/In group clarification offered/Needs additional education.   Clinical Observations/Feedback: Pt did not attend group.    Victorino Sparrow, LRT/CTRS        Ria Comment, Nickey Canedo A 01/21/2018 12:12 PM

## 2018-01-21 NOTE — Tx Team (Signed)
Initial Treatment Plan 01/21/2018 1:24 AM Ferman Hamming [id number]    PATIENT STRESSORS: Financial difficulties Substance abuse Traumatic event   PATIENT STRENGTHS: Communication skills General fund of knowledge Physical Health   PATIENT IDENTIFIED PROBLEMS: Bipolar 2  "Sobriety"  "My attitude"                 DISCHARGE CRITERIA:  Ability to meet basic life and health needs Improved stabilization in mood, thinking, and/or behavior Reduction of life-threatening or endangering symptoms to within safe limits Safe-care adequate arrangements made  PRELIMINARY DISCHARGE PLAN: Attend aftercare/continuing care group Attend 12-step recovery group Outpatient therapy Participate in family therapy Return to previous living arrangement  PATIENT/FAMILY INVOLVEMENT: This treatment plan has been presented to and reviewed with the patient, EMERICK WEATHERLY, and/or family member.  The patient and family have been given the opportunity to ask questions and make suggestions.  Hermelinda Medicus, RN 01/21/2018, 1:24 AM
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
## 2018-01-21 NOTE — BHH Counselor (Signed)
CSW attempted to complete assessment with pt. Pt sleeping. Will try again tomorrow.  Jaxx Huish S. Ouida Sills, MSW, LCSW Clinical Social Worker 01/21/2018 3:04 PM

## 2018-01-21 NOTE — Progress Notes (Signed)
Pt admitted  Vol  to [facility] unit @ 2306. Pt was able to ambulate onto the unit with out issue. Pt presents with  Depressed mood and flat affect. Pt stated that he has had SI thoughts the past 2 days with no plan.Currently has some passive SI, Pt able to verbally contract for safety. Denies HI or AVH. Pt stated that he is here because of his substance abuse issues and needs help. Assessment completed without issue and consents signed. Pt's belongings searched and placed in locker by staff. Skin assessment completed by this RN and Taffney MHT  Per protocol with no abnormalities or contraband found. Pt oriented to room and unit routine, provided unit handbook, toiletries, and folder. Pt provided snack and drink. Pt denied pain or need at this time. Encouraged to express questions, needs, or concerns. Pt educated on falls and encouraged to wear nonskid socks when ambulating in the halls. Pt also encouraged to call for help if feeling weak or dizzy. Pt verbalized understanding of all education provided. No signs or symptoms of pain or distress noted. Pt in currently in room, verbalizes understanding of education provided. Pt continues to remain safe on the unit, q 15 min observations initiated and in place. RN will continue to monitor.Patient ID: Nicholas Mccarty, male   DOB: Sep 25, 1970, 47 y.o.   MRN: [id number]

## 2018-01-21 NOTE — Plan of Care (Signed)
Nurse discussed anxiety, depression, coping skills with patient. 

## 2018-01-21 NOTE — Tx Team (Signed)
Interdisciplinary Treatment and Diagnostic Plan Update  01/21/2018 Time of Session: 0830AM Nicholas Mccarty MRN: [id number]  Principal Diagnosis: Bipolar Disorder 2  Secondary Diagnoses: Active Problems:   Bipolar 2 disorder, major depressive episode (HCC)   Current Medications:  Current Facility-Administered Medications  Medication Dose Route Frequency Provider Last Rate Last Dose  . acetaminophen (TYLENOL) tablet 650 mg  650 mg Oral Q6H PRN Ethelene Hal, NP   650 mg at 01/21/18 0829  . albuterol (PROVENTIL HFA;VENTOLIN HFA) 108 (90 Base) MCG/ACT inhaler 1-2 puff  1-2 puff Inhalation Q6H PRN Ethelene Hal, NP      . albuterol (PROVENTIL HFA;VENTOLIN HFA) 108 (90 Base) MCG/ACT inhaler 2 puff  2 puff Inhalation Q6H PRN Laverle Hobby, PA-C   2 puff at 01/21/18 0615  . alum & mag hydroxide-simeth (MAALOX/MYLANTA) 200-200-20 MG/5ML suspension 30 mL  30 mL Oral Q4H PRN Ethelene Hal, NP      . amLODipine (NORVASC) tablet 10 mg  10 mg Oral Daily Ethelene Hal, NP   10 mg at 01/21/18 0824  . cloNIDine (CATAPRES) tablet 0.2 mg  0.2 mg Oral Q6H PRN Johnn Hai, MD      . divalproex (DEPAKOTE) DR tablet 250 mg  250 mg Oral TID Johnn Hai, MD   250 mg at 01/21/18 1129  . famotidine (PEPCID) tablet 20 mg  20 mg Oral Daily Ethelene Hal, NP   20 mg at 01/21/18 4970  . hydrOXYzine (ATARAX/VISTARIL) tablet 25 mg  25 mg Oral TID PRN Ethelene Hal, NP      . levothyroxine (SYNTHROID, LEVOTHROID) tablet 150 mcg  150 mcg Oral QAC breakfast Ethelene Hal, NP   150 mcg at 01/21/18 2637  . magnesium hydroxide (MILK OF MAGNESIA) suspension 30 mL  30 mL Oral Daily PRN Ethelene Hal, NP      . QUEtiapine (SEROQUEL) tablet 100 mg  100 mg Oral TID Johnn Hai, MD   100 mg at 01/21/18 1129  . traZODone (DESYREL) tablet 150 mg  150 mg Oral QHS PRN Johnn Hai, MD       PTA Medications: Facility-Administered Medications Prior to Admission   Medication Dose Route Frequency Provider Last Rate Last Dose  . 0.9 %  sodium chloride infusion  500 mL Intravenous Once Milus Banister, MD       Medications Prior to Admission  Medication Sig Dispense Refill Last Dose  . albuterol (PROVENTIL HFA;VENTOLIN HFA) 108 (90 Base) MCG/ACT inhaler Inhale 1-2 puffs into the lungs every 6 (six) hours as needed for wheezing or shortness of breath. 1 Inhaler 11 01/20/2018 at Unknown time  . albuterol (PROVENTIL) (2.5 MG/3ML) 0.083% nebulizer solution Take 3 mLs (2.5 mg total) by nebulization every 6 (six) hours as needed for wheezing or shortness of breath. 150 mL 11 Past Week at Unknown time  . amLODipine (NORVASC) 10 MG tablet Take 1 tablet (10 mg total) by mouth daily. For high blood pressure 30 tablet 6 01/20/2018 at Unknown time  . cetirizine (ZYRTEC) 10 MG tablet Take 1 tablet (10 mg total) by mouth daily. (Patient not taking: Reported on 01/20/2018) 30 tablet 11 Not Taking at Unknown time  . colchicine 0.6 MG tablet Take 1 tablet (0.6 mg total) by mouth 2 (two) times daily. 60 tablet 3 01/20/2018 at Unknown time  . cyclobenzaprine (FLEXERIL) 10 MG tablet Take 1 tablet (10 mg total) by mouth 3 (three) times daily as needed for muscle spasms. 30 tablet 2 01/19/2018  at Unknown time  . fluticasone (FLONASE) 50 MCG/ACT nasal spray Place 2 sprays into both nostrils daily. (Patient taking differently: Place 2 sprays into both nostrils daily as needed for allergies. ) 16 g 6 01/20/2018 at Unknown time  . levothyroxine (SYNTHROID, LEVOTHROID) 150 MCG tablet Take 1 tablet (150 mcg total) by mouth daily before breakfast. 30 tablet 1 01/20/2018 at Unknown time  . ranitidine (ZANTAC) 150 MG tablet Take 150 mg by mouth 2 (two) times daily as needed for heartburn.    01/19/2018 at Unknown time  . sodium chloride (OCEAN) 0.65 % SOLN nasal spray Place 1 spray into both nostrils as needed for congestion. (Patient not taking: Reported on 01/20/2018) 1 Bottle 3 Not Taking at  Unknown time    Patient Stressors: Financial difficulties Substance abuse Traumatic event  Patient Strengths: Curator fund of knowledge Physical Health  Treatment Modalities: Medication Management, Group therapy, Case management,  1 to 1 session with clinician, Psychoeducation, Recreational therapy.   Physician Treatment Plan for Primary Diagnosis: Bipolar Disorder 2 Long Term Goal(s): Improvement in symptoms so as ready for discharge Improvement in symptoms so as ready for discharge   Short Term Goals: Ability to identify changes in lifestyle to reduce recurrence of condition will improve Ability to identify changes in lifestyle to reduce recurrence of condition will improve  Medication Management: Evaluate patient's response, side effects, and tolerance of medication regimen.  Therapeutic Interventions: 1 to 1 sessions, Unit Group sessions and Medication administration.  Evaluation of Outcomes: Not Met  Physician Treatment Plan for Secondary Diagnosis: Active Problems:   Bipolar 2 disorder, major depressive episode ([place])  Long Term Goal(s): Improvement in symptoms so as ready for discharge Improvement in symptoms so as ready for discharge   Short Term Goals: Ability to identify changes in lifestyle to reduce recurrence of condition will improve Ability to identify changes in lifestyle to reduce recurrence of condition will improve     Medication Management: Evaluate patient's response, side effects, and tolerance of medication regimen.  Therapeutic Interventions: 1 to 1 sessions, Unit Group sessions and Medication administration.  Evaluation of Outcomes: Not Met   RN Treatment Plan for Primary Diagnosis: Bipolar Disorder 2 Long Term Goal(s): Knowledge of disease and therapeutic regimen to maintain health will improve  Short Term Goals: Ability to remain free from injury will improve, Ability to verbalize frustration and anger appropriately will  improve, Ability to demonstrate self-control and Ability to disclose and discuss suicidal ideas  Medication Management: RN will administer medications as ordered by provider, will assess and evaluate patient's response and provide education to patient for prescribed medication. RN will report any adverse and/or side effects to prescribing provider.  Therapeutic Interventions: 1 on 1 counseling sessions, Psychoeducation, Medication administration, Evaluate responses to treatment, Monitor vital signs and CBGs as ordered, Perform/monitor CIWA, COWS, AIMS and Fall Risk screenings as ordered, Perform wound care treatments as ordered.  Evaluation of Outcomes: Not Met   LCSW Treatment Plan for Primary Diagnosis: Bipolar Disorder 2 Long Term Goal(s): Safe transition to appropriate next level of care at discharge, Engage patient in therapeutic group addressing interpersonal concerns.  Short Term Goals: Engage patient in aftercare planning with referrals and resources, Increase emotional regulation, Facilitate acceptance of mental health diagnosis and concerns and Identify triggers associated with mental health/substance abuse issues  Therapeutic Interventions: Assess for all discharge needs, 1 to 1 time with Social worker, Explore available resources and support systems, Assess for adequacy in community support network, Educate family  and significant other(s) on suicide prevention, Complete Psychosocial Assessment, Interpersonal group therapy.  Evaluation of Outcomes: Not Met   Progress in Treatment: Attending groups: No. New to unit. Continuing to assess.  Participating in groups: No. Taking medication as prescribed: Yes. Toleration medication: Yes. Family/Significant other contact made: No, will contact:  family member if pt consents to collateral contact.  Patient understands diagnosis: Yes. Discussing patient identified problems/goals with staff: Yes. Medical problems stabilized or resolved:  Yes. Denies suicidal/homicidal ideation: No. Passive Si/Able to contract for safety on the unit.  Issues/concerns per patient self-inventory: No. Other: n/a   New problem(s) identified: No, Describe:  n/a  New Short Term/Long Term Goal(s): detox, elimination of AH, medication management for mood stabilization; elimination of SI thoughts; development of comprehensive mental wellness/sobriety plan.   Patient Goals:  "to get some sobriety and some help with voices."   Discharge Plan or Barriers: CSW assessing for appropriate referrals. Pt was last admitted 07/2017 and reports that he did not follow-up with outpatient recommendations to enter [facility] program. Pt also linked to [facility] for psychiatric med management and [facility] for primary care. [place] pamphlet, Mobile Crisis information, and AA/NA information provided to patient for additional community support and resources.   Reason for Continuation of Hospitalization: Anxiety Depression Hallucinations Medication stabilization Suicidal ideation Withdrawal symptoms  Estimated Length of Stay: Wed, 01/26/18  Attendees: Patient: 01/21/2018 3:04 PM  Physician: Dr. Jake Samples MD 01/21/2018 3:04 PM  Nursing: Rise Paganini RN; Legrand Como RN 01/21/2018 3:04 PM  RN Care Manager:x 01/21/2018 3:04 PM  Social Worker: Janice Norrie LCSW 01/21/2018 3:04 PM  Recreational Therapist: x 01/21/2018 3:04 PM  Other: Lindell Spar NP 01/21/2018 3:04 PM  Other:  01/21/2018 3:04 PM  Other: 01/21/2018 3:04 PM    Scribe for Treatment Team: Avelina Laine, LCSW 01/21/2018 3:04 PM

## 2018-01-21 NOTE — Progress Notes (Signed)
Recreation Therapy Notes  INPATIENT RECREATION THERAPY ASSESSMENT  Patient Details Name: Nicholas Mccarty MRN: [id number] DOB: December 03, 1970 Today's Date: 01/21/2018       Information Obtained From: Patient  Able to Participate in Assessment/Interview: Yes  Patient Presentation: Alert  Reason for Admission (Per Patient): Suicidal Ideation  Patient Stressors: Family, Work  Coping Skills:   Building control surveyor, Sports, TV, Music, Exercise, Substance Abuse, Impulsivity, Talk, Prayer, Avoidance, Hot Bath/Shower  Leisure Interests (2+):  Sports - Basketball, Individual - Other (Comment)(Spend time with dogs)  Frequency of Recreation/Participation: Weekly(Basketball- Weekly; Dogs- Daily)  Awareness of Community Resources:  Yes  Community Resources:  [employer], Engineer, drilling, 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)  Current Use: Yes  If no, Barriers?:    Expressed Interest in [place]: No  [place] of Residence:  [place]  Patient Main Form of Transportation: Car  Patient Strengths:  Respect people; hardworker  Patient Identified Areas of Improvement:  Stay away from cetain people; Focus more on work  Patient Goal for Hospitalization:  "Get closer to God"  Current SI (including self-harm):  No  Current HI:  No  Current AVH: No  Staff Intervention Plan: Group Attendance, Collaborate with Interdisciplinary Treatment Team  Consent to Intern Participation: N/A    Victorino Sparrow, LRT/CTRS  Victorino Sparrow A 01/21/2018, 1:02 PM

## 2018-01-22 DIAGNOSIS — E039 Hypothyroidism, unspecified: Secondary | ICD-10-CM

## 2018-01-22 DIAGNOSIS — F419 Anxiety disorder, unspecified: Secondary | ICD-10-CM

## 2018-01-22 DIAGNOSIS — F3181 Bipolar II disorder: Principal | ICD-10-CM

## 2018-01-22 DIAGNOSIS — G4700 Insomnia, unspecified: Secondary | ICD-10-CM

## 2018-01-22 MED ORDER — ARIPIPRAZOLE 5 MG PO TABS
5.0000 mg | ORAL_TABLET | Freq: Every day | ORAL | Status: DC
  Administered 2018-01-22 – 2018-01-23 (×2): 5 mg via ORAL
  Filled 2018-01-22 (×3): qty 1
  Filled 2018-01-22: qty 7

## 2018-01-22 NOTE — BHH Group Notes (Signed)
[facility] Group Notes: (Clinical Social Work)   01/22/2018      Type of Therapy:  Group Therapy   Participation Level:  Did Not Attend despite MHT prompting   Selmer Dominion, LCSW 01/22/2018, 12:12 PM

## 2018-01-22 NOTE — Progress Notes (Signed)
D: Pt passive SI-contracts. Pt been in room majority of the evening, pt made high fall risk due to syncopal episodes.  A: Pt was offered support and encouragement. Pt was given scheduled medications. Pt was encourage to attend groups. Q 15 minute checks were done for safety.  R: safety maintained on unit.  Problem: Activity: Goal: Sleeping patterns will improve Outcome: Progressing   Problem: Safety: Goal: Periods of time without injury will increase Outcome: Progressing

## 2018-01-22 NOTE — BHH Counselor (Signed)
Adult Comprehensive Assessment  Patient ID: Nicholas Mccarty, male   DOB: [birth year], 47 y.o.   MRN: [id number]  Information Source: Information source: Patient  Current Stressors:  Patient states their primary concerns and needs for treatment are:: "If I want to live or die." Patient states their goals for this hospitilization and ongoing recovery are:: "Help me to decide whether to live or die." Educational / Learning stressors: Did not go past 8th grade, stresses him. Employment / Job issues: Lied about his experience to get the job, and is afraid they will find out. Family Relationships: Since father died, seems nobody cares about anyone until they need someone.  Has been separated from wife 2 years, and she took their 73yo daughter to [place], [place] let him see daughter. Financial / Lack of resources (include bankruptcy): Denies stressors. Housing / Lack of housing: Does not enjoy his environment Physical health (include injuries & life threatening diseases): Plantar fascitis in right foot, constant pain. Social relationships: Stress with girlfriend will trigger him to use drugs. Substance abuse: Occasional abuse of cocaine (crack in the past) Bereavement / Loss: Father - 4 years ago.  Living/Environment/Situation:  Living Arrangements: Spouse/significant other, Non-relatives/Friends Living conditions (as described by patient or guardian): Good, but she likes to keep junk around, so they argue a lot. Who else lives in the home?: Girlfriend & her two sons How long has patient lived in current situation?: 2 years What is atmosphere in current home: Other (Comment), Temporary(Argumentative)  Family History:  Marital status: Long term relationship Long term relationship, how long?: 2 years What types of issues is patient dealing with in the relationship?: She keeps too much junk around.  Her 20yo son gets treated by her like he is 2yo, and pt is trying to teach him better.  Is also separated  2 years from his wife, who will not allow him to see their 30yo daughter. Additional relationship information: Wants to get out of the home onto his own Are you sexually active?: Yes What is your sexual orientation?: heterosexual  Has your sexual activity been affected by drugs, alcohol, medication, or emotional stress?: did not state Does patient have children?: Yes How many children?: 5 How is patient's relationship with their children?: good relationship with children; 4 are grown and live on their own, one is 3yo and the mother will not allow him to see her.  Childhood History:  By whom was/is the patient raised?: Both parents Description of patient's relationship with caregiver when they were a child: good growing up with his parents Patient's description of current relationship with people who raised him/her: Both parents are deceased How were you disciplined when you got in trouble as a child/adolescent?: spanked Does patient have siblings?: Yes Number of Siblings: 3 Description of patient's current relationship with siblings: Distant since father died with sister, but close to brother Did patient suffer any verbal/emotional/physical/sexual abuse as a child?: No Did patient suffer from severe childhood neglect?: No Has patient ever been sexually abused/assaulted/raped as an adolescent or adult?: No Was the patient ever a victim of a crime or a disaster?: No Witnessed domestic violence?: No Has patient been effected by domestic violence as an adult?: No  Education:  Highest grade of school patient has completed: 8th grade Currently a student?: No Learning disability?: No  Employment/Work Situation:   Employment situation: Employed 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 How long has patient been employed?: 2 months Patient's job has been impacted by current illness: No  What is the longest time patient has a held a job?: 2 years Where was the patient employed at that  time?: [place]  Did You Receive Any Psychiatric Treatment/Services While in [employer]?: No Are There Guns or Other Weapons in [place]?: No  Financial Resources:   Financial resources: Income from employment Does patient have a representative payee or guardian?: No  Alcohol/Substance Abuse:   What has been your use of drugs/alcohol within the last 12 months?: Powder cocaine weekly or every other week; Crack cocaine in the last year but not in the last 8 months; Marijuana daily; Denies use of amphetamines Alcohol/Substance Abuse Treatment Hx: Past Tx, Inpatient, Attends AA/NA If yes, describe treatment: [facility] 2-3 years ago Has alcohol/substance abuse ever caused legal problems?: Yes  Social Support System:   Patient's Community Support System: Fair Dietitian Support System: Brother, girlfriend Type of faith/religion: Christianity How does patient's faith help to cope with current illness?: Investment banker, corporate:   Leisure and Hobbies: movies, play with grandchildren  Strengths/Needs:   What is the patient's perception of their strengths?: Knows God is still with him, Higher Power is helping him Patient states they can use these personal strengths during their treatment to contribute to their recovery: Thinks he needs to pray more and listen and wait for a word. Patient states these barriers may affect/interfere with their treatment: None Patient states these barriers may affect their return to the community: May not be going back to girlfriend's house, does not want to, but does not have a choice. Other important information patient would like considered in planning for their treatment: None  Discharge Plan:   Currently receiving community mental health services: No Patient states concerns and preferences for aftercare planning are: Does not want to go to rehab.  [facility] in the past, willing to return.  Would like med mgmt and therapy.  Does get his regular  medical care with [facility] and would like to go there if possible. Patient states they will know when they are safe and ready for discharge when: When I'm not suicidal anymore. Does patient have access to transportation?: Yes Does patient have financial barriers related to discharge medications?: Yes Patient description of barriers related to discharge medications: income limits, no insurance Will patient be returning to same living situation after discharge?: Yes  Summary/Recommendations:   Summary and Recommendations (to be completed by the evaluator): Patient is a 47yo male readmitted with suicidal ideation with a plan to jump off a bridge and auditory hallucinations commanding him to jump.  He has stopped using crack cocaine but has been using powder cocaine 2-3 times a month, marijuana daily and although his UDS was positive for amphetamines, denies use.  He stopped taking his Depakote about 6 months ago, did not follow up with his aftercare plan after his last hospitalization.   Primary stressors include tension with his girlfriend and a desire to move into his own place, being kept from seeing his 58yo daughter by his estranged wife, being in fear of losing his job, constant pain from plantar fascitis, and not being very close to siblings since his father's death.  Patient will benefit from crisis stabilization, medication evaluation, group therapy and psychoeducation, in addition to case management for discharge planning. At discharge it is recommended that Patient adhere to the established discharge plan and continue in treatment.  Maretta Los. 01/22/2018

## 2018-01-22 NOTE — Progress Notes (Signed)
Nursing Progress Note: 7p-7a D: Pt currently presents with a anxious/pleasant affect and behavior. Pt states "I actually have a really great job, and it really helps to know i've got a steady source of income. ." Interacting appropriately with the milieu. Pt reports good sleep during the previous night with current medication regimen. Pt did attend wrap-up group.  A: Pt provided with medications per providers orders. Pt's labs and vitals were monitored throughout the night. Pt supported emotionally and encouraged to express concerns and questions. Pt educated on medications.  R: Pt's safety ensured with 15 minute and environmental checks. Pt currently denies SI, HI, and AVH. Pt verbally contracts to seek staff if SI,HI, or AVH occurs and to consult with staff before acting on any harmful thoughts. Will continue to monitor.

## 2018-01-22 NOTE — Plan of Care (Signed)
Problem: Education: Goal: Knowledge of  General Education information/materials will improve Outcome: Progressing Goal: Mental status will improve Outcome: Progressing   Problem: Safety: Goal: Periods of time without injury will increase Outcome: Progressing DAR Note: Pt awake in bed at this time.  A & O X3. Endorsed passive SI without plan at this time. Denies HI, AVH and pain when assessed. Presents irritable / agitated and unwilling to participate in scheduled groups "I'm tired, I fell yesterday, I can't take all these medications". Selectively compliant with medications, declined Seroquel when offered "I don't want it, that's what made me fall".  Rates his depression, anxiety and hopelessness all 10/10 states "life issues". Emotional support and encouragement provided to pt. Scheduled medications given with verbal education and effects monitored. Q 15 minutes safety checks maintained without self harm gestures or outburst.  Pt visible in hall for meals and medications. Tolerates all PO intake well. Denies concerns at this time. POC continues for safety and mood stability.
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
## 2018-01-23 NOTE — Progress Notes (Signed)
D: Pt denies SI/HI/AVH. Pt is pleasant and cooperative. Pt visible in the dayroom with peers watching TV. Pt stated he felt good today, pt stated he still had his job and he felt the medications were helping him.  A: Pt was offered support and encouragement. Pt was given scheduled medications. Pt was encourage to attend groups. Q 15 minute checks were done for safety.  R:Pt attends groups and interacts well with peers and staff. Pt is taking medication. Pt has no complaints.Pt receptive to treatment and safety maintained on unit.  Problem: Education: Goal: Emotional status will improve Outcome: Progressing   Problem: Education: Goal: Mental status will improve Outcome: Progressing   Problem: Activity: Goal: Interest or engagement in activities will improve Outcome: Progressing   Problem: Activity: Goal: Sleeping patterns will improve Outcome: Progressing

## 2018-01-23 NOTE — [facility] Group Notes (Signed)
[place] LCSW Group Therapy Note  Date/Time:  01/23/2018  11:00AM-12:00PM  Type of Therapy and Topic:  Group Therapy:  Music and Mood  Participation Level:  Active   Description of Group: In this process group, members listened to a variety of genres of music and identified that different types of music evoke different responses.  Patients were encouraged to identify music that was soothing for them and music that was energizing for them.  Patients discussed how this knowledge can help with wellness and recovery in various ways including managing depression and anxiety as well as encouraging healthy sleep habits.    Therapeutic Goals: 1. Patients will explore the impact of different varieties of music on mood 2. Patients will verbalize the thoughts they have when listening to different types of music 3. Patients will identify music that is soothing to them as well as music that is energizing to them 4. Patients will discuss how to use this knowledge to assist in maintaining wellness and recovery 5. Patients will explore the use of music as a coping skill  Summary of Patient Progress:  At the beginning of group, patient expressed that he felt "amazing" and he talked after each song about its impact on his feelings.  He did have to ask for some music to be stopped because it disturbed him, but stayed in the room and stated the replacement song(s) were beneficial/soothing.  He was called out to see a provider so was not present at the end of group to share how the music had altered his emotional state, if any.  Therapeutic Modalities: Solution Focused Brief Therapy Activity   Selmer Dominion, LCSW

## 2018-01-23 NOTE — Progress Notes (Signed)
D:  Patient's self inventory sheet, patient sleeps good, sleep medication helpful.  Good appetite, normal energy level, good concentration.  Denied depression and hopelessness.  Denied withdrawals.  Denied SI.  Denied physical problems.  Denied physical pain.  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  Plans to pray.  Does have discharge plans. A:  Medications administered per MD orders.  Emotional support and encouragement given patient. R:  Patient denied SI and HI, contracts for safety.  Denied A/V hallucinations.  Safety maintained with 15 minute checks.

## 2018-01-23 NOTE — Progress Notes (Signed)
Adult Psychoeducational Group Note  Date:  01/23/2018 Time:  11:38 PM  Group Topic/Focus:  Wrap-Up Group:   The focus of this group is to help patients review their daily goal of treatment and discuss progress on daily workbooks.  Participation Level:  Active  Participation Quality:  Appropriate  Affect:  Appropriate  Cognitive:  Appropriate  Insight: Appropriate  Engagement in Group:  Engaged  Modes of Intervention:  Discussion  Additional Comments:  Pt stated his goal for today was to contact his employer about his job status. Pt stated he accomplished his goal today and informed his employer he will be back to work on Tuesday. Pt rated his overall day a 10. Pt stated he attend all groups held today. Pt stated talking to his boss and peers help improve his day.  Candy Sledge 01/23/2018, 11:38 PM

## 2018-01-23 NOTE — Progress Notes (Signed)
Patient slept late this morning.  Stated he did not sleep good last night.  Patient denied SI and HI, contracts for safety.  Denied A/V hallucinations. Safety maintained with 15 minute checks.

## 2018-01-23 NOTE — Plan of Care (Signed)
Nurse discussed anxiety, depression and coping skills with patient.  

## 2018-01-24 MED ORDER — CLONIDINE HCL 0.2 MG PO TABS: 0.2000 mg | ORAL_TABLET | Freq: Four times a day (QID) | ORAL | 0 refills | Status: DC | PRN

## 2018-01-24 MED ORDER — ARIPIPRAZOLE 5 MG PO TABS: 5.0000 mg | ORAL_TABLET | Freq: Every day | ORAL | 0 refills | Status: DC

## 2018-01-24 MED ORDER — DIVALPROEX SODIUM 250 MG PO DR TAB: 250.0000 mg | DELAYED_RELEASE_TABLET | Freq: Three times a day (TID) | ORAL | 0 refills | Status: DC

## 2018-01-24 MED ORDER — TRAZODONE HCL 150 MG PO TABS: 150.0000 mg | ORAL_TABLET | Freq: Every evening | ORAL | 0 refills | Status: DC | PRN

## 2018-01-24 MED FILL — ?CLONIDINE HCL 0.2 MG TABLE: 0.2 | 7 days supply | Qty: 30 | Fill #0
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
## 2018-01-24 NOTE — BHH Suicide Risk Assessment (Signed)
[facility] Discharge Suicide Risk Assessment   Principal Problem: schizophreniform Discharge Diagnoses: MDD Patient Active Problem List   Diagnosis Date Noted  . Bipolar 2 disorder, major depressive episode ([place]) [F31.81] 01/21/2018  . Severe recurrent major depression without psychotic features ([place]) [F33.2] 08/09/2017  . Right knee pain [M25.561] 04/12/2016  . Cocaine-induced mood disorder ([place]) [F14.94] 03/15/2015  . Bipolar disorder, curr episode mixed, severe, with psychotic features ([place]) [F31.64] 03/04/2015  . Cannabis use disorder, severe, dependence ([place]) [F12.20] 03/04/2015  . Hypokalemia [E87.6] 03/04/2015  . Cocaine use disorder, severe, dependence ([place]) [F14.20] 03/03/2015  . Pneumothorax on right [J93.9] 01/02/2013  . Acid reflux [K21.9] 07/08/2012  . Malignant neoplasm of thyroid gland ([place]) [C73] 07/08/2012  . Other abnormal glucose [R73.09] 09/02/2011  . Plantar fasciitis, bilateral [M72.2] 07/22/2011  . Neuropathic pain of thigh, right [M79.2] 07/22/2011  . 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 Thyroid cancer ([place]) [C73] 04/27/2011    Total Time spent with patient: 30 minutes  Musculoskeletal: Strength & Muscle Tone: within normal limits Gait & Station: normal Patient leans: n/a  Psychiatric Specialty Exam: Review of Systems  Neurological: Seizures: n/a.    Blood pressure (!) 132/97, pulse 94, temperature 99.2 F (37.3 C), temperature source Oral, resp. rate 18, height 6\' 2"  (1.88 m), weight 98 kg, SpO2 99 %.Body mass index is 27.73 kg/m.  General Appearance: Casual  Eye Contact::  Good  Speech:  Clear and Coherent409  Volume:  Normal  Mood:  Euthymic  Affect:  Congruent  Thought Process:  Coherent  Orientation: x3  Thought Content:  Logical  Suicidal Thoughts:  No  Homicidal Thoughts:  No  Memory:  NA  Judgement:  Good  Insight:  Good  Psychomotor Activity:  Normal  Concentration:  Good  Recall:  Good  Fund of Knowledge:Good  Language:  Good  Akathisia:  Negative  Handed:  Right  AIMS (if indicated):     Assets:  Communication Skills  Sleep:  Number of Hours: 6  Cognition: WNL  ADL's:  Intact   Mental Status Per Nursing Assessment::   On Admission:  Suicidal ideation indicated by patient  Demographic Factors:  Male  Loss Factors: NA  Historical Factors: NA  Risk Reduction Factors:   NA  Continued Clinical Symptoms:  Depression:   Severe  Cognitive Features That Contribute To Risk:  None    Suicide Risk:  Minimal: No identifiable suicidal ideation.  Patients presenting with no risk factors but with morbid ruminations; may be classified as minimal risk based on the severity of the depressive symptoms  Follow-up Information    [facility] Follow up.   Specialty:  Behavioral Health Why:  appt needed.  Contact information: [place] [address] [address] Follow up.   Specialty:  Internal Medicine Contact information: [place] [phone]          Plan Of Care/Follow-up recommendations:  Activity:  full  Oddie Kuhlmann, MD 01/24/2018, 8:43 AM

## 2018-01-24 NOTE — Progress Notes (Signed)
Recreation Therapy Notes  INPATIENT RECREATION TR PLAN  Patient Details Name: Nicholas Mccarty MRN: [id number] DOB: 27-Jul-1970 Today's Date: 01/24/2018  Rec Therapy Plan Is patient appropriate for Therapeutic Recreation?: Yes Treatment times per week: about 3 days Estimated Length of Stay: 5-7 days TR Treatment/Interventions: Group participation (Comment)  Discharge Criteria Pt will be discharged from therapy if:: Discharged Treatment plan/goals/alternatives discussed and agreed upon by:: Patient/family  Discharge Summary Short term goals set: See patient care plan Short term goals met: Not met Reason goals not met: Pt did not attend group sessions. Therapeutic equipment acquired: N/A Reason patient discharged from therapy: Discharge from hospital Pt/family agrees with progress & goals achieved: Yes Date patient discharged from therapy: 01/24/18    Victorino Sparrow, LRT/CTRS  Ria Comment, Laraina Sulton A 01/24/2018, 11:30 AM

## 2018-01-24 NOTE — Progress Notes (Signed)
  [facility] Adult Case Management Discharge Plan :  Will you be returning to the same living situation after discharge:  Yes,  with girlfriend At discharge, do you have transportation home?: Yes,  girlfriend Do you have the ability to pay for your medications: No. Will work with [employer].  Release of information consent forms completed and in the chart;  Patient's signature needed at discharge.  Patient to Follow up at: Follow-up Information    [facility]. Go on 01/26/2018.   Specialty:  Behavioral Health Why:  Your hospital follow up is Wednesday November 13 at 8:00 a.m.  Contact information: [place] [address] [address]. Go on 01/31/2018.   Specialty:  Internal Medicine Why:  Your medication management appointment is Monday November 18 at 11:00a.  Contact information: [place] [phone]          Next level of care provider has access to [place] and Suicide Prevention discussed: Yes,  with girlfriend  Have you used any form of tobacco in the last 30 days? (Cigarettes, Smokeless Tobacco, Cigars, and/or Pipes): Yes  Has patient been referred to the Quitline?: Patient refused referral  Patient has been referred for addiction treatment: Yes  Joanne Chars, [place] 01/24/2018, 10:48 AM

## 2018-01-24 NOTE — Plan of Care (Signed)
Pt did not attend recreation therapy group sessions.   Jaquawn Saffran, LRT/CTRS 

## 2018-01-24 NOTE — Plan of Care (Signed)
Discharge note  Patient verbalizes readiness for discharge. Follow up plan explained, AVS, Transition record and SRA given. Prescriptions and teaching provided. Belongings returned and signed for. Suicide safety plan completed and signed. Suicide prevention hotline sheet provided. Patient verbalizes understanding. Patient denies SI/HI and assures this Probation officer he will seek assistance should that change. Patient discharged to lobby where pt's ride was waiting.  Problem: Education: Goal: Knowledge of [place] [facility] Education information/materials will improve Outcome: Adequate for Discharge Goal: Emotional status will improve Outcome: Adequate for Discharge Goal: Mental status will improve Outcome: Adequate for Discharge Goal: Verbalization of understanding the information provided will improve Outcome: Adequate for Discharge   Problem: Activity: Goal: Interest or engagement in activities will improve Outcome: Adequate for Discharge Goal: Sleeping patterns will improve Outcome: Adequate for Discharge   Problem: Coping: Goal: Ability to verbalize frustrations and anger appropriately will improve Outcome: Adequate for Discharge Goal: Ability to demonstrate self-control will improve Outcome: Adequate for Discharge   Problem: Health Behavior/Discharge Planning: Goal: Identification of resources available to assist in meeting health care needs will improve Outcome: Adequate for Discharge Goal: Compliance with treatment plan for underlying cause of condition will improve Outcome: Adequate for Discharge   Problem: Physical Regulation: Goal: Ability to maintain clinical measurements within normal limits will improve Outcome: Adequate for Discharge   Problem: Safety: Goal: Periods of time without injury will increase Outcome: Adequate for Discharge

## 2018-01-24 NOTE — Progress Notes (Signed)
Recreation Therapy Notes  Date: 11.11.19 Time: 1000 Location: [address] Dayroom  Group Topic: Communication, Team Building, Problem Solving  Goal Area(s) Addresses:  Patient will effectively work with peer towards shared goal.  Patient will identify skill used to make activity successful.  Patient will identify how skills used during activity can be used to reach post d/c goals.   Intervention: STEM Activity   Activity: [employer]. In teams, patients were asked to build the tallest freestanding tower possible out of 15 pipe cleaners. Systematically resources were removed, for example patient ability to use both hands and patient ability to verbally communicate.    Education: Education officer, community, Dentist.   Education Outcome: Acknowledges education/In group clarification offered/Needs additional education.   Clinical Observations/Feedback: Pt did not attend group.     Victorino Sparrow, LRT/CTRS         Victorino Sparrow A 01/24/2018 11:24 AM

## 2018-01-24 NOTE — [facility] Suicide Risk Assessment (Signed)
[place] INPATIENT:  Family/Significant Other Suicide Prevention Education  Suicide Prevention Education:  Education Completed; Tillie Fantasia, girlfriend, [phone], has been identified by the patient as the family member/significant other with whom the patient will be residing, and identified as the person(s) who will aid the patient in the event of a mental health crisis (suicidal ideations/suicide attempt).  With written consent from the patient, the family member/significant other has been provided the following suicide prevention education, prior to the and/or following the discharge of the patient.  The suicide prevention education provided includes the following:  Suicide risk factors  Suicide prevention and interventions  National Suicide Hotline telephone number  [facility] assessment telephone number  [facility] Emergency Assistance [place] and/or Residential Mobile Crisis Unit telephone number  Request made of family/significant other to:  Remove weapons (e.g., guns, rifles, knives), all items previously/currently identified as safety concern.  No guns in the home, per [place].  Remove drugs/medications (over-the-counter, prescriptions, illicit drugs), all items previously/currently identified as a safety concern.  The family member/significant other verbalizes understanding of the suicide prevention education information provided.  The family member/significant other agrees to remove the items of safety concern listed above.  Everlene Farrier reports she has not visited pt while he was at [facility], but she did speak to him by phone every day and he sounds much better.  She is planning to pick him up today.  She will help to make sure he gets to his appts at [facility] as well.   Joanne Chars, LCSW 01/24/2018, 10:45 AM

## 2018-01-31 ENCOUNTER — Telehealth: Payer: Self-pay

## 2018-01-31 ENCOUNTER — Ambulatory Visit: Payer: Self-pay | Admitting: Family Medicine

## 2018-01-31 DIAGNOSIS — J45909 Unspecified asthma, uncomplicated: Secondary | ICD-10-CM

## 2018-01-31 NOTE — Telephone Encounter (Signed)
Patient notified that refills are at the pharmacy and ready for pick up.

## 2018-02-04 ENCOUNTER — Ambulatory Visit: Payer: Self-pay | Admitting: Family Medicine

## 2018-02-07 ENCOUNTER — Ambulatory Visit: Payer: Self-pay | Admitting: Family Medicine

## 2018-02-19 ENCOUNTER — Encounter (HOSPITAL_COMMUNITY): Payer: Self-pay

## 2018-02-19 ENCOUNTER — Emergency Department (HOSPITAL_COMMUNITY)
Admission: EM | Admit: 2018-02-19 | Discharge: 2018-02-20 | Disposition: A | Discharge status: Home / Self Care | Payer: Self-pay | Attending: Emergency Medicine | Admitting: Emergency Medicine

## 2018-02-19 ENCOUNTER — Emergency Department (HOSPITAL_COMMUNITY): Payer: Self-pay

## 2018-02-19 ENCOUNTER — Other Ambulatory Visit: Payer: Self-pay

## 2018-02-19 DIAGNOSIS — F17210 Nicotine dependence, cigarettes, uncomplicated: Secondary | ICD-10-CM | POA: Insufficient documentation

## 2018-02-19 DIAGNOSIS — R45851 Suicidal ideations: Secondary | ICD-10-CM | POA: Insufficient documentation

## 2018-02-19 DIAGNOSIS — Z79899 Other long term (current) drug therapy: Secondary | ICD-10-CM | POA: Insufficient documentation

## 2018-02-19 DIAGNOSIS — E890 Postprocedural hypothyroidism: Secondary | ICD-10-CM | POA: Insufficient documentation

## 2018-02-19 DIAGNOSIS — F3181 Bipolar II disorder: Secondary | ICD-10-CM | POA: Insufficient documentation

## 2018-02-19 DIAGNOSIS — I10 Essential (primary) hypertension: Secondary | ICD-10-CM | POA: Insufficient documentation

## 2018-02-19 DIAGNOSIS — F1410 Cocaine abuse, uncomplicated: Secondary | ICD-10-CM | POA: Insufficient documentation

## 2018-02-19 DIAGNOSIS — F1494 Cocaine use, unspecified with cocaine-induced mood disorder: Secondary | ICD-10-CM | POA: Diagnosis present

## 2018-02-19 DIAGNOSIS — R0789 Other chest pain: Secondary | ICD-10-CM | POA: Insufficient documentation

## 2018-02-19 DIAGNOSIS — J45909 Unspecified asthma, uncomplicated: Secondary | ICD-10-CM | POA: Insufficient documentation

## 2018-02-19 DIAGNOSIS — E876 Hypokalemia: Secondary | ICD-10-CM | POA: Insufficient documentation

## 2018-02-19 LAB — CBC
HEMATOCRIT: 45 % (ref 39.0–52.0)
HEMOGLOBIN: 15.2 g/dL (ref 13.0–17.0)
MCH: 30.2 pg (ref 26.0–34.0)
MCHC: 33.8 g/dL (ref 30.0–36.0)
MCV: 89.5 fL (ref 80.0–100.0)
Platelets: 320 10*3/uL (ref 150–400)
RBC: 5.03 MIL/uL (ref 4.22–5.81)
RDW: 13.5 % (ref 11.5–15.5)
WBC: 9.5 10*3/uL (ref 4.0–10.5)
nRBC: 0 % (ref 0.0–0.2)

## 2018-02-19 LAB — ETHANOL: Alcohol, Ethyl (B): <10 mg/dL (ref <10)

## 2018-02-19 LAB — COMPREHENSIVE METABOLIC PANEL
ALK PHOS: 54 U/L (ref 38–126)
ALT: 34 U/L (ref 0–44)
AST: 44 U/L — ABNORMAL HIGH (ref 15–41)
Albumin: 5 g/dL (ref 3.5–5.0)
Anion gap: 11 (ref 5–15)
BUN: 15 mg/dL (ref 6–20)
CO2: 26 mmol/L (ref 22–32)
Calcium: 9 mg/dL (ref 8.9–10.3)
Chloride: 102 mmol/L (ref 98–111)
Creatinine, Ser: 1.43 mg/dL — ABNORMAL HIGH (ref 0.61–1.24)
GFR calc Af Amer: >60 mL/min (ref >60)
GFR calc non Af Amer: 57 mL/min — ABNORMAL LOW (ref >60)
Glucose, Bld: 93 mg/dL (ref 70–99)
Potassium: 3.2 mmol/L — ABNORMAL LOW (ref 3.5–5.1)
Sodium: 139 mmol/L (ref 135–145)
Total Bilirubin: 1.4 mg/dL — ABNORMAL HIGH (ref 0.3–1.2)
Total Protein: 7.9 g/dL (ref 6.5–8.1)

## 2018-02-19 LAB — I-STAT TROPONIN, ED
Troponin i, poc: 0.01 ng/mL (ref 0.00–0.08)
Troponin i, poc: 0.01 ng/mL (ref 0.00–0.08)

## 2018-02-19 MED ORDER — DIVALPROEX SODIUM 250 MG PO DR TAB
250.0000 mg | DELAYED_RELEASE_TABLET | Freq: Three times a day (TID) | ORAL | Status: DC
  Administered 2018-02-19 (×2): 250 mg via ORAL
  Filled 2018-02-19 (×2): qty 1

## 2018-02-19 MED ORDER — COLCHICINE 0.6 MG PO TABS
0.6000 mg | ORAL_TABLET | Freq: Two times a day (BID) | ORAL | Status: DC
  Administered 2018-02-19: 0.6 mg via ORAL
  Filled 2018-02-19 (×2): qty 1

## 2018-02-19 MED ORDER — LORAZEPAM 2 MG/ML IJ SOLN
1.0000 mg | Freq: Once | INTRAMUSCULAR | Status: AC
  Administered 2018-02-19: 1 mg via INTRAVENOUS
  Filled 2018-02-19: qty 1

## 2018-02-19 MED ORDER — POTASSIUM CHLORIDE CRYS ER 20 MEQ PO TBCR
40.0000 meq | EXTENDED_RELEASE_TABLET | Freq: Once | ORAL | Status: AC
  Administered 2018-02-19: 40 meq via ORAL
  Filled 2018-02-19: qty 2

## 2018-02-19 MED ORDER — TRAZODONE HCL 50 MG PO TABS: 150.0000 mg | ORAL_TABLET | Freq: Every evening | ORAL | Status: DC | PRN

## 2018-02-19 MED ORDER — FLUTICASONE PROPIONATE 50 MCG/ACT NA SUSP
2.0000 | Freq: Every day | NASAL | Status: DC | PRN
  Filled 2018-02-19: qty 16

## 2018-02-19 MED ORDER — NICOTINE 21 MG/24HR TD PT24
21.0000 mg | MEDICATED_PATCH | Freq: Every day | TRANSDERMAL | Status: DC
  Filled 2018-02-19: qty 1

## 2018-02-19 MED ORDER — LEVOTHYROXINE SODIUM 150 MCG PO TABS
150.0000 ug | ORAL_TABLET | Freq: Every day | ORAL | Status: DC
  Administered 2018-02-20: 150 ug via ORAL
  Filled 2018-02-19: qty 1

## 2018-02-19 MED ORDER — CLONIDINE HCL 0.1 MG PO TABS: 0.2000 mg | ORAL_TABLET | Freq: Four times a day (QID) | ORAL | Status: DC | PRN

## 2018-02-19 MED ORDER — ALBUTEROL SULFATE HFA 108 (90 BASE) MCG/ACT IN AERS: 1.0000 | INHALATION_SPRAY | Freq: Four times a day (QID) | RESPIRATORY_TRACT | Status: DC | PRN

## 2018-02-19 MED ORDER — ARIPIPRAZOLE 5 MG PO TABS
5.0000 mg | ORAL_TABLET | Freq: Every day | ORAL | Status: DC
  Administered 2018-02-19: 5 mg via ORAL
  Filled 2018-02-19: qty 1

## 2018-02-19 MED ORDER — AMLODIPINE BESYLATE 5 MG PO TABS
10.0000 mg | ORAL_TABLET | Freq: Every day | ORAL | Status: DC
  Administered 2018-02-19: 10 mg via ORAL
  Filled 2018-02-19: qty 2

## 2018-02-19 NOTE — ED Notes (Signed)
Report given to Jackelyn Poling, Therapist, sports. Care transferred at this time. Security at bedside to wand patient, patient being transported to room 28 at this time.

## 2018-02-19 NOTE — ED Notes (Signed)
Pt is incredibly agitated because he was told his food would be brought back to his new room. Instead his food got thrown away. Pt was offered ravioli. Initially pt refused. However, he was able to calm himself down and accepted food.

## 2018-02-19 NOTE — ED Triage Notes (Addendum)
Pt arrives POV complaining of chest pain, shortness of breath. Pt states "I have been going through some stuff.I was thinking about hurting my self" Pt reports that the chest pain and shortness of breath began 2 hours ago and is associated with these stressful events. Pt endorsing SI after triage began. Pt reports that he used cocaine in the last hour
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
## 2018-02-19 NOTE — ED Notes (Signed)
Bed: XTA56 Expected date:  Expected time:  Means of arrival:  Comments: 37

## 2018-02-19 NOTE — ED Notes (Signed)
Pt oriented to unit and cooperative at this time. Pt states this morning he fully intended to end his life, but a friend offered him a ride here which "changed his outlook enough". Pt contracts for safety. Will continue to monitor.
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
## 2018-02-19 NOTE — ED Notes (Signed)
Pt being wanded by security prior to going to [facility].  Pt is calm and cooperative and ambulating steadily w/o complaints.

## 2018-02-20 ENCOUNTER — Ambulatory Visit (HOSPITAL_COMMUNITY)
Admission: RE | Admit: 2018-02-20 | Discharge: 2018-02-20 | Disposition: A | Discharge status: Home / Self Care | Payer: Federal, State, Local not specified - Other | Attending: Psychiatry | Admitting: Psychiatry

## 2018-02-20 ENCOUNTER — Encounter (HOSPITAL_COMMUNITY): Payer: Self-pay | Admitting: Behavioral Health

## 2018-02-20 DIAGNOSIS — R45851 Suicidal ideations: Secondary | ICD-10-CM

## 2018-02-20 DIAGNOSIS — F1494 Cocaine use, unspecified with cocaine-induced mood disorder: Secondary | ICD-10-CM

## 2018-02-20 NOTE — ED Notes (Signed)
Pt d/c home per MD order. Pt irritable, verbally aggressive. Security present. Discharge summary reviewed, pt uninterested, resources and bus pass provided. Pt ambulatory off unit. Personal property returned.

## 2018-02-20 NOTE — Progress Notes (Signed)
CSW aware patient is psychiatrically cleared for discharge. CSW provided homeless shelter resources and bus pass.  CSW signing off.  Stephanie Acre, [place] Social Worker [phone]
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
## 2018-02-20 NOTE — H&P (Signed)
Behavioral Health Medical Screening Exam  Nicholas Mccarty is an 47 y.o. male.  Patient presented as a walk-in and originally reported having suicidal ideations.  However, patient then stated his main goal [facility].  [facility] and they have no beds available for walk-ins tomorrow morning at 0 [phone].  Patient then denied having any suicidal or homicidal ideations and stated that he would prefer to go there for tomorrow morning as a walk-in to get a bed. Patient was given a bus pass to assist with transportation to [facility] residential.  Total Time spent with patient: 20 minutes  Psychiatric Specialty Exam: Physical Exam  Nursing note and vitals reviewed. Constitutional: He is oriented to person, place, and time. He appears well-developed and well-nourished.  Cardiovascular: Normal rate.  Respiratory: Effort normal.  Musculoskeletal: Normal range of motion.  Neurological: He is alert and oriented to person, place, and time.  Skin: Skin is warm.    Review of Systems  Constitutional: Negative.   HENT: Negative.   Eyes: Negative.   Respiratory: Negative.   Cardiovascular: Negative.   Gastrointestinal: Negative.   Genitourinary: Negative.   Musculoskeletal: Negative.   Skin: Negative.   Neurological: Negative.   Endo/Heme/Allergies: Negative.   Psychiatric/Behavioral: Positive for depression and substance abuse.    Blood pressure (!) 132/98, pulse 77, temperature 98.5 F (36.9 C), resp. rate 20.There is no height or weight on file to calculate BMI.  General Appearance: Casual  Eye Contact:  Fair  Speech:  Clear and Coherent and Normal Rate  Volume:  Decreased  Mood:  Depressed  Affect:  Congruent  Thought Process:  Coherent and Descriptions of Associations: Intact  Orientation:  Full (Time, Place, and Person)  Thought Content:  WDL  Suicidal Thoughts:  No  Homicidal Thoughts:  No  Memory:  Immediate;   Good Recent;    Good Remote;   Good  Judgement:  Fair  Insight:  Fair  Psychomotor Activity:  Normal  Concentration: Concentration: Good and Attention Span: Good  Recall:  Good  Fund of Knowledge:Good  Language: Good  Akathisia:  No  Handed:  Right  AIMS (if indicated):     Assets:  Communication Skills Physical Health Resilience Transportation  Sleep:       Musculoskeletal: Strength & Muscle Tone: within normal limits Gait & Station: normal Patient leans: N/A  Blood pressure (!) 132/98, pulse 77, temperature 98.5 F (36.9 C), resp. rate 20.  Recommendations:  Based on my evaluation the patient does not appear to have an emergency medical condition.  Dennis Port, FNP 02/20/2018, 12:28 PM

## 2018-02-20 NOTE — ED Notes (Signed)
Pt rested so far this evening until recently when he ambulated without assistance to the restroom. No needs or concerns were expressed. No distress is noted. Will continue to monitor.

## 2018-03-04 ENCOUNTER — Other Ambulatory Visit (HOSPITAL_COMMUNITY): Payer: Self-pay | Admitting: Family

## 2018-03-10 MED FILL — !COLCRYS 0.6 MG TABLET: 0.6 MG | 15 days supply | Qty: 30 | Fill #1

## 2018-03-10 MED FILL — !VENTOLIN HFA INHALER: 108 (90 BAS | 25 days supply | Qty: 18 | Fill #1

## 2018-03-14 ENCOUNTER — Telehealth: Payer: Self-pay

## 2018-03-14 MED ORDER — CLONIDINE HCL 0.2 MG PO TABS: 0.2000 mg | ORAL_TABLET | Freq: Four times a day (QID) | ORAL | 0 refills | Status: DC | PRN

## 2018-03-14 MED FILL — cloNIDine HCL 0.2 MG TABS: 0.2 | 23 days supply | Qty: 90 | Fill #0

## 2018-03-14 NOTE — Telephone Encounter (Signed)
Medication sent to pharmacy  

## 2018-03-23 ENCOUNTER — Encounter: Payer: Self-pay | Admitting: Family Medicine

## 2018-03-23 ENCOUNTER — Ambulatory Visit (INDEPENDENT_AMBULATORY_CARE_PROVIDER_SITE_OTHER): Payer: Self-pay | Admitting: Family Medicine

## 2018-03-23 VITALS — BP 122/90 | Ht 74.0 in | Wt 230.0 lb

## 2018-03-23 DIAGNOSIS — F32A Depression, unspecified: Secondary | ICD-10-CM

## 2018-03-23 DIAGNOSIS — R0989 Other specified symptoms and signs involving the circulatory and respiratory systems: Secondary | ICD-10-CM

## 2018-03-23 DIAGNOSIS — I10 Essential (primary) hypertension: Secondary | ICD-10-CM

## 2018-03-23 DIAGNOSIS — Z0000 Encounter for general adult medical examination without abnormal findings: Secondary | ICD-10-CM

## 2018-03-23 DIAGNOSIS — M109 Gout, unspecified: Secondary | ICD-10-CM

## 2018-03-23 DIAGNOSIS — F419 Anxiety disorder, unspecified: Secondary | ICD-10-CM

## 2018-03-23 DIAGNOSIS — Z09 Encounter for follow-up examination after completed treatment for conditions other than malignant neoplasm: Secondary | ICD-10-CM

## 2018-03-23 DIAGNOSIS — Z131 Encounter for screening for diabetes mellitus: Secondary | ICD-10-CM

## 2018-03-23 DIAGNOSIS — J45909 Unspecified asthma, uncomplicated: Secondary | ICD-10-CM

## 2018-03-23 DIAGNOSIS — F329 Major depressive disorder, single episode, unspecified: Secondary | ICD-10-CM

## 2018-03-23 LAB — POCT URINALYSIS DIP (MANUAL ENTRY)
Bilirubin, UA: NEGATIVE
Blood, UA: NEGATIVE
Glucose, UA: NEGATIVE mg/dL
Ketones, POC UA: NEGATIVE mg/dL
Leukocytes, UA: NEGATIVE
Nitrite, UA: NEGATIVE
Protein Ur, POC: NEGATIVE mg/dL
Spec Grav, UA: 1.025 (ref 1.010–1.025)
Urobilinogen, UA: 0.2 E.U./dL
pH, UA: 6 (ref 5.0–8.0)

## 2018-03-23 LAB — POCT GLYCOSYLATED HEMOGLOBIN (HGB A1C): Hemoglobin A1C: 5.2 % (ref 4.0–5.6)

## 2018-03-23 MED ORDER — ALBUTEROL SULFATE (2.5 MG/3ML) 0.083% IN NEBU: 2.5000 mg | INHALATION_SOLUTION | Freq: Four times a day (QID) | RESPIRATORY_TRACT | 11 refills | Status: DC | PRN

## 2018-03-23 MED ORDER — CLONIDINE HCL 0.2 MG PO TABS: 0.2000 mg | ORAL_TABLET | Freq: Four times a day (QID) | ORAL | 0 refills | Status: DC | PRN

## 2018-03-23 MED FILL — ?CLONIDINE HCL 0.2 MG TABLE: 0.2 | 23 days supply | Qty: 90 | Fill #0

## 2018-03-23 MED FILL — ALBUTEROL SUL 2.5 MG/3 ML S: (2.5 MG/3ML | 23 days supply | Qty: 90 | Fill #0

## 2018-03-23 NOTE — Progress Notes (Signed)
Follow Up  Subjective:    Patient ID: Nicholas Mccarty, male    DOB: 1970-09-19, 48 y.o.   MRN: [id number]  Chief Complaint  Patient presents with  . Follow-up    Chronic condition   . Hand Pain    Thumb pain   HPI a Nicholas Mccarty is a 48 year old male with a past medical history of Thyroid Disease, Thyroid Cancer, Substance Abuse, Pneumothorax, Hypertension, Cocaine Abuse, Asthma, and Allergies. He is here today for follow up.   Current Status: Since his last ED visit on 02/19/2018 for Cocaine Induce Mood Disorder, Chest Pain, Suicidal Thoughts. Today he is doing well with no complaints. His anxiety is moderate today. He denies suicidal ideations, homicidal ideations, or auditory hallucinations.He continues to have gouty attacks and is currently out of [place]. He denies visual changes, chest pain, cough, shortness of breath, heart palpitations, and falls. He has occasionally headaches and dizziness with position changes. Denies severe headaches, confusion, seizures, double vision, and blurred vision, nausea and vomiting.  He denies fevers, chills, fatigue, recent infections, weight loss, and night sweats. No reports of GI problems such as diarrhea, and constipation. He has no reports of blood in stools, dysuria and hematuria. He denies pain today.   Review of Systems  Constitutional: Negative.   HENT: Negative.   Eyes: Negative.   Respiratory: Negative.   Cardiovascular: Negative.   Gastrointestinal: Negative.   Endocrine: Negative.   Genitourinary: Negative.   Musculoskeletal: Negative.   Skin: Negative.   Allergic/Immunologic: Negative.   Neurological: Positive for dizziness and headaches.  Hematological: Negative.   Psychiatric/Behavioral: Negative.    Objective:   Physical Exam Vitals signs and nursing note reviewed.  Constitutional:      Appearance: Normal appearance. He is normal weight.  HENT:     Head: Normocephalic and atraumatic.     Right Ear: External ear normal.     Left Ear: External ear normal.     Nose: Nose normal.     Mouth/Throat:     Mouth: Mucous membranes are moist.     Pharynx: Oropharynx is clear.  Eyes:     Conjunctiva/sclera: Conjunctivae normal.  Neck:     Musculoskeletal: Normal range of motion and neck supple.  Cardiovascular:     Rate and Rhythm: Normal rate and regular rhythm.     Pulses: Normal pulses.     Heart sounds: Normal heart sounds.  Pulmonary:     Effort: Pulmonary effort is normal.     Breath sounds: Normal breath sounds.  Abdominal:     General: Bowel sounds are normal.     Palpations: Abdomen is soft.  Musculoskeletal: Normal range of motion.  Skin:    General: Skin is warm and dry.     Capillary Refill: Capillary refill takes less than 2 seconds.  Neurological:     General: No focal deficit present.     Mental Status: He is alert and oriented to person, place, and time.  Psychiatric:        Mood and Affect: Mood normal.        Behavior: Behavior normal.        Thought Content: Thought content normal.        Judgment: Judgment normal.    Assessment & Plan:   1. Essential hypertension He will continue Amlodipine as prescribed. He will continue to decrease high sodium intake, excessive alcohol intake, increase potassium intake, smoking cessation, and increase physical activity of at least 30 minutes of  cardio activity daily. She will continue to follow Heart Healthy or DASH diet. - cloNIDine (CATAPRES) 0.2 MG tablet; Take 1 tablet (0.2 mg total) by mouth every 6 (six) hours as needed (sbp>140 or DBP>90).  Dispense: 90 tablet; Refill: 0  2. Mild asthma without complication, unspecified whether persistent - albuterol (PROVENTIL) (2.5 MG/3ML) 0.083% nebulizer solution; Take 3 mLs (2.5 mg total) by nebulization every 6 (six) hours as needed for wheezing or shortness of breath.  Dispense: 150 mL; Refill: 11  3. Acute gout of left hand, unspecified cause He is having increasing episodes of gouty attacks. We  will initiate Allopurinol daily today. he will continue to use Colchicine as needed.  - allopurinol (ZYLOPRIM) 100 MG tablet; Take 1 tablet (100 mg total) by mouth daily.  Dispense: 30 tablet; Refill: 6  4. Anxiety and depression He will contact office if he needs referral to Psychiatry. Monitor.   5. Chest congestion Stable.  - albuterol (PROVENTIL) (2.5 MG/3ML) 0.083% nebulizer solution; Take 3 mLs (2.5 mg total) by nebulization every 6 (six) hours as needed for wheezing or shortness of breath.  Dispense: 150 mL; Refill: 11  6. Healthcare maintenance - Ambulatory referral to Ophthalmology - Ambulatory referral to Dentistry  7. Screening for diabetes mellitus Hgb A1c is within normal range at 5.2 today.  He will continue to decrease foods/beverages high in sugars and carbs and follow Heart Healthy or DASH diet. Increase physical activity to at least 30 minutes cardio exercise daily.  - POCT urinalysis dipstick - POCT glycosylated hemoglobin (Hb A1C)  8. Follow up He will follow up in 3 months.   Meds ordered this encounter  Medications  . albuterol (PROVENTIL) (2.5 MG/3ML) 0.083% nebulizer solution    Sig: Take 3 mLs (2.5 mg total) by nebulization every 6 (six) hours as needed for wheezing or shortness of breath.    Dispense:  150 mL    Refill:  11  . cloNIDine (CATAPRES) 0.2 MG tablet    Sig: Take 1 tablet (0.2 mg total) by mouth every 6 (six) hours as needed (sbp>140 or DBP>90).    Dispense:  90 tablet    Refill:  0  . allopurinol (ZYLOPRIM) 100 MG tablet    Sig: Take 1 tablet (100 mg total) by mouth daily.    Dispense:  30 tablet    Refill:  [place],  MSN, [facility] Patient [place] [address] [place], [place] [address] [phone]

## 2018-03-23 NOTE — Patient Instructions (Signed)
Allopurinol tablets What is this medicine? ALLOPURINOL (al oh PURE i nole) reduces the amount of uric acid the body makes. It is used to treat the symptoms of gout. It is also used to treat or prevent high uric acid levels that occur as a result of certain types of chemotherapy. This medicine may also help patients who frequently have kidney stones. This medicine may be used for other purposes; ask your health care provider or pharmacist if you have questions. COMMON BRAND NAME(S): Zyloprim What should I tell my health care provider before I take this medicine? They need to know if you have any of these conditions: -kidney disease -liver disease -an unusual or allergic reaction to allopurinol, other medicines, foods, dyes, or preservatives -pregnant or trying to get pregnant -breast feeding How should I use this medicine? Take this medicine by mouth with a glass of water. Follow the directions on the prescription label. If this medicine upsets your stomach, take it with food or milk. Take your doses at regular intervals. Do not take your medicine more often than directed. Talk to your pediatrician regarding the use of this medicine in children. Special care may be needed. While this drug may be prescribed for children as young as 6 years for selected conditions, precautions do apply. Overdosage: If you think you have taken too much of this medicine contact a poison control center or emergency room at once. NOTE: This medicine is only for you. Do not share this medicine with others. What if I miss a dose? If you miss a dose, take it as soon as you can. If it is almost time for your next dose, take only that dose. Do not take double or extra doses. What may interact with this medicine? Do not take this medicine with the following medication: -didanosine, ddI This medicine may also interact with the following medications: -certain antibiotics like amoxicillin, ampicillin -certain medicines for  cancer -certain medicines for immunosuppression like azathioprine, cyclosporine, mercaptopurine -chlorpropamide -probenecid -thiazide diuretics, like hydrochlorothiazide -sulfinpyrazone -warfarin This list may not describe all possible interactions. Give your health care provider a list of all the medicines, herbs, non-prescription drugs, or dietary supplements you use. Also tell them if you smoke, drink alcohol, or use illegal drugs. Some items may interact with your medicine. What should I watch for while using this medicine? Visit your doctor or health care professional for regular checks on your progress. If you are taking this medicine to treat gout, you may not have less frequent attacks at first. Keep taking your medicine regularly and the attacks should get better within 2 to 6 weeks. Drink plenty of water (10 to 12 full glasses a day) while you are taking this medicine. This will help to reduce stomach upset and reduce the risk of getting gout or kidney stones. Call your doctor or health care professional at once if you get a skin rash together with chills, fever, sore throat, or nausea and vomiting, if you have blood in your urine, or difficulty passing urine. Do not take vitamin C without asking your doctor or health care professional. Too much vitamin C can increase the chance of getting kidney stones. You may get drowsy or dizzy. Do not drive, use machinery, or do anything that needs mental alertness until you know how this drug affects you. Do not stand or sit up quickly, especially if you are an older patient. This reduces the risk of dizzy or fainting spells. Alcohol can make you more drowsy and  dizzy. Alcohol can also increase the chance of stomach problems and increase the amount of uric acid in your blood. Avoid alcoholic drinks. What side effects may I notice from receiving this medicine? Side effects that you should report to your doctor or health care professional as soon as  possible: -allergic reactions like skin rash, itching or hives, swelling of the face, lips, or tongue -breathing problems -fever with rash, swollen lymph nodes, or swelling of the face -joint pain -muscle pain -redness, blistering, peeling or loosening of the skin, including inside the mouth -signs and symptoms of infection like fever or chills; cough; sore throat -signs and symptoms of kidney injury like trouble passing urine or change in the amount of urine, flank pain -tingling, numbness in the hands or feet -unusual bleeding or bruising -unusually weak or tired Side effects that usually do not require medical attention (report to your doctor or health care professional if they continue or are bothersome): -changes in taste -diarrhea -drowsiness -headache -nausea, vomiting -stomach upset This list may not describe all possible side effects. Call your doctor for medical advice about side effects. You may report side effects to FDA at 1-800-FDA-1088. Where should I keep my medicine? Keep out of the reach of children. Store at room temperature between 15 and 25 degrees C (59 and 77 degrees F). Protect from light and moisture. Throw away any unused medicine after the expiration date. NOTE: This sheet is a summary. It may not cover all possible information. If you have questions about this medicine, talk to your doctor, pharmacist, or health care provider.  2019 Elsevier/Gold Standard (2016-08-04 15:49:12) Gout is painful swelling of your joints. Gout is a type of arthritis. It is caused by having too much uric acid in your body. Uric acid is a chemical that is made when your body breaks down substances called purines. If your body has too much uric acid, sharp crystals can form and build up in your joints. This causes pain and swelling. Gout attacks can happen quickly and be very painful (acute gout). Over time, the attacks can affect more joints and happen more often (chronic gout). What  are the causes?  Too much uric acid in your blood. This can happen because: ? Your kidneys do not remove enough uric acid from your blood. ? Your body makes too much uric acid. ? You eat too many foods that are high in purines. These foods include organ meats, some seafood, and beer.  Trauma or stress. What increases the risk?  Having a family history of gout.  Being male and middle-aged.  Being male and having gone through menopause.  Being very overweight (obese).  Drinking alcohol, especially beer.  Not having enough water in the body (being dehydrated).  Losing weight too quickly.  Having an organ transplant.  Having lead poisoning.  Taking certain medicines.  Having kidney disease.  Having a skin condition called psoriasis. What are the signs or symptoms? An attack of acute gout usually happens in just one joint. The most common place is the big toe. Attacks often start at night. Other joints that may be affected include joints of the feet, ankle, knee, fingers, wrist, or elbow. Symptoms of an attack may include:  Very bad pain.  Warmth.  Swelling.  Stiffness.  Shiny, red, or purple skin.  Tenderness. The affected joint may be very painful to touch.  Chills and fever. Chronic gout may cause symptoms more often. More joints may be involved. You may also  have white or yellow lumps (tophi) on your hands or feet or in other areas near your joints. How is this treated?  Treatment for this condition has two phases: treating an acute attack and preventing future attacks.  Acute gout treatment may include: ? NSAIDs. ? Steroids. These are taken by mouth or injected into a joint. ? Colchicine. This medicine relieves pain and swelling. It can be given by mouth or through an IV tube.  Preventive treatment may include: ? Taking small doses of NSAIDs or colchicine daily. ? Using a medicine that reduces uric acid levels in your blood. ? Making changes to your  diet. You may need to see a food expert (dietitian) about what to eat and drink to prevent gout. Follow these instructions at home: During a gout attack   If told, put ice on the painful area: ? Put ice in a plastic bag. ? Place a towel between your skin and the bag. ? Leave the ice on for 20 minutes, 2-3 times a day.  Raise (elevate) the painful joint above the level of your heart as often as you can.  Rest the joint as much as possible. If the joint is in your leg, you may be given crutches.  Follow instructions from your doctor about what you cannot eat or drink. Avoiding future gout attacks  Eat a low-purine diet. Avoid foods and drinks such as: ? Liver. ? Kidney. ? Anchovies. ? Asparagus. ? Herring. ? Mushrooms. ? Mussels. ? Beer.  Stay at a healthy weight. If you want to lose weight, talk with your doctor. Do not lose weight too fast.  Start or continue an exercise plan as told by your doctor. Eating and drinking  Drink enough fluids to keep your pee (urine) pale yellow.  If you drink alcohol: ? Limit how much you use to:  0-1 drink a day for women.  0-2 drinks a day for men. ? Be aware of how much alcohol is in your drink. In the [place] one drink equals one 12 oz bottle of beer (355 mL), one 5 oz glass of wine (148 mL), or one 1 oz glass of hard liquor (44 mL). General instructions  Take over-the-counter and prescription medicines only as told by your doctor.  Do not drive or use heavy machinery while taking prescription pain medicine.  Return to your normal activities as told by your doctor. Ask your doctor what activities are safe for you.  Keep all follow-up visits as told by your doctor. This is important. Contact a doctor if:  You have another gout attack.  You still have symptoms of a gout attack after 10 days of treatment.  You have problems (side effects) because of your medicines.  You have chills or a fever.  You have burning pain when you  pee (urinate).  You have pain in your lower back or belly. Get help right away if:  You have very bad pain.  Your pain cannot be controlled.  You cannot pee. Summary  Gout is painful swelling of the joints.  The most common site of pain is the big toe, but it can affect other joints.  Medicines and avoiding some foods can help to prevent and treat gout attacks. This information is not intended to replace advice given to you by your health care provider. Make sure you discuss any questions you have with your health care provider. Document Released: 12/10/2007 Document Revised: 09/22/2017 Document Reviewed: 09/22/2017 Elsevier Interactive Patient Education  2019  [employer].

## 2018-03-24 MED ORDER — ALLOPURINOL 100 MG PO TABS: 100.0000 mg | ORAL_TABLET | Freq: Every day | ORAL | 6 refills | Status: DC

## 2018-03-24 MED FILL — ?ALLOPURINOL 100MG TABLET: 100 | 30 days supply | Qty: 30 | Fill #0

## 2018-03-24 MED FILL — !COLCRYS 0.6 MG TABLET: 0.6 MG | 15 days supply | Qty: 30 | Fill #2

## 2018-05-23 MED FILL — cloNIDine HCL 0.2 MG TABS: 0.2 | 23 days supply | Qty: 90 | Fill #0

## 2018-05-23 MED FILL — ALBUTEROL SUL 2.5 MG/3 ML S: (2.5 MG/3ML | 23 days supply | Qty: 90 | Fill #1

## 2018-05-23 MED FILL — !VENTOLIN HFA INHALER: 108 (90 BAS | 25 days supply | Qty: 18 | Fill #2

## 2018-05-25 ENCOUNTER — Telehealth: Payer: Self-pay

## 2018-05-25 NOTE — Telephone Encounter (Signed)
Patient needs a order for a nebulizer machine. Would like to have sent to [facility]

## 2018-05-26 NOTE — Telephone Encounter (Signed)
Order was faxed.

## 2018-06-21 ENCOUNTER — Telehealth: Payer: Self-pay

## 2018-06-21 ENCOUNTER — Encounter (HOSPITAL_BASED_OUTPATIENT_CLINIC_OR_DEPARTMENT_OTHER): Payer: Self-pay | Admitting: Emergency Medicine

## 2018-06-21 ENCOUNTER — Emergency Department (HOSPITAL_BASED_OUTPATIENT_CLINIC_OR_DEPARTMENT_OTHER)
Admission: EM | Admit: 2018-06-21 | Discharge: 2018-06-21 | Disposition: A | Discharge status: Home / Self Care | Payer: Self-pay | Attending: Emergency Medicine | Admitting: Emergency Medicine

## 2018-06-21 ENCOUNTER — Other Ambulatory Visit: Payer: Self-pay

## 2018-06-21 DIAGNOSIS — J45909 Unspecified asthma, uncomplicated: Secondary | ICD-10-CM

## 2018-06-21 DIAGNOSIS — R0989 Other specified symptoms and signs involving the circulatory and respiratory systems: Secondary | ICD-10-CM

## 2018-06-21 DIAGNOSIS — I10 Essential (primary) hypertension: Secondary | ICD-10-CM | POA: Insufficient documentation

## 2018-06-21 DIAGNOSIS — W3189XA Contact with other specified machinery, initial encounter: Secondary | ICD-10-CM | POA: Insufficient documentation

## 2018-06-21 DIAGNOSIS — Z77098 Contact with and (suspected) exposure to other hazardous, chiefly nonmedicinal, chemicals: Secondary | ICD-10-CM

## 2018-06-21 DIAGNOSIS — Y999 Unspecified external cause status: Secondary | ICD-10-CM | POA: Insufficient documentation

## 2018-06-21 DIAGNOSIS — T65891A Toxic effect of other specified substances, accidental (unintentional), initial encounter: Secondary | ICD-10-CM | POA: Insufficient documentation

## 2018-06-21 DIAGNOSIS — F17210 Nicotine dependence, cigarettes, uncomplicated: Secondary | ICD-10-CM | POA: Insufficient documentation

## 2018-06-21 DIAGNOSIS — Y9389 Activity, other specified: Secondary | ICD-10-CM | POA: Insufficient documentation

## 2018-06-21 DIAGNOSIS — Y929 Unspecified place or not applicable: Secondary | ICD-10-CM | POA: Insufficient documentation

## 2018-06-21 MED ORDER — TETRACAINE HCL 0.5 % OP SOLN
OPHTHALMIC | Status: AC
  Filled 2018-06-21: qty 4

## 2018-06-21 MED ORDER — ERYTHROMYCIN 5 MG/GM OP OINT: TOPICAL_OINTMENT | OPHTHALMIC | 0 refills | Status: DC

## 2018-06-21 MED ORDER — TETRACAINE HCL 0.5 % OP SOLN
2.0000 [drp] | Freq: Once | OPHTHALMIC | Status: AC
  Administered 2018-06-21: 15:00:00 2 [drp] via OPHTHALMIC

## 2018-06-21 MED ORDER — FLUORESCEIN SODIUM 1 MG OP STRP
1.0000 | ORAL_STRIP | Freq: Once | OPHTHALMIC | Status: AC
  Administered 2018-06-21: 16:00:00 1 via OPHTHALMIC
  Filled 2018-06-21: qty 1

## 2018-06-21 MED FILL — ERYTHROMYCIN EYE OINTMENT: 5 | 7 days supply | Qty: 4 | Fill #0

## 2018-06-21 NOTE — Discharge Instructions (Signed)
Take tylenol and/or motrin for your eye pain. Use the ointment as instructed. Call the eye doctor on call for an follow up visit in the next 48 hours. Return to the ED with any new or worsening symptoms.

## 2018-06-21 NOTE — ED Notes (Signed)
PH OD 7.5 MD aware. Completed irrigation of both eyes , 2 Liters total. Pt able to see.

## 2018-06-21 NOTE — ED Triage Notes (Addendum)
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  Unsure what chemicals were used.  C/o burning to bilateral eyes.  PH test strips-6.0

## 2018-06-21 NOTE — ED Notes (Signed)
Per pt's wife chemical that pt was using is Dance movement psychotherapist

## 2018-06-21 NOTE — ED Notes (Signed)
Pt remains remains with constant eye irrigation at this time.
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
## 2018-06-22 ENCOUNTER — Ambulatory Visit: Payer: Self-pay | Admitting: Family Medicine

## 2018-06-23 MED ORDER — ALBUTEROL SULFATE HFA 108 (90 BASE) MCG/ACT IN AERS: 1.0000 | INHALATION_SPRAY | Freq: Four times a day (QID) | RESPIRATORY_TRACT | 11 refills | Status: DC | PRN

## 2018-06-23 MED ORDER — ALBUTEROL SULFATE (2.5 MG/3ML) 0.083% IN NEBU: 2.5000 mg | INHALATION_SOLUTION | Freq: Four times a day (QID) | RESPIRATORY_TRACT | 11 refills | Status: DC | PRN

## 2018-06-23 MED FILL — !VENTOLIN HFA INHALER: 108 (90 BAS | 25 days supply | Qty: 18 | Fill #0

## 2018-06-23 MED FILL — ALBUTEROL SUL 2.5 MG/3 ML S: (2.5 MG/3ML | 12 days supply | Qty: 150 | Fill #0

## 2018-06-23 NOTE — Telephone Encounter (Signed)
Medication sent to pharmacy again

## 2018-07-22 ENCOUNTER — Other Ambulatory Visit: Payer: Self-pay

## 2018-07-22 ENCOUNTER — Ambulatory Visit (INDEPENDENT_AMBULATORY_CARE_PROVIDER_SITE_OTHER): Payer: Self-pay | Admitting: Family Medicine

## 2018-07-22 ENCOUNTER — Encounter: Payer: Self-pay | Admitting: Family Medicine

## 2018-07-22 VITALS — BP 156/92 | HR 88 | Temp 98.2°F | Ht 74.0 in | Wt 229.0 lb

## 2018-07-22 DIAGNOSIS — M109 Gout, unspecified: Secondary | ICD-10-CM

## 2018-07-22 DIAGNOSIS — E039 Hypothyroidism, unspecified: Secondary | ICD-10-CM

## 2018-07-22 DIAGNOSIS — I10 Essential (primary) hypertension: Secondary | ICD-10-CM

## 2018-07-22 DIAGNOSIS — Z09 Encounter for follow-up examination after completed treatment for conditions other than malignant neoplasm: Secondary | ICD-10-CM

## 2018-07-22 DIAGNOSIS — J45909 Unspecified asthma, uncomplicated: Secondary | ICD-10-CM

## 2018-07-22 LAB — POCT URINALYSIS DIP (MANUAL ENTRY)
Bilirubin, UA: NEGATIVE
Blood, UA: NEGATIVE
Glucose, UA: NEGATIVE mg/dL
Ketones, POC UA: NEGATIVE mg/dL
Leukocytes, UA: NEGATIVE
Nitrite, UA: NEGATIVE
Protein Ur, POC: NEGATIVE mg/dL
Spec Grav, UA: 1.02 (ref 1.010–1.025)
Urobilinogen, UA: 0.2 E.U./dL
pH, UA: 7.5 (ref 5.0–8.0)

## 2018-07-22 MED ORDER — LORATADINE 10 MG PO TABS: 10.0000 mg | ORAL_TABLET | Freq: Every day | ORAL | 11 refills | Status: DC

## 2018-07-22 MED ORDER — ALLOPURINOL 100 MG PO TABS: 100.0000 mg | ORAL_TABLET | Freq: Every day | ORAL | 6 refills | Status: AC

## 2018-07-22 MED ORDER — MONTELUKAST SODIUM 10 MG PO TABS: 10.0000 mg | ORAL_TABLET | Freq: Every day | ORAL | 3 refills | Status: DC

## 2018-07-22 MED ORDER — FLUTICASONE PROPIONATE 50 MCG/ACT NA SUSP: 2.0000 | Freq: Every day | NASAL | 6 refills | Status: DC

## 2018-07-22 MED ORDER — COLCHICINE 0.6 MG PO TABS: 0.6000 mg | ORAL_TABLET | Freq: Two times a day (BID) | ORAL | 3 refills | Status: AC

## 2018-07-22 MED ORDER — LEVOTHYROXINE SODIUM 150 MCG PO TABS: 150.0000 ug | ORAL_TABLET | Freq: Every day | ORAL | 3 refills | Status: DC

## 2018-07-22 MED ORDER — CLONIDINE HCL 0.2 MG PO TABS: 0.2000 mg | ORAL_TABLET | Freq: Four times a day (QID) | ORAL | 3 refills | Status: AC | PRN

## 2018-07-22 MED ORDER — TRAZODONE HCL 150 MG PO TABS: 150.0000 mg | ORAL_TABLET | Freq: Every evening | ORAL | 0 refills | Status: DC | PRN

## 2018-07-22 MED ORDER — FAMOTIDINE 20 MG PO TABS: 20.0000 mg | ORAL_TABLET | Freq: Two times a day (BID) | ORAL | 3 refills | Status: DC

## 2018-07-22 MED ORDER — AMLODIPINE BESYLATE 10 MG PO TABS: 10.0000 mg | ORAL_TABLET | Freq: Every day | ORAL | 6 refills | Status: AC

## 2018-07-22 MED FILL — ?AMLODIPINE BESYLATE 10 MG: 10 | 30 days supply | Qty: 30 | Fill #0

## 2018-07-22 MED FILL — traZODone HCL 150 MG TABS: 150 | 30 days supply | Qty: 30 | Fill #0

## 2018-07-22 MED FILL — ?COLCHICINE 0.6 MG CAPS: 0.6 | 24 days supply | Qty: 48 | Fill #0

## 2018-07-22 MED FILL — FLUTICASONE PROP 50 MCG SPR: 50 | 30 days supply | Qty: 16 | Fill #0

## 2018-07-22 MED FILL — ?ALLOPURINOL 100MG TABLET: 100 | 30 days supply | Qty: 30 | Fill #0

## 2018-07-22 MED FILL — cloNIDine HCL 0.2 MG TABS: 0.2 | 23 days supply | Qty: 90 | Fill #0

## 2018-07-22 MED FILL — MONTELUKAST SOD 10 MG TAB: 10 | 30 days supply | Qty: 30 | Fill #0

## 2018-07-22 MED FILL — ?LEVOTHYROXINE 150 MCG TAB: 150 | 30 days supply | Qty: 30 | Fill #0

## 2018-07-22 NOTE — Patient Instructions (Addendum)
Gastroesophageal Reflux Disease, Adult Gastroesophageal reflux (GER) happens when acid from the stomach flows up into the tube that connects the mouth and the stomach (esophagus). Normally, food travels down the esophagus and stays in the stomach to be digested. With GER, food and stomach acid sometimes move back up into the esophagus. You may have a disease called gastroesophageal reflux disease (GERD) if the reflux:  Happens often.  Causes frequent or very bad symptoms.  Causes problems such as damage to the esophagus. When this happens, the esophagus becomes sore and swollen (inflamed). Over time, GERD can make small holes (ulcers) in the lining of the esophagus. What are the causes? This condition is caused by a problem with the muscle between the esophagus and the stomach. When this muscle is weak or not normal, it does not close properly to keep food and acid from coming back up from the stomach. The muscle can be weak because of:  Tobacco use.  Pregnancy.  Having a certain type of hernia (hiatal hernia).  Alcohol use.  Certain foods and drinks, such as coffee, chocolate, onions, and peppermint. What increases the risk? You are more likely to develop this condition if you:  Are overweight.  Have a disease that affects your connective tissue.  Use NSAID medicines. What are the signs or symptoms? Symptoms of this condition include:  Heartburn.  Difficult or painful swallowing.  The feeling of having a lump in the throat.  A bitter taste in the mouth.  Bad breath.  Having a lot of saliva.  Having an upset or bloated stomach.  Belching.  Chest pain. Different conditions can cause chest pain. Make sure you see your doctor if you have chest pain.  Shortness of breath or noisy breathing (wheezing).  Ongoing (chronic) cough or a cough at night.  Wearing away of the surface of teeth (tooth enamel).  Weight loss. How is this treated? Treatment will depend on how  bad your symptoms are. Your doctor may suggest:  Changes to your diet.  Medicine.  Surgery. Follow these instructions at home: Eating and drinking   Follow a diet as told by your doctor. You may need to avoid foods and drinks such as: ? Coffee and tea (with or without caffeine). ? Drinks that contain alcohol. ? Energy drinks and sports drinks. ? Bubbly (carbonated) drinks or sodas. ? Chocolate and cocoa. ? Peppermint and mint flavorings. ? Garlic and onions. ? Horseradish. ? Spicy and acidic foods. These include peppers, chili powder, curry powder, vinegar, hot sauces, and BBQ sauce. ? Citrus fruit juices and citrus fruits, such as oranges, lemons, and limes. ? Tomato-based foods. These include red sauce, chili, salsa, and pizza with red sauce. ? Fried and fatty foods. These include donuts, french fries, potato chips, and high-fat dressings. ? High-fat meats. These include hot dogs, rib eye steak, sausage, ham, and bacon. ? High-fat dairy items, such as whole milk, butter, and cream cheese.  Eat small meals often. Avoid eating large meals.  Avoid drinking large amounts of liquid with your meals.  Avoid eating meals during the 2-3 hours before bedtime.  Avoid lying down right after you eat.  Do not exercise right after you eat. Lifestyle   Do not use any products that contain nicotine or tobacco. These include cigarettes, e-cigarettes, and chewing tobacco. If you need help quitting, ask your doctor.  Try to lower your stress. If you need help doing this, ask your doctor.  If you are overweight, lose an amount   of weight that is healthy for you. Ask your doctor about a safe weight loss goal. General instructions  Pay attention to any changes in your symptoms.  Take over-the-counter and prescription medicines only as told by your doctor. Do not take aspirin, ibuprofen, or other NSAIDs unless your doctor says it is okay.  Wear loose clothes. Do not wear anything tight  around your waist.  Raise (elevate) the head of your bed about 6 inches (15 cm).  Avoid bending over if this makes your symptoms worse.  Keep all follow-up visits as told by your doctor. This is important. Contact a doctor if:  You have new symptoms.  You lose weight and you do not know why.  You have trouble swallowing or it hurts to swallow.  You have wheezing or a cough that keeps happening.  Your symptoms do not get better with treatment.  You have a hoarse voice. Get help right away if:  You have pain in your arms, neck, jaw, teeth, or back.  You feel sweaty, dizzy, or light-headed.  You have chest pain or shortness of breath.  You throw up (vomit) and your throw-up looks like blood or coffee grounds.  You pass out (faint).  Your poop (stool) is bloody or black.  You cannot swallow, drink, or eat. Summary  If a person has gastroesophageal reflux disease (GERD), food and stomach acid move back up into the esophagus and cause symptoms or problems such as damage to the esophagus.  Treatment will depend on how bad your symptoms are.  Follow a diet as told by your doctor.  Take all medicines only as told by your doctor. This information is not intended to replace advice given to you by your health care provider. Make sure you discuss any questions you have with your health care provider. Document Released: 08/19/2007 Document Revised: 09/08/2017 Document Reviewed: 09/08/2017 Elsevier Interactive Patient Education  2019 [place]. Loratadine capsules or tablets What is this medicine? LORATADINE (lor AT a deen) is an antihistamine. It helps to relieve sneezing, runny nose, and itchy, watery eyes. This medicine is used to treat the symptoms of allergies. It is also used to treat itchy skin rash and hives. This medicine may be used for other purposes; ask your health care provider or pharmacist if you have questions. COMMON BRAND NAME(S): Alavert, Allergy Relief,  Claritin, Claritin Hives Relief, Claritin Liqui-Gel, Claritin-D 24 Hour, Clear-Atadine, QlearQuil All Day & All Night Allergy Relief, Tavist ND What should I tell my health care provider before I take this medicine? They need to know if you have any of these conditions: -asthma -kidney disease -liver disease -an unusual or allergic reaction to loratadine, other antihistamines, other medicines, foods, dyes, or preservatives -pregnant or trying to get pregnant -breast-feeding How should I use this medicine? Take this medicine by mouth with a glass of water. Follow the directions on the label. You may take this medicine with food or on an empty stomach. Take your medicine at regular intervals. Do not take your medicine more often than directed. Talk to your pediatrician regarding the use of this medicine in children. While this medicine may be used in children as young as 6 years for selected conditions, precautions do apply. Overdosage: If you think you have taken too much of this medicine contact a poison control center or emergency room at once. NOTE: This medicine is only for you. Do not share this medicine with others. What if I miss a dose? If you miss  a dose, take it as soon as you can. If it is almost time for your next dose, take only that dose. Do not take double or extra doses. What may interact with this medicine? -other medicines for colds or allergies This list may not describe all possible interactions. Give your health care provider a list of all the medicines, herbs, non-prescription drugs, or dietary supplements you use. Also tell them if you smoke, drink alcohol, or use illegal drugs. Some items may interact with your medicine. What should I watch for while using this medicine? Tell your doctor or healthcare professional if your symptoms do not start to get better or if they get worse. Your mouth may get dry. Chewing sugarless gum or sucking hard candy, and drinking plenty of  water may help. Contact your doctor if the problem does not go away or is severe. You may get drowsy or dizzy. Do not drive, use machinery, or do anything that needs mental alertness until you know how this medicine affects you. Do not stand or sit up quickly, especially if you are an older patient. This reduces the risk of dizzy or fainting spells. What side effects may I notice from receiving this medicine? Side effects that you should report to your doctor or health care professional as soon as possible: -allergic reactions like skin rash, itching or hives, swelling of the face, lips, or tongue -breathing problems -unusually restless or nervous Side effects that usually do not require medical attention (report to your doctor or health care professional if they continue or are bothersome): -drowsiness -dry or irritated mouth or throat -headache This list may not describe all possible side effects. Call your doctor for medical advice about side effects. You may report side effects to FDA at 1-800-FDA-1088. Where should I keep my medicine? Keep out of the reach of children. Store at room temperature between 2 and 30 degrees C (36 and 86 degrees F). Protect from moisture. Throw away any unused medicine after the expiration date. NOTE: This sheet is a summary. It may not cover all possible information. If you have questions about this medicine, talk to your doctor, pharmacist, or health care provider.  2019 Elsevier/Gold Standard (2007-09-05 17:17:24) Fluticasone nasal spray What is this medicine? FLUTICASONE (floo TIK a sone) is a corticosteroid. This medicine is used to treat the symptoms of allergies like sneezing, itchy red eyes, and itchy, runny, or stuffy nose. This medicine is also used to treat nasal polyps. This medicine may be used for other purposes; ask your health care provider or pharmacist if you have questions. COMMON BRAND NAME(S): ClariSpray, Flonase, Flonase Allergy Relief,  Flonase Sensimist, Veramyst, XHANCE What should I tell my health care provider before I take this medicine? They need to know if you have any of these conditions: -eye disease, vision problems -infection, like tuberculosis, herpes, or fungal infection -recent surgery on nose or sinuses -taking a corticosteroid by mouth -an unusual or allergic reaction to fluticasone, steroids, other medicines, foods, dyes, or preservatives -pregnant or trying to get pregnant -breast-feeding How should I use this medicine? This medicine is for use in the nose. Follow the directions on your product or prescription label. This medicine works best if used at regular intervals. Do not use more often than directed. Make sure that you are using your nasal spray correctly. After 6 months of daily use for allergies, talk to your doctor or health care professional before using it for a longer time. Ask your doctor or health  care professional if you have any questions. Talk to your pediatrician regarding the use of this medicine in children. Special care may be needed. Some products have been used for allergies in children as young as 2 years. After 2 months of daily use without a prescription in a child, talk to your pediatrician before using it for a longer time. Use of this medicine for nasal polyps is not approved in children. Overdosage: If you think you have taken too much of this medicine contact a poison control center or emergency room at once. NOTE: This medicine is only for you. Do not share this medicine with others. What if I miss a dose? If you miss a dose, use it as soon as you remember. If it is almost time for your next dose, use only that dose and continue with your regular schedule. Do not use double or extra doses. What may interact with this medicine? -certain antibiotics like clarithromycin and telithromycin -certain medicines for fungal infections like ketoconazole, itraconazole, and voriconazole  -conivaptan -nefazodone -some medicines for HIV -vaccines This list may not describe all possible interactions. Give your health care provider a list of all the medicines, herbs, non-prescription drugs, or dietary supplements you use. Also tell them if you smoke, drink alcohol, or use illegal drugs. Some items may interact with your medicine. What should I watch for while using this medicine? Visit your healthcare professional for regular checks on your progress. Tell your healthcare professional if your symptoms do not start to get better or if they get worse. This medicine may increase your risk of getting an infection. Tell your doctor or health care professional if you are around anyone with measles or chickenpox, or if you develop sores or blisters that do not heal properly. What side effects may I notice from receiving this medicine? Side effects that you should report to your doctor or health care professional as soon as possible: -allergic reactions like skin rash, itching or hives, swelling of the face, lips, or tongue -changes in vision -crusting or sores in the nose -nosebleed -signs and symptoms of infection like fever or chills; cough; sore throat -white patches or sores in the mouth or nose Side effects that usually do not require medical attention (report to your doctor or health care professional if they continue or are bothersome): -burning or irritation inside the nose or throat -changes in taste or smell -cough -headache This list may not describe all possible side effects. Call your doctor for medical advice about side effects. You may report side effects to FDA at 1-800-FDA-1088. Where should I keep my medicine? Keep out of the reach of children. Store at room temperature between 15 and 30 degrees C (59 and 86 degrees F). Avoid exposure to extreme heat, cold, or light. Throw away any unused medicine after the expiration date. NOTE: This sheet is a summary. It may not  cover all possible information. If you have questions about this medicine, talk to your doctor, pharmacist, or health care provider.  2019 Elsevier/Gold Standard (2017-03-25 14:10:08) Allergies, Adult An allergy means that your body reacts to something that bothers it (allergen). It is not a normal reaction. This can happen from something that you:  Eat.  Breathe in.  Touch. You can have an allergy (be allergic) to:  Outdoor things, like: ? Pollen. ? Grass. ? Weeds.  Indoor things, like: ? Dust. ? Smoke. ? Pet dander.  Foods.  Medicines.  Things that bother your skin, like: ? Detergents. ?  Chemicals. ? Latex.  Perfume.  Bugs. An allergy cannot spread from person to person (is not contagious). Follow these instructions at home:         Stay away from things that you know you are allergic to.  If you have allergies to things in the air, wash out your nose each day. Do it with one of these: ? A salt-water (saline) spray. ? A container (neti pot).  Take over-the-counter and prescription medicines only as told by your doctor.  Keep all follow-up visits as told by your doctor. This is important.  If you are at risk for a very bad allergy reaction (anaphylaxis), keep an auto-injector with you all the time. This is called an epinephrine injection. ? This is pre-measured medicine with a needle. You can put it into your skin by yourself. ? Right after you have a very bad allergy reaction, you or a person with you must give the medicine in less than a few minutes. This is an emergency.  If you have ever had a very bad allergy reaction, wear a medical alert bracelet or necklace. Your very bad allergy should be written on it. Contact a health care provider if:  Your symptoms do not get better with treatment. Get help right away if:  You have symptoms of a very bad allergy reaction. These include: ? A swollen mouth, tongue, or throat. ? Pain or tightness in your  chest. ? Trouble breathing. ? Being short of breath. ? Dizziness. ? Fainting. ? Very bad pain in your belly (abdomen). ? Throwing up (vomiting). ? Watery poop (diarrhea). Summary  An allergy means that your body reacts to something that bothers it (allergen). It is not a normal reaction.  Stay away from things that make your body react.  Take over-the-counter and prescription medicines only as told by your doctor.  If you are at risk for a very bad allergy reaction, carry an auto-injector (epinephrine injection) all the time. Also, wear a medical alert bracelet or necklace so people know about your allergy. This information is not intended to replace advice given to you by your health care provider. Make sure you discuss any questions you have with your health care provider. Document Released: 06/27/2012 Document Revised: 06/15/2016 Document Reviewed: 06/15/2016 Elsevier Interactive Patient Education  2019 [place]. Loratadine capsules or tablets What is this medicine? LORATADINE (lor AT a deen) is an antihistamine. It helps to relieve sneezing, runny nose, and itchy, watery eyes. This medicine is used to treat the symptoms of allergies. It is also used to treat itchy skin rash and hives. This medicine may be used for other purposes; ask your health care provider or pharmacist if you have questions. COMMON BRAND NAME(S): Alavert, Allergy Relief, Claritin, Claritin Hives Relief, Claritin Liqui-Gel, Claritin-D 24 Hour, Clear-Atadine, QlearQuil All Day & All Night Allergy Relief, Tavist ND What should I tell my health care provider before I take this medicine? They need to know if you have any of these conditions: -asthma -kidney disease -liver disease -an unusual or allergic reaction to loratadine, other antihistamines, other medicines, foods, dyes, or preservatives -pregnant or trying to get pregnant -breast-feeding How should I use this medicine? Take this medicine by mouth  with a glass of water. Follow the directions on the label. You may take this medicine with food or on an empty stomach. Take your medicine at regular intervals. Do not take your medicine more often than directed. Talk to your pediatrician regarding the use of  this medicine in children. While this medicine may be used in children as young as 6 years for selected conditions, precautions do apply. Overdosage: If you think you have taken too much of this medicine contact a poison control center or emergency room at once. NOTE: This medicine is only for you. Do not share this medicine with others. What if I miss a dose? If you miss a dose, take it as soon as you can. If it is almost time for your next dose, take only that dose. Do not take double or extra doses. What may interact with this medicine? -other medicines for colds or allergies This list may not describe all possible interactions. Give your health care provider a list of all the medicines, herbs, non-prescription drugs, or dietary supplements you use. Also tell them if you smoke, drink alcohol, or use illegal drugs. Some items may interact with your medicine. What should I watch for while using this medicine? Tell your doctor or healthcare professional if your symptoms do not start to get better or if they get worse. Your mouth may get dry. Chewing sugarless gum or sucking hard candy, and drinking plenty of water may help. Contact your doctor if the problem does not go away or is severe. You may get drowsy or dizzy. Do not drive, use machinery, or do anything that needs mental alertness until you know how this medicine affects you. Do not stand or sit up quickly, especially if you are an older patient. This reduces the risk of dizzy or fainting spells. What side effects may I notice from receiving this medicine? Side effects that you should report to your doctor or health care professional as soon as possible: -allergic reactions like skin rash,  itching or hives, swelling of the face, lips, or tongue -breathing problems -unusually restless or nervous Side effects that usually do not require medical attention (report to your doctor or health care professional if they continue or are bothersome): -drowsiness -dry or irritated mouth or throat -headache This list may not describe all possible side effects. Call your doctor for medical advice about side effects. You may report side effects to FDA at 1-800-FDA-1088. Where should I keep my medicine? Keep out of the reach of children. Store at room temperature between 2 and 30 degrees C (36 and 86 degrees F). Protect from moisture. Throw away any unused medicine after the expiration date. NOTE: This sheet is a summary. It may not cover all possible information. If you have questions about this medicine, talk to your doctor, pharmacist, or health care provider.  2019 Elsevier/Gold Standard (2007-09-05 17:17:24)
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
## 2018-07-23 DIAGNOSIS — M109 Gout, unspecified: Secondary | ICD-10-CM | POA: Insufficient documentation

## 2018-07-23 DIAGNOSIS — I10 Essential (primary) hypertension: Secondary | ICD-10-CM | POA: Insufficient documentation

## 2018-07-23 DIAGNOSIS — J45909 Unspecified asthma, uncomplicated: Secondary | ICD-10-CM | POA: Insufficient documentation

## 2018-08-29 ENCOUNTER — Inpatient Hospital Stay (HOSPITAL_BASED_OUTPATIENT_CLINIC_OR_DEPARTMENT_OTHER)
Admission: EM | Admit: 2018-08-29 | Discharge: 2018-09-06 | DRG: 165 | Disposition: A | Discharge status: Home / Self Care | Payer: Self-pay | Attending: Thoracic Surgery (Cardiothoracic Vascular Surgery) | Admitting: Thoracic Surgery (Cardiothoracic Vascular Surgery)

## 2018-08-29 ENCOUNTER — Inpatient Hospital Stay (HOSPITAL_COMMUNITY): Payer: Self-pay

## 2018-08-29 ENCOUNTER — Other Ambulatory Visit: Payer: Self-pay

## 2018-08-29 ENCOUNTER — Emergency Department (HOSPITAL_COMMUNITY): Payer: Self-pay

## 2018-08-29 ENCOUNTER — Encounter (HOSPITAL_BASED_OUTPATIENT_CLINIC_OR_DEPARTMENT_OTHER): Payer: Self-pay | Admitting: *Deleted

## 2018-08-29 ENCOUNTER — Emergency Department (HOSPITAL_BASED_OUTPATIENT_CLINIC_OR_DEPARTMENT_OTHER): Payer: Self-pay

## 2018-08-29 DIAGNOSIS — J45909 Unspecified asthma, uncomplicated: Secondary | ICD-10-CM | POA: Diagnosis present

## 2018-08-29 DIAGNOSIS — Z09 Encounter for follow-up examination after completed treatment for conditions other than malignant neoplasm: Secondary | ICD-10-CM

## 2018-08-29 DIAGNOSIS — J939 Pneumothorax, unspecified: Secondary | ICD-10-CM

## 2018-08-29 DIAGNOSIS — E890 Postprocedural hypothyroidism: Secondary | ICD-10-CM | POA: Diagnosis present

## 2018-08-29 DIAGNOSIS — I10 Essential (primary) hypertension: Secondary | ICD-10-CM | POA: Diagnosis present

## 2018-08-29 DIAGNOSIS — J9383 Other pneumothorax: Principal | ICD-10-CM | POA: Diagnosis present

## 2018-08-29 DIAGNOSIS — Z85850 Personal history of malignant neoplasm of thyroid: Secondary | ICD-10-CM

## 2018-08-29 DIAGNOSIS — R079 Chest pain, unspecified: Secondary | ICD-10-CM

## 2018-08-29 DIAGNOSIS — M109 Gout, unspecified: Secondary | ICD-10-CM | POA: Diagnosis present

## 2018-08-29 DIAGNOSIS — K219 Gastro-esophageal reflux disease without esophagitis: Secondary | ICD-10-CM | POA: Diagnosis present

## 2018-08-29 DIAGNOSIS — F17210 Nicotine dependence, cigarettes, uncomplicated: Secondary | ICD-10-CM | POA: Diagnosis present

## 2018-08-29 DIAGNOSIS — Z20828 Contact with and (suspected) exposure to other viral communicable diseases: Secondary | ICD-10-CM | POA: Diagnosis present

## 2018-08-29 HISTORY — PX: CHEST TUBE INSERTION: SHX231

## 2018-08-29 LAB — CBC WITH DIFFERENTIAL/PLATELET
Abs Immature Granulocytes: 0 10*3/uL (ref 0.00–0.07)
Basophils Absolute: 0.1 10*3/uL (ref 0.0–0.1)
Basophils Relative: 1 %
Eosinophils Absolute: 0.2 10*3/uL (ref 0.0–0.5)
Eosinophils Relative: 3 %
HCT: 43.2 % (ref 39.0–52.0)
Hemoglobin: 14.5 g/dL (ref 13.0–17.0)
Immature Granulocytes: 0 %
Lymphocytes Relative: 37 %
Lymphs Abs: 2.9 10*3/uL (ref 0.7–4.0)
MCH: 29.8 pg (ref 26.0–34.0)
MCHC: 33.6 g/dL (ref 30.0–36.0)
MCV: 88.7 fL (ref 80.0–100.0)
Monocytes Absolute: 0.6 10*3/uL (ref 0.1–1.0)
Monocytes Relative: 8 %
Neutro Abs: 4 10*3/uL (ref 1.7–7.7)
Neutrophils Relative %: 51 %
Platelets: 320 10*3/uL (ref 150–400)
RBC: 4.87 MIL/uL (ref 4.22–5.81)
RDW: 13.4 % (ref 11.5–15.5)
WBC: 7.8 10*3/uL (ref 4.0–10.5)
nRBC: 0 % (ref 0.0–0.2)

## 2018-08-29 LAB — PROTIME-INR
INR: 1 (ref 0.8–1.2)
Prothrombin Time: 12.9 seconds (ref 11.4–15.2)

## 2018-08-29 LAB — BASIC METABOLIC PANEL
Anion gap: 9 (ref 5–15)
BUN: 16 mg/dL (ref 6–20)
CO2: 25 mmol/L (ref 22–32)
Calcium: 9.1 mg/dL (ref 8.9–10.3)
Chloride: 105 mmol/L (ref 98–111)
Creatinine, Ser: 1.23 mg/dL (ref 0.61–1.24)
GFR calc Af Amer: >60 mL/min (ref >60)
GFR calc non Af Amer: >60 mL/min (ref >60)
Glucose, Bld: 118 mg/dL — ABNORMAL HIGH (ref 70–99)
Potassium: 3.3 mmol/L — ABNORMAL LOW (ref 3.5–5.1)
Sodium: 139 mmol/L (ref 135–145)

## 2018-08-29 LAB — TROPONIN I: Troponin I: <0.03 ng/mL (ref <0.03)

## 2018-08-29 LAB — SARS CORONAVIRUS 2: SARS Coronavirus 2: NOT DETECTED

## 2018-08-29 LAB — SARS CORONAVIRUS 2 AG (30 MIN TAT): SARS Coronavirus 2 Ag: NEGATIVE

## 2018-08-29 MED ORDER — ALBUTEROL SULFATE (2.5 MG/3ML) 0.083% IN NEBU
3.0000 mL | INHALATION_SOLUTION | Freq: Four times a day (QID) | RESPIRATORY_TRACT | Status: DC | PRN
  Administered 2018-09-04: 3 mL via RESPIRATORY_TRACT
  Filled 2018-08-29 (×2): qty 3

## 2018-08-29 MED ORDER — ALBUTEROL SULFATE HFA 108 (90 BASE) MCG/ACT IN AERS
10.0000 | INHALATION_SPRAY | Freq: Once | RESPIRATORY_TRACT | Status: AC
  Administered 2018-08-29 (×2): 10 via RESPIRATORY_TRACT

## 2018-08-29 MED ORDER — MONTELUKAST SODIUM 10 MG PO TABS
10.0000 mg | ORAL_TABLET | Freq: Every day | ORAL | Status: DC
  Administered 2018-08-29 – 2018-09-04 (×7): 10 mg via ORAL
  Filled 2018-08-29 (×8): qty 1

## 2018-08-29 MED ORDER — ALLOPURINOL 100 MG PO TABS
100.0000 mg | ORAL_TABLET | Freq: Every day | ORAL | Status: DC
  Administered 2018-08-30 – 2018-09-06 (×8): 100 mg via ORAL
  Filled 2018-08-29 (×8): qty 1

## 2018-08-29 MED ORDER — FENTANYL CITRATE (PF) 100 MCG/2ML IJ SOLN
INTRAMUSCULAR | Status: AC | PRN
  Administered 2018-08-29 (×2): 50 ug via INTRAVENOUS

## 2018-08-29 MED ORDER — TRAMADOL HCL 50 MG PO TABS
50.0000 mg | ORAL_TABLET | Freq: Four times a day (QID) | ORAL | Status: DC | PRN
  Administered 2018-08-29 – 2018-09-06 (×5): 50 mg via ORAL
  Filled 2018-08-29 (×5): qty 1

## 2018-08-29 MED ORDER — OXYCODONE HCL 5 MG PO TABS
5.0000 mg | ORAL_TABLET | ORAL | Status: DC | PRN
  Administered 2018-08-29 – 2018-09-06 (×22): 5 mg via ORAL
  Filled 2018-08-29 (×23): qty 1

## 2018-08-29 MED ORDER — MIDAZOLAM HCL 2 MG/2ML IJ SOLN
INTRAMUSCULAR | Status: AC
  Filled 2018-08-29: qty 2

## 2018-08-29 MED ORDER — SODIUM CHLORIDE 0.9% FLUSH
3.0000 mL | Freq: Two times a day (BID) | INTRAVENOUS | Status: DC
  Administered 2018-08-29 – 2018-09-02 (×7): 3 mL via INTRAVENOUS

## 2018-08-29 MED ORDER — ONDANSETRON HCL 4 MG/2ML IJ SOLN: 4.0000 mg | Freq: Four times a day (QID) | INTRAMUSCULAR | Status: DC | PRN

## 2018-08-29 MED ORDER — ERYTHROMYCIN 5 MG/GM OP OINT: TOPICAL_OINTMENT | Freq: Three times a day (TID) | OPHTHALMIC | Status: DC

## 2018-08-29 MED ORDER — IPRATROPIUM BROMIDE HFA 17 MCG/ACT IN AERS
2.0000 | INHALATION_SPRAY | Freq: Once | RESPIRATORY_TRACT | Status: DC
  Filled 2018-08-29: qty 12.9

## 2018-08-29 MED ORDER — LIDOCAINE HCL (PF) 1 % IJ SOLN
5.0000 mL | Freq: Once | INTRAMUSCULAR | Status: DC
  Filled 2018-08-29: qty 5

## 2018-08-29 MED ORDER — ACETAMINOPHEN 325 MG PO TABS
650.0000 mg | ORAL_TABLET | Freq: Four times a day (QID) | ORAL | Status: DC | PRN
  Administered 2018-08-30: 06:00:00 650 mg via ORAL
  Filled 2018-08-29: qty 2

## 2018-08-29 MED ORDER — AMLODIPINE BESYLATE 10 MG PO TABS
10.0000 mg | ORAL_TABLET | Freq: Every day | ORAL | Status: DC
  Administered 2018-08-30 – 2018-09-06 (×8): 10 mg via ORAL
  Filled 2018-08-29: qty 2
  Filled 2018-08-29 (×4): qty 1
  Filled 2018-08-29 (×2): qty 2
  Filled 2018-08-29: qty 1

## 2018-08-29 MED ORDER — ONDANSETRON HCL 4 MG PO TABS: 4.0000 mg | ORAL_TABLET | Freq: Four times a day (QID) | ORAL | Status: DC | PRN

## 2018-08-29 MED ORDER — TRAZODONE HCL 150 MG PO TABS
150.0000 mg | ORAL_TABLET | Freq: Every evening | ORAL | Status: DC | PRN
  Administered 2018-08-29 – 2018-09-04 (×7): 150 mg via ORAL
  Filled 2018-08-29 (×7): qty 1

## 2018-08-29 MED ORDER — SODIUM CHLORIDE 0.9% FLUSH
3.0000 mL | INTRAVENOUS | Status: DC | PRN
  Administered 2018-08-30: 3 mL via INTRAVENOUS
  Filled 2018-08-29: qty 3

## 2018-08-29 MED ORDER — FAMOTIDINE 20 MG PO TABS
20.0000 mg | ORAL_TABLET | Freq: Two times a day (BID) | ORAL | Status: DC
  Administered 2018-08-29 – 2018-09-06 (×16): 20 mg via ORAL
  Filled 2018-08-29 (×16): qty 1

## 2018-08-29 MED ORDER — FLUTICASONE PROPIONATE 50 MCG/ACT NA SUSP
2.0000 | Freq: Every day | NASAL | Status: DC
  Administered 2018-08-31 – 2018-09-05 (×6): 2 via NASAL
  Filled 2018-08-29: qty 16

## 2018-08-29 MED ORDER — LORATADINE 10 MG PO TABS
10.0000 mg | ORAL_TABLET | Freq: Every day | ORAL | Status: DC
  Administered 2018-08-30 – 2018-09-06 (×8): 10 mg via ORAL
  Filled 2018-08-29 (×8): qty 1

## 2018-08-29 MED ORDER — COLCHICINE 0.6 MG PO TABS
0.6000 mg | ORAL_TABLET | Freq: Two times a day (BID) | ORAL | Status: DC
  Administered 2018-08-29 – 2018-09-06 (×16): 0.6 mg via ORAL
  Filled 2018-08-29 (×16): qty 1

## 2018-08-29 MED ORDER — LEVOTHYROXINE SODIUM 75 MCG PO TABS
150.0000 ug | ORAL_TABLET | Freq: Every day | ORAL | Status: DC
  Administered 2018-08-30 – 2018-09-06 (×8): 150 ug via ORAL
  Filled 2018-08-29 (×9): qty 2

## 2018-08-29 MED ORDER — FENTANYL CITRATE (PF) 100 MCG/2ML IJ SOLN
12.5000 ug | INTRAMUSCULAR | Status: DC | PRN
  Administered 2018-08-29: 19:00:00 12.5 ug via INTRAVENOUS
  Administered 2018-08-30 – 2018-08-31 (×2): 25 ug via INTRAVENOUS
  Administered 2018-09-01 (×2): 50 ug via INTRAVENOUS
  Administered 2018-09-01: 25 ug via INTRAVENOUS
  Administered 2018-09-01: 50 ug via INTRAVENOUS
  Administered 2018-09-01: 25 ug via INTRAVENOUS
  Administered 2018-09-01 (×2): 100 ug via INTRAVENOUS
  Administered 2018-09-01: 50 ug via INTRAVENOUS
  Administered 2018-09-01: 11:00:00 25 ug via INTRAVENOUS
  Administered 2018-09-01 (×2): 50 ug via INTRAVENOUS
  Filled 2018-08-29 (×6): qty 2

## 2018-08-29 MED ORDER — PREDNISONE 50 MG PO TABS
60.0000 mg | ORAL_TABLET | Freq: Once | ORAL | Status: AC
  Administered 2018-08-29: 60 mg via ORAL
  Filled 2018-08-29: qty 1

## 2018-08-29 MED ORDER — SODIUM CHLORIDE 0.9% FLUSH
3.0000 mL | Freq: Two times a day (BID) | INTRAVENOUS | Status: DC
  Administered 2018-08-30 – 2018-09-04 (×6): 3 mL via INTRAVENOUS

## 2018-08-29 MED ORDER — ENOXAPARIN SODIUM 30 MG/0.3ML ~~LOC~~ SOLN
30.0000 mg | SUBCUTANEOUS | Status: DC
  Administered 2018-08-31: 30 mg via SUBCUTANEOUS
  Filled 2018-08-29 (×2): qty 0.3

## 2018-08-29 MED ORDER — ACETAMINOPHEN 650 MG RE SUPP: 650.0000 mg | Freq: Four times a day (QID) | RECTAL | Status: DC | PRN

## 2018-08-29 MED ORDER — ALBUTEROL SULFATE HFA 108 (90 BASE) MCG/ACT IN AERS
INHALATION_SPRAY | RESPIRATORY_TRACT | Status: AC
  Administered 2018-08-29: 14:00:00 10 via RESPIRATORY_TRACT
  Filled 2018-08-29: qty 6.7

## 2018-08-29 MED ORDER — HYDRALAZINE HCL 20 MG/ML IJ SOLN: 10.0000 mg | Freq: Four times a day (QID) | INTRAMUSCULAR | Status: DC | PRN

## 2018-08-29 MED ORDER — FENTANYL CITRATE (PF) 100 MCG/2ML IJ SOLN
INTRAMUSCULAR | Status: AC
  Filled 2018-08-29: qty 2

## 2018-08-29 MED ORDER — DOCUSATE SODIUM 100 MG PO CAPS
100.0000 mg | ORAL_CAPSULE | Freq: Every day | ORAL | Status: DC
  Administered 2018-08-30 – 2018-09-03 (×4): 100 mg via ORAL
  Filled 2018-08-29 (×7): qty 1

## 2018-08-29 MED ORDER — BISACODYL 5 MG PO TBEC: 5.0000 mg | DELAYED_RELEASE_TABLET | Freq: Every day | ORAL | Status: DC | PRN

## 2018-08-29 MED ORDER — MIDAZOLAM HCL 2 MG/2ML IJ SOLN
INTRAMUSCULAR | Status: AC | PRN
  Administered 2018-08-29 (×2): 1 mg via INTRAVENOUS

## 2018-08-29 MED ORDER — SODIUM CHLORIDE 0.9 % IV SOLN: 250.0000 mL | INTRAVENOUS | Status: DC | PRN

## 2018-08-29 MED ORDER — CLONIDINE HCL 0.2 MG PO TABS
0.2000 mg | ORAL_TABLET | Freq: Two times a day (BID) | ORAL | Status: DC
  Administered 2018-08-30 – 2018-09-06 (×15): 0.2 mg via ORAL
  Filled 2018-08-29 (×15): qty 1

## 2018-08-29 NOTE — ED Notes (Signed)
Placed on 4 l/m [place]

## 2018-08-29 NOTE — ED Notes (Signed)
ED Provider at bedside. 

## 2018-08-29 NOTE — ED Notes (Signed)
Patient transported to X-ray 
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
## 2018-08-29 NOTE — ED Provider Notes (Signed)
  Physical Exam  BP 121/82 (BP Location: Left Arm)   Pulse 85   Temp 98 F (36.7 C) (Oral)   Resp 16   Ht 6\' 2"  (1.88 m)   Wt 103 kg   SpO2 95%   BMI 29.15 kg/m   Physical Exam   gen: appears nontoxic Pulm: in no respiratory distress  ED Course/Procedures     Procedures  MDM   Pt transferred from [facility] for SOB. PNX on cxr. sxs have been present for 1 day. Pt denying pain at this time. On my exam, no respiratory distress. Previous provider note states CT surgery requests page upon arrival to [facility].   Discussed with CT surgery, requests IR consult for them to place the chest tube. IR consulted.   Discussed with IR, and chest tube procedure order placed. Pt admitted by CT surgery.       Franchot Heidelberg, PA-C 08/29/18 [place], [place], DO 08/30/18 1308
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
## 2018-08-29 NOTE — ED Notes (Signed)
Patient transported to CT for his chest tube insertion

## 2018-08-29 NOTE — ED Notes (Signed)
SARS Covid swab was done & sent to lab.

## 2018-08-29 NOTE — ED Notes (Signed)
Cardiothoracic surgery at bedside discussing with pt that he recommends to get a CT guided chest tube.

## 2018-08-29 NOTE — ED Triage Notes (Addendum)
C/o sob and rt side chest pain onset yesterday after mowing yard SOB   Worse w exertion  PAIN INCREASED W BENDING OVER AND LYING DOWN

## 2018-08-29 NOTE — Procedures (Signed)
Interventional Radiology Procedure:    Indications: Right pneumothorax  Procedure: CT guided right chest tube placement  Findings: Large right pneumothorax.  Placement of 14 Fr drain and decompression of the pneumothorax.  Complications: None     EBL: Minimal, less than 10 ml  Plan: Portable CXR.  Admission to hospital.     Stephan Minister. Anselm Pancoast, MD  Pager: [phone]

## 2018-08-30 ENCOUNTER — Inpatient Hospital Stay (HOSPITAL_COMMUNITY): Payer: Self-pay

## 2018-08-30 ENCOUNTER — Encounter (HOSPITAL_COMMUNITY): Payer: Self-pay | Admitting: General Practice

## 2018-08-30 LAB — BASIC METABOLIC PANEL
Anion gap: 9 (ref 5–15)
BUN: 15 mg/dL (ref 6–20)
CO2: 24 mmol/L (ref 22–32)
Calcium: 8.6 mg/dL — ABNORMAL LOW (ref 8.9–10.3)
Chloride: 105 mmol/L (ref 98–111)
Creatinine, Ser: 1.07 mg/dL (ref 0.61–1.24)
GFR calc Af Amer: >60 mL/min (ref >60)
GFR calc non Af Amer: >60 mL/min (ref >60)
Glucose, Bld: 107 mg/dL — ABNORMAL HIGH (ref 70–99)
Potassium: 3.6 mmol/L (ref 3.5–5.1)
Sodium: 138 mmol/L (ref 135–145)

## 2018-08-30 MED ORDER — ALUM & MAG HYDROXIDE-SIMETH 200-200-20 MG/5ML PO SUSP
30.0000 mL | Freq: Four times a day (QID) | ORAL | Status: DC | PRN
  Administered 2018-08-30: 30 mL via ORAL
  Filled 2018-08-30: qty 30

## 2018-08-30 NOTE — Plan of Care (Signed)
Progressing

## 2018-08-30 NOTE — Progress Notes (Addendum)
  Subjective: Say he feel well, denies pain or shortness of breath.   Objective: Vital signs in last 24 hours: Temp:  [98 F (36.7 C)-98.4 F (36.9 C)] 98.1 F (36.7 C) (06/16 0019) Pulse Rate:  [84-104] 88 (06/16 0019) Cardiac Rhythm: Normal sinus rhythm (06/15 1845) Resp:  [15-21] 16 (06/16 0019) BP: (109-142)/(79-99) 124/83 (06/16 0019) SpO2:  [94 %-98 %] 95 % (06/16 0019) Weight:  [103 kg] 103 kg (06/15 1136)   Intake/Output from previous day: 06/15 0701 - 06/16 0700 In: 150 [P.O.:150] Out: 928 [Urine:900; Chest Tube:28] Intake/Output this shift: No intake/output data recorded.  General appearance: alert, cooperative and no distress Neurologic: intact Heart: regular rate and rhythm Lungs: Breath sounds are clear. Small air leak with spontaneous respiration.CXR reviewed: small apical PTX  remains.  Wound: the CT is covered with a dry dressing, all connections are secure.   Lab Results: Recent Labs    08/29/18 1201  WBC 7.8  HGB 14.5  HCT 43.2  PLT 320   BMET:  Recent Labs    08/29/18 1201 08/30/18 0416  NA 139 138  K 3.3* 3.6  CL 105 105  CO2 25 24  GLUCOSE 118* 107*  BUN 16 15  CREATININE 1.23 1.07  CALCIUM 9.1 8.6*    PT/INR:  Recent Labs    08/29/18 1455  LABPROT 12.9  INR 1.0   ABG    Component Value Date/Time   PHART 7.392 01/04/2013 0450   HCO3 25.9 (H) 01/04/2013 0450   TCO2 23 09/11/2014 1751   O2SAT 95.8 01/04/2013 0450   CBG (last 3)  No results for input(s): GLUCAP in the last 72 hours.  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  Right pleural pigtail pleural tube placed by IR 08/29/18.  Small residual apical PTX remains. Continue CT on suction.  F/U CXR in AM.   -History of HTN- controlled with his usual medications including clonidine and amlodipine.  -History of hypothyrodism following thyroidectomy for for cancer. Synthroid resumed.   -DVT PPX-continue  enoxaparin, encourage ambulation.  Antony Odea  [phone] 08/30/2018  I have seen and examined the patient and agree with the assessment and plan as outlined.  Rexene Alberts, MD 08/30/2018 8:25 AM

## 2018-08-30 NOTE — Progress Notes (Signed)
Referring Physician(s): Darylene Price  Supervising Physician: Markus Daft  Patient Status:  [facility] - In-pt  Chief Complaint:  Spontaneous pneumothorax S/P Pigtail Chest tube by Dr. Anselm Pancoast 08/29/2018  Subjective:  Sitting up in bed. No complaints.   Allergies: Patient has no known allergies.  Medications: Prior to Admission medications   Medication Sig Start Date End Date Taking? Authorizing Provider  albuterol (PROVENTIL HFA;VENTOLIN HFA) 108 (90 Base) MCG/ACT inhaler Inhale 1-2 puffs into the lungs every 6 (six) hours as needed for wheezing or shortness of breath. 06/23/18   Azzie Glatter, FNP  albuterol (PROVENTIL) (2.5 MG/3ML) 0.083% nebulizer solution Take 3 mLs (2.5 mg total) by nebulization every 6 (six) hours as needed for wheezing or shortness of breath. 06/23/18   Azzie Glatter, FNP  allopurinol (ZYLOPRIM) 100 MG tablet Take 1 tablet (100 mg total) by mouth daily. 07/22/18   Azzie Glatter, FNP  amLODipine (NORVASC) 10 MG tablet Take 1 tablet (10 mg total) by mouth daily. For high blood pressure 07/22/18   Azzie Glatter, FNP  cloNIDine (CATAPRES) 0.2 MG tablet Take 1 tablet (0.2 mg total) by mouth every 6 (six) hours as needed (sbp>140 or DBP>90). 07/22/18   Azzie Glatter, FNP  colchicine 0.6 MG tablet Take 1 tablet (0.6 mg total) by mouth 2 (two) times daily. 07/22/18   Azzie Glatter, FNP  erythromycin ophthalmic ointment Place a 1/2 inch ribbon of ointment into both lower eyelids three times per day. 06/21/18   Long, Wonda Olds, MD  famotidine (PEPCID) 20 MG tablet Take 1 tablet (20 mg total) by mouth 2 (two) times daily. 07/22/18   Azzie Glatter, FNP  fluticasone (FLONASE) 50 MCG/ACT nasal spray Place 2 sprays into both nostrils daily. 07/22/18   Azzie Glatter, FNP  levothyroxine (SYNTHROID) 150 MCG tablet Take 1 tablet (150 mcg total) by mouth daily before breakfast. 07/22/18   Azzie Glatter, FNP  loratadine (CLARITIN) 10 MG tablet Take 1 tablet (10 mg total)  by mouth daily. 07/22/18   Azzie Glatter, FNP  montelukast (SINGULAIR) 10 MG tablet Take 1 tablet (10 mg total) by mouth at bedtime. 07/22/18   Azzie Glatter, FNP  traZODone (DESYREL) 150 MG tablet Take 1 tablet (150 mg total) by mouth at bedtime as needed for sleep. 07/22/18   Azzie Glatter, FNP     Vital Signs: BP 124/83 (BP Location: Left Arm)   Pulse 88   Temp 98.1 F (36.7 C) (Oral)   Resp 16   Ht 6\' 2"  (1.88 m)   Wt 103 kg   SpO2 95%   BMI 29.15 kg/m   Physical Exam Awake and alert Sitting up in bed Chest tube in place No air leak Remains to suction  Imaging: Dg Chest 2 View  Result Date: 08/29/2018 CLINICAL DATA:  Shortness of breath and chest pain. EXAM: CHEST - 2 VIEW COMPARISON:  02/19/2018. FINDINGS: Moderate right hydropneumothorax, most likely under tension noted. Lungs are clear of infiltrates. Heart size normal. No acute bony abnormality. IMPRESSION: Moderate right hydropneumothorax, most likely under tension. Critical Value/emergent results were called by telephone at the time of interpretation on 08/29/2018 at 12:35 pm to Dr. Lennice Sites , who verbally acknowledged these results. Electronically Signed   By: Marcello Moores  Register   On: 08/29/2018 12:38   Dg Chest Port 1 View  Result Date: 08/30/2018 CLINICAL DATA:  48 year old male with a history of spontaneous pneumothorax EXAM: PORTABLE CHEST 1 VIEW  COMPARISON:  08/29/2018, CT 08/29/2018 FINDINGS: Cardiomediastinal silhouette unchanged in size and contour. No confluent airspace disease or pleural effusion. Improving opacity at the right lung base with unchanged position of the right thoracostomy tube. Small residual pneumothorax at the apex. IMPRESSION: Unchanged right thoracostomy tube, with persistent small right pneumothorax. Electronically Signed   By: Corrie Mckusick D.O.   On: 08/30/2018 08:43   Dg Chest Port 1 View  Result Date: 08/29/2018 CLINICAL DATA:  Pneumothorax EXAM: PORTABLE CHEST 1 VIEW  COMPARISON:  08/29/2018 FINDINGS: The patient has undergone right-sided chest tube placement. There is significant interval improvement in the previously demonstrated large right-sided pneumothorax. There remains a small primarily a pickle right-sided pneumothorax. The pigtail terminates over the right lower lung field. There is no large area of consolidation. The left lung field is essentially clear. The heart size is normal. There is no definite acute osseous abnormality. IMPRESSION: Significant interval improvement in the patient's right-sided pneumothorax status post placement of a right-sided chest tube. There remains a small residual right apical pneumothorax. Electronically Signed   By: Constance Holster M.D.   On: 08/29/2018 18:20   Ct Image Guided Drainage By Percutaneous Catheter  Result Date: 08/29/2018 INDICATION: 48 year old with history of spontaneous pneumothorax and prior VATS procedure in 2014. Patient presents with shortness of breath and new large right pneumothorax. EXAM: CT-GUIDED PLACEMENT OF RIGHT CHEST TUBE MEDICATIONS: Moderate sedation medications ANESTHESIA/SEDATION: 2 mg IV Versed 100 mcg IV Fentanyl Moderate Sedation Time:  23 minutes The patient was continuously monitored during the procedure by the interventional radiology nurse under my direct supervision. COMPLICATIONS: None immediate. TECHNIQUE: Informed written consent was obtained from the patient after a thorough discussion of the procedural risks, benefits and alternatives. All questions were addressed. Maximal Sterile Barrier Technique was utilized including caps, mask, sterile gowns, sterile gloves, sterile drape, hand hygiene and skin antiseptic. A timeout was performed prior to the initiation of the procedure. PROCEDURE: Patient was placed supine on the CT scanner. CT images through the chest were obtained. Large right pneumothorax was identified. The right lateral chest was prepped with chlorhexidine and sterile field  was created. Skin and soft tissues anesthetized with 1% lidocaine. 18 gauge needle directed into the pleural space with CT guidance. Air was aspirated. Stiff Amplatz wire was advanced into the pleural space and the tract was dilated to accommodate a 14 [place] multipurpose drain. Catheter was sutured to skin. Drain was attached to a Pleur-evac chest tube system. The chest tube was placed to suction. After approximately 30 seconds, patient started to complain of pain and started coughing. Suction was temporarily stopped and follow up CT images were obtained to evaluate the pneumothorax. Dressing was placed over the chest tube. FINDINGS: Large right pneumothorax, particularly along the right lung base. Drain was advanced into the right lower chest in the mid axillary region. There appeared to be resolution or near resolution of the pneumothorax on follow up CT images. IMPRESSION: CT-guided placement of a right chest tube. Electronically Signed   By: Markus Daft M.D.   On: 08/29/2018 17:33    Labs:  CBC: Recent Labs    12/07/17 0807 01/20/18 1100 02/19/18 0844 08/29/18 1201  WBC 6.9 8.4 9.5 7.8  HGB 14.2 14.0 15.2 14.5  HCT 42.9 41.5 45.0 43.2  PLT 304 310 320 320    COAGS: Recent Labs    08/29/18 1455  INR 1.0    BMP: Recent Labs    01/20/18 1100 02/19/18 0852 08/29/18 1201 08/30/18 0416  NA 140 139 139 138  K 3.2* 3.2* 3.3* 3.6  CL 103 102 105 105  CO2 28 26 25 24   GLUCOSE 111* 93 118* 107*  BUN 16 15 16 15   CALCIUM 8.8* 9.0 9.1 8.6*  CREATININE 1.23 1.43* 1.23 1.07  GFRNONAA >60 57* >60 >60  GFRAA >60 >60 >60 >60    LIVER FUNCTION TESTS: Recent Labs    11/23/17 1153 12/07/17 0807 01/20/18 1100 02/19/18 0852  BILITOT 0.8 0.8 1.0 1.4*  AST 24 25 35 44*  ALT 26 22 30  34  ALKPHOS 48 54 50 54  PROT 7.0 7.0 7.3 7.9  ALBUMIN 4.4 4.0 4.5 5.0    Assessment and Plan:  Spontaneous pneumothorax  S/P Pigtail Chest tube by Dr. Anselm Pancoast 08/29/2018  CXR this morning showed  persistent small right pneumothorax.  Care per CT surgery.  Electronically Signed: Murrell Redden, PA-C 08/30/2018, 9:35 AM    I spent a total of 15 Minutes at the the patient's bedside AND on the patient's hospital floor or unit, greater than 50% of which was counseling/coordinating care for f/u chest tube.

## 2018-08-30 NOTE — Plan of Care (Signed)
  Problem: Education: Goal: Knowledge of General Education information will improve Description: Including pain rating scale, medication(s)/side effects and non-pharmacologic comfort measures Outcome: Completed/Met   Problem: Pain Managment: Goal: General experience of comfort will improve Outcome: Completed/Met   Problem: Safety: Goal: Ability to remain free from injury will improve Outcome: Completed/Met   Problem: Skin Integrity: Goal: Risk for impaired skin integrity will decrease Outcome: Completed/Met

## 2018-08-30 NOTE — Plan of Care (Signed)
  Problem: Education: Goal: Knowledge of General Education information will improve Description: Including pain rating scale, medication(s)/side effects and non-pharmacologic comfort measures Outcome: Completed/Met   Problem: Clinical Measurements: Goal: Ability to maintain clinical measurements within normal limits will improve Outcome: Completed/Met Goal: Will remain free from infection Outcome: Completed/Met   

## 2018-08-31 ENCOUNTER — Inpatient Hospital Stay (HOSPITAL_COMMUNITY): Payer: Self-pay

## 2018-08-31 NOTE — Progress Notes (Signed)
Patient ID: Nicholas Mccarty, male   DOB: [birth year], 48 y.o.   MRN: [id number]   IR Round note Via phone Lorenza Chick RN   Spontaneous pneumothorax S/P Pigtail Chest tube by Dr. Anselm Pancoast 08/29/2018  CXR today:  IMPRESSION: 1. The right-sided pneumothorax is larger in the interval now measuring 19 mm at the apex today versus 14 mm previously. There is also a component in the right lateral base which was not seen previously. The pneumothorax is small to moderate today versus small yesterday. The right chest tube has been pulled back a couple of cm but appears to remain in good position. No other interval changes. 2. Mild opacity in the right mid lower lung may represent atelectasis or infiltrate. Recommend attention on follow-up.   RN states CT dressing are intact Site is c/d/i OP minimal into Pleurvac No air leak Wall suction  Plan per TCTS

## 2018-08-31 NOTE — Progress Notes (Addendum)
      [address]       [place] [address]             [phone]      Subjective: Feels well. No shortness of breath. No new concerns.  Objective: Vital signs in last 24 hours: Temp:  [98 F (36.7 C)-98.2 F (36.8 C)] 98.1 F (36.7 C) (06/17 0755) Pulse Rate:  [61-79] 79 (06/17 0755) Cardiac Rhythm: Other (Comment) (06/17 0037) Resp:  [17-19] 17 (06/17 0755) BP: (96-141)/(61-97) 112/87 (06/17 0755) SpO2:  [95 %-99 %] 95 % (06/17 0755)    Intake/Output from previous day: 06/16 0701 - 06/17 0700 In: -  Out: 748 [Urine:700; Chest Tube:48] Intake/Output this shift: No intake/output data recorded.  General appearance: alert, cooperative and no distress Neurologic: intact Heart: regular rate and rhythm Lungs: Breath sounds are clear. Air leak has slowed since yesterday. CXR reviewed: small apical PTX  remains unchanged.  Wound: the CT is covered with a dry dressing, all connections are secure.   Lab Results: Recent Labs    08/29/18 1201  WBC 7.8  HGB 14.5  HCT 43.2  PLT 320   BMET:  Recent Labs    08/29/18 1201 08/30/18 0416  NA 139 138  K 3.3* 3.6  CL 105 105  CO2 25 24  GLUCOSE 118* 107*  BUN 16 15  CREATININE 1.23 1.07  CALCIUM 9.1 8.6*    PT/INR:  Recent Labs    08/29/18 1455  LABPROT 12.9  INR 1.0   ABG    Component Value Date/Time   PHART 7.392 01/04/2013 0450   HCO3 25.9 (H) 01/04/2013 0450   TCO2 23 09/11/2014 1751   O2SAT 95.8 01/04/2013 0450   CBG (last 3)  No results for input(s): GLUCAP in the last 72 hours.  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  Right pleural pigtail pleural tube placed by IR 08/29/18.  Small residual apical PTX remains. Continue CT on suction.  F/U CXR in AM.   -History of HTN- controlled with his usual medications including clonidine and amlodipine.  -History of hypothyrodism following thyroidectomy for for  cancer. Synthroid resumed.   -DVT PPX-continue enoxaparin, encourage ambulation.   LOS: 2 days   Antony Odea, PA-C [phone] 08/31/2018   I have seen and examined the patient and agree with the assessment and plan as outlined.  No air leak seen.  Will plan to place tube to water seal tomorrow if CXR remains unchanged.   Rexene Alberts, MD 08/31/2018 5:57 PM

## 2018-08-31 NOTE — Progress Notes (Signed)
Patient called out with c/o right sided chest pain that started after being up to the bathroom having a bowel movement. Patient states pain 10/10.  EKG done. Dr. Ree Kida. V/s stable. New orders received.  Chest tube dressing removed. Tube appeared intact with small intermittent air leak.   Emelda Fear, RN

## 2018-08-31 NOTE — Progress Notes (Addendum)
[facility] called after stat order put in. Started they would be here shortly.   1731 Xray called back a second time and they sated they were on 2 H and be here shortly. Patient in apparent pain but v/s remain stable.  Obvious air leak.

## 2018-09-01 ENCOUNTER — Inpatient Hospital Stay (HOSPITAL_COMMUNITY): Payer: Self-pay

## 2018-09-01 ENCOUNTER — Inpatient Hospital Stay (HOSPITAL_COMMUNITY): Payer: Self-pay | Admitting: Certified Registered"

## 2018-09-01 ENCOUNTER — Encounter (HOSPITAL_COMMUNITY)
Admission: EM | Disposition: A | Discharge status: Home / Self Care | Payer: Self-pay | Source: Home / Self Care | Attending: Thoracic Surgery (Cardiothoracic Vascular Surgery)

## 2018-09-01 ENCOUNTER — Encounter (HOSPITAL_COMMUNITY): Payer: Self-pay | Admitting: Certified Registered Nurse Anesthetist

## 2018-09-01 DIAGNOSIS — J939 Pneumothorax, unspecified: Secondary | ICD-10-CM

## 2018-09-01 HISTORY — PX: VIDEO ASSISTED THORACOSCOPY: SHX5073

## 2018-09-01 LAB — MRSA PCR SCREENING: MRSA by PCR: NEGATIVE

## 2018-09-01 LAB — TYPE AND SCREEN
ABO/RH(D): A POS
Antibody Screen: NEGATIVE

## 2018-09-01 LAB — PREPARE RBC (CROSSMATCH)

## 2018-09-01 LAB — APTT: aPTT: 32 seconds (ref 24–36)

## 2018-09-01 SURGERY — VIDEO ASSISTED THORACOSCOPY
Anesthesia: General | Site: Chest | Laterality: Right

## 2018-09-01 MED ORDER — LIDOCAINE 2% (20 MG/ML) 5 ML SYRINGE
INTRAMUSCULAR | Status: AC
  Filled 2018-09-01: qty 5

## 2018-09-01 MED ORDER — SUCCINYLCHOLINE CHLORIDE 200 MG/10ML IV SOSY
PREFILLED_SYRINGE | INTRAVENOUS | Status: AC
  Filled 2018-09-01: qty 10

## 2018-09-01 MED ORDER — SODIUM CHLORIDE 0.9% IV SOLUTION
Freq: Once | INTRAVENOUS | Status: AC
  Administered 2018-09-01: 1000 mL via INTRAVENOUS

## 2018-09-01 MED ORDER — MIDAZOLAM HCL 2 MG/2ML IJ SOLN
INTRAMUSCULAR | Status: AC
  Filled 2018-09-01: qty 2

## 2018-09-01 MED ORDER — ONDANSETRON HCL 4 MG/2ML IJ SOLN: 4.0000 mg | Freq: Four times a day (QID) | INTRAMUSCULAR | Status: DC | PRN

## 2018-09-01 MED ORDER — DEXAMETHASONE SODIUM PHOSPHATE 10 MG/ML IJ SOLN
INTRAMUSCULAR | Status: DC | PRN
  Administered 2018-09-01: 10 mg via INTRAVENOUS

## 2018-09-01 MED ORDER — LIDOCAINE HCL (CARDIAC) PF 100 MG/5ML IV SOSY
PREFILLED_SYRINGE | INTRAVENOUS | Status: DC | PRN
  Administered 2018-09-01: 100 mg via INTRAVENOUS

## 2018-09-01 MED ORDER — ROCURONIUM BROMIDE 10 MG/ML (PF) SYRINGE
PREFILLED_SYRINGE | INTRAVENOUS | Status: AC
  Filled 2018-09-01: qty 10

## 2018-09-01 MED ORDER — ROCURONIUM BROMIDE 50 MG/5ML IV SOSY
PREFILLED_SYRINGE | INTRAVENOUS | Status: DC | PRN
  Administered 2018-09-01: 30 mg via INTRAVENOUS
  Administered 2018-09-01 (×2): 50 mg via INTRAVENOUS

## 2018-09-01 MED ORDER — CEFAZOLIN SODIUM-DEXTROSE 2-3 GM-%(50ML) IV SOLR
INTRAVENOUS | Status: DC | PRN
  Administered 2018-09-01: 3 g via INTRAVENOUS

## 2018-09-01 MED ORDER — PROPOFOL 10 MG/ML IV BOLUS
INTRAVENOUS | Status: DC | PRN
  Administered 2018-09-01: 150 mg via INTRAVENOUS

## 2018-09-01 MED ORDER — SODIUM CHLORIDE 0.9 % IV SOLN
INTRAVENOUS | Status: DC | PRN
  Administered 2018-09-01: 13:00:00 25 ug/min via INTRAVENOUS

## 2018-09-01 MED ORDER — HYDROMORPHONE 1 MG/ML IV SOLN
INTRAVENOUS | Status: DC
  Administered 2018-09-01: 30 mg via INTRAVENOUS
  Administered 2018-09-01: 3.5 mg via INTRAVENOUS
  Administered 2018-09-02: 2.4 mg via INTRAVENOUS
  Administered 2018-09-02 (×2): 1.5 mg via INTRAVENOUS
  Administered 2018-09-02: 1.8 mg via INTRAVENOUS
  Administered 2018-09-02: 3.6 mg via INTRAVENOUS
  Administered 2018-09-03: 30 mg via INTRAVENOUS
  Administered 2018-09-03: 2.1 mg via INTRAVENOUS
  Administered 2018-09-03: 2.7 mg via INTRAVENOUS
  Administered 2018-09-03: 2.4 mg via INTRAVENOUS
  Administered 2018-09-04 (×3): 0 mg via INTRAVENOUS
  Administered 2018-09-05: 30 mg via INTRAVENOUS
  Administered 2018-09-05: 2.7 mg via INTRAVENOUS
  Administered 2018-09-05: 2.1 mg via INTRAVENOUS
  Administered 2018-09-06: 0.9 mg via INTRAVENOUS
  Filled 2018-09-01 (×2): qty 30

## 2018-09-01 MED ORDER — ACETAMINOPHEN 500 MG PO TABS
1000.0000 mg | ORAL_TABLET | Freq: Four times a day (QID) | ORAL | Status: DC
  Administered 2018-09-02 – 2018-09-06 (×17): 1000 mg via ORAL
  Filled 2018-09-01 (×15): qty 2

## 2018-09-01 MED ORDER — DEXMEDETOMIDINE HCL IN NACL 200 MCG/50ML IV SOLN
INTRAVENOUS | Status: DC | PRN
  Administered 2018-09-01: 60 ug via INTRAVENOUS
  Administered 2018-09-01 (×4): 20 ug via INTRAVENOUS

## 2018-09-01 MED ORDER — DIPHENHYDRAMINE HCL 50 MG/ML IJ SOLN: 12.5000 mg | Freq: Four times a day (QID) | INTRAMUSCULAR | Status: DC | PRN

## 2018-09-01 MED ORDER — PHENYLEPHRINE 40 MCG/ML (10ML) SYRINGE FOR IV PUSH (FOR BLOOD PRESSURE SUPPORT)
PREFILLED_SYRINGE | INTRAVENOUS | Status: AC
  Filled 2018-09-01: qty 10

## 2018-09-01 MED ORDER — MIDAZOLAM HCL 5 MG/5ML IJ SOLN
INTRAMUSCULAR | Status: DC | PRN
  Administered 2018-09-01 (×2): 1 mg via INTRAVENOUS

## 2018-09-01 MED ORDER — PROPOFOL 10 MG/ML IV BOLUS
INTRAVENOUS | Status: AC
  Filled 2018-09-01: qty 20

## 2018-09-01 MED ORDER — EPHEDRINE SULFATE-NACL 50-0.9 MG/10ML-% IV SOSY
PREFILLED_SYRINGE | INTRAVENOUS | Status: DC | PRN
  Administered 2018-09-01: 10 mg via INTRAVENOUS

## 2018-09-01 MED ORDER — MIDAZOLAM HCL 2 MG/2ML IJ SOLN
INTRAMUSCULAR | Status: AC
  Administered 2018-09-01: 18:00:00
  Filled 2018-09-01: qty 2

## 2018-09-01 MED ORDER — 0.9 % SODIUM CHLORIDE (POUR BTL) OPTIME
TOPICAL | Status: DC | PRN
  Administered 2018-09-01: 2000 mL

## 2018-09-01 MED ORDER — BISACODYL 5 MG PO TBEC
10.0000 mg | DELAYED_RELEASE_TABLET | Freq: Every day | ORAL | Status: DC
  Filled 2018-09-01 (×3): qty 2

## 2018-09-01 MED ORDER — NALOXONE HCL 0.4 MG/ML IJ SOLN: 0.4000 mg | INTRAMUSCULAR | Status: DC | PRN

## 2018-09-01 MED ORDER — MIDAZOLAM HCL 2 MG/2ML IJ SOLN
2.0000 mg | Freq: Once | INTRAMUSCULAR | Status: AC
  Administered 2018-09-01: 17:00:00 2 mg via INTRAVENOUS

## 2018-09-01 MED ORDER — SENNOSIDES-DOCUSATE SODIUM 8.6-50 MG PO TABS
1.0000 | ORAL_TABLET | Freq: Every day | ORAL | Status: DC
  Administered 2018-09-01 – 2018-09-03 (×3): 1 via ORAL
  Filled 2018-09-01 (×5): qty 1

## 2018-09-01 MED ORDER — ACETAMINOPHEN 160 MG/5ML PO SOLN: 1000.0000 mg | Freq: Four times a day (QID) | ORAL | Status: DC

## 2018-09-01 MED ORDER — EPHEDRINE 5 MG/ML INJ
INTRAVENOUS | Status: AC
  Filled 2018-09-01: qty 10

## 2018-09-01 MED ORDER — FENTANYL CITRATE (PF) 250 MCG/5ML IJ SOLN
INTRAMUSCULAR | Status: AC
  Filled 2018-09-01: qty 5

## 2018-09-01 MED ORDER — DIPHENHYDRAMINE HCL 12.5 MG/5ML PO ELIX
12.5000 mg | ORAL_SOLUTION | Freq: Four times a day (QID) | ORAL | Status: DC | PRN
  Filled 2018-09-01: qty 5

## 2018-09-01 MED ORDER — DEXMEDETOMIDINE HCL IN NACL 80 MCG/20ML IV SOLN
INTRAVENOUS | Status: AC
  Administered 2018-09-01: 18:00:00
  Filled 2018-09-01: qty 20

## 2018-09-01 MED ORDER — CEFAZOLIN SODIUM-DEXTROSE 2-4 GM/100ML-% IV SOLN: 2.0000 g | INTRAVENOUS | Status: DC

## 2018-09-01 MED ORDER — SUGAMMADEX SODIUM 200 MG/2ML IV SOLN
INTRAVENOUS | Status: DC | PRN
  Administered 2018-09-01: 420 mg via INTRAVENOUS

## 2018-09-01 MED ORDER — HYDROMORPHONE 1 MG/ML IV SOLN
INTRAVENOUS | Status: AC
  Filled 2018-09-01: qty 30

## 2018-09-01 MED ORDER — LACTATED RINGERS IV SOLN
INTRAVENOUS | Status: DC | PRN
  Administered 2018-09-01: 13:00:00 via INTRAVENOUS

## 2018-09-01 MED ORDER — SODIUM CHLORIDE 0.9% FLUSH: 9.0000 mL | INTRAVENOUS | Status: DC | PRN

## 2018-09-01 MED ORDER — PHENYLEPHRINE HCL-NACL 10-0.9 MG/250ML-% IV SOLN
INTRAVENOUS | Status: AC
  Filled 2018-09-01: qty 250

## 2018-09-01 MED ORDER — DEXAMETHASONE SODIUM PHOSPHATE 10 MG/ML IJ SOLN
INTRAMUSCULAR | Status: AC
  Filled 2018-09-01: qty 1

## 2018-09-01 SURGICAL SUPPLY — 70 items
ADH SKN CLS APL DERMABOND .7 (GAUZE/BANDAGES/DRESSINGS) ×2
BAG DECANTER FOR FLEXI CONT (MISCELLANEOUS) IMPLANT
CANISTER SUCT 3000ML PPV (MISCELLANEOUS) ×2 IMPLANT
CATH KIT ON Q 5IN SLV (PAIN MANAGEMENT) IMPLANT
CATH THORACIC 28FR (CATHETERS) ×1 IMPLANT
CATH THORACIC 28FR RT ANG (CATHETERS) IMPLANT
CATH THORACIC 36FR (CATHETERS) IMPLANT
CATH THORACIC 36FR RT ANG (CATHETERS) IMPLANT
CHERRY SPONGEY 1/2 (GAUZE/BANDAGES/DRESSINGS) IMPLANT
CONT SPEC 4OZ CLIKSEAL STRL BL (MISCELLANEOUS) ×4 IMPLANT
COVER SURGICAL LIGHT HANDLE (MISCELLANEOUS) ×2 IMPLANT
COVER WAND RF STERILE (DRAPES) ×1 IMPLANT
DERMABOND ADVANCED (GAUZE/BANDAGES/DRESSINGS) ×2
DERMABOND ADVANCED .7 DNX12 (GAUZE/BANDAGES/DRESSINGS) IMPLANT
DRAPE CV SPLIT W-CLR ANES SCRN (DRAPES) ×1 IMPLANT
DRAPE LAPAROSCOPIC ABDOMINAL (DRAPES) ×1 IMPLANT
DRAPE ORTHO SPLIT 77X108 STRL (DRAPES) ×2
DRAPE SLUSH/WARMER DISC (DRAPES) ×2 IMPLANT
DRAPE SURG ORHT 6 SPLT 77X108 (DRAPES) IMPLANT
ELECT BLADE 6.5 EXT (BLADE) ×2 IMPLANT
ELECT REM PT RETURN 9FT ADLT (ELECTROSURGICAL) ×2
ELECTRODE REM PT RTRN 9FT ADLT (ELECTROSURGICAL) ×1 IMPLANT
GAUZE SPONGE 4X4 12PLY STRL (GAUZE/BANDAGES/DRESSINGS) ×2 IMPLANT
GLOVE BIO SURGEON STRL SZ 6.5 (GLOVE) ×1 IMPLANT
GLOVE ORTHO TXT STRL SZ7.5 (GLOVE) ×3 IMPLANT
GOWN STRL REUS W/ TWL LRG LVL3 (GOWN DISPOSABLE) ×2 IMPLANT
GOWN STRL REUS W/TWL LRG LVL3 (GOWN DISPOSABLE) ×10
HEMOSTAT SURGICEL 2X14 (HEMOSTASIS) IMPLANT
KIT BASIN OR (CUSTOM PROCEDURE TRAY) ×2 IMPLANT
KIT SUCTION CATH 14FR (SUCTIONS) ×2 IMPLANT
KIT TURNOVER KIT B (KITS) ×2 IMPLANT
NS IRRIG 1000ML POUR BTL (IV SOLUTION) ×4 IMPLANT
PACK CHEST (CUSTOM PROCEDURE TRAY) ×2 IMPLANT
PAD ARMBOARD 7.5X6 YLW CONV (MISCELLANEOUS) ×5 IMPLANT
RELOAD STAPLE 60 3.8 GOLD REG (STAPLE) IMPLANT
RELOAD STAPLER GOLD 60MM (STAPLE) ×3 IMPLANT
SEALANT SURG COSEAL 4ML (VASCULAR PRODUCTS) IMPLANT
SOLUTION ANTI FOG 6CC (MISCELLANEOUS) ×2 IMPLANT
SPECIMEN JAR LG PLASTIC EMPTY (MISCELLANEOUS) IMPLANT
SPECIMEN JAR MEDIUM (MISCELLANEOUS) ×1 IMPLANT
STAPLE ECHEON FLEX 60 POW ENDO (STAPLE) ×1 IMPLANT
STAPLER RELOAD GOLD 60MM (STAPLE) ×6
STAPLER VISISTAT 35W (STAPLE) IMPLANT
SUT PROLENE 3 0 SH DA (SUTURE) IMPLANT
SUT PROLENE 4 0 RB 1 (SUTURE)
SUT PROLENE 4-0 RB1 .5 CRCL 36 (SUTURE) IMPLANT
SUT SILK  1 MH (SUTURE) ×2
SUT SILK 1 MH (SUTURE) ×1 IMPLANT
SUT SILK 2 0SH CR/8 30 (SUTURE) IMPLANT
SUT SILK 3 0SH CR/8 30 (SUTURE) IMPLANT
SUT VIC AB 2-0 CT1 27 (SUTURE)
SUT VIC AB 2-0 CT1 TAPERPNT 27 (SUTURE) IMPLANT
SUT VIC AB 2-0 CTX 36 (SUTURE) ×2 IMPLANT
SUT VIC AB 2-0 UR6 27 (SUTURE) ×1 IMPLANT
SUT VIC AB 3-0 MH 27 (SUTURE) IMPLANT
SUT VIC AB 3-0 SH 27 (SUTURE) ×2
SUT VIC AB 3-0 SH 27X BRD (SUTURE) IMPLANT
SUT VICRYL 0 UR6 27IN ABS (SUTURE) ×2 IMPLANT
SUT VICRYL 2 TP 1 (SUTURE) IMPLANT
SWAB COLLECTION DEVICE MRSA (MISCELLANEOUS) IMPLANT
SWAB CULTURE ESWAB REG 1ML (MISCELLANEOUS) IMPLANT
SYSTEM SAHARA CHEST DRAIN ATS (WOUND CARE) ×2 IMPLANT
TAPE CLOTH 4X10 WHT NS (GAUZE/BANDAGES/DRESSINGS) ×2 IMPLANT
TAPE CLOTH SURG 4X10 WHT LF (GAUZE/BANDAGES/DRESSINGS) ×1 IMPLANT
TOWEL GREEN STERILE (TOWEL DISPOSABLE) ×2 IMPLANT
TOWEL GREEN STERILE FF (TOWEL DISPOSABLE) ×2 IMPLANT
TRAP SPECIMEN MUCOUS 40CC (MISCELLANEOUS) IMPLANT
TRAY FOLEY MTR SLVR 14FR STAT (SET/KITS/TRAYS/PACK) IMPLANT
TUNNELER SHEATH ON-Q 11GX8 DSP (PAIN MANAGEMENT) IMPLANT
WATER STERILE IRR 1000ML POUR (IV SOLUTION) ×3 IMPLANT

## 2018-09-01 NOTE — Progress Notes (Signed)
Patient back from OR at this time. Chest tube dressing dry and intact. Connected to wall suction per MD order. A Line in right radial. Right AC sl and Right UA running LR at 75 ml hr. PCA pumped given to patient and encouraged to use as needed. Assessment and v/s complete. Will continue to monitor.   Emelda Fear, RN

## 2018-09-01 NOTE — Progress Notes (Addendum)
[address]       [place] [address]             [phone]      Subjective: Had some acute chest pain yesterday that resolved. Otherwise doing well, no new problems.   Objective: Vital signs in last 24 hours: Temp:  [97.4 F (36.3 C)-98.4 F (36.9 C)] 97.6 F (36.4 C) (06/18 0827) Pulse Rate:  [63-97] 63 (06/18 0404) Cardiac Rhythm: Normal sinus rhythm (06/18 0700) Resp:  [12-23] 14 (06/18 0827) BP: (101-140)/(69-100) 139/87 (06/18 0827) SpO2:  [90 %-98 %] 96 % (06/18 0827)      Intake/Output from previous day: 06/17 0701 - 06/18 0700 In: 340 [P.O.:240] Out: 835 [Urine:800; Chest Tube:35] Intake/Output this shift: Total I/O In: 240 [P.O.:240] Out: 300 [Urine:300]  General appearance:alert, cooperative and no distress Neurologic:intact Heart:regular rate and rhythm, no significant arrhythmias on monitor Lungs:Breath sounds are clear. Air leak is larger today.  CT of chest done this am has not yet resulted but shows a PTX much larger than what is demonstrated on the plain CXR. There are several apical blebs on the right medially that are not in apposition to the parietal pleura.  Wound:the CT is covered with a dry dressing and connected to the PleurEvac at -20cmH2O suction.  All connections are secure.  Lab Results: Recent Labs    08/29/18 1201  WBC 7.8  HGB 14.5  HCT 43.2  PLT 320   BMET:  Recent Labs    08/29/18 1201 08/30/18 0416  NA 139 138  K 3.3* 3.6  CL 105 105  CO2 25 24  GLUCOSE 118* 107*  BUN 16 15  CREATININE 1.23 1.07  CALCIUM 9.1 8.6*    PT/INR:  Recent Labs    08/29/18 1455  LABPROT 12.9  INR 1.0   ABG    Component Value Date/Time   PHART 7.392 01/04/2013 0450   HCO3 25.9 (H) 01/04/2013 0450   TCO2 23 09/11/2014 1751   O2SAT 95.8 01/04/2013 0450   CBG (last 3)  No results for input(s): GLUCAP in the last 72 hours.  Assessment/Plan:  -Recurrent spontaneous right PTX, first re-occurrence  since VATS in 2014 for the same Dx.  Right pleural pigtail pleural tube placed by IR 08/29/18. CT of chest this AM demonstrates a moderate to large PTX with several apical blebs. Active air leak persists.  Keep CT to suction for now.   -History of HTN- controlled with his usual medications including clonidine and amlodipine.  -History of hypothyrodism following thyroidectomy for for cancer. Synthroid resumed.   -DVT PPX-continue enoxaparin, encourage ambulation.    LOS: 3 days    Antony Odea, PA-C [phone] 09/01/2018    I have seen and examined the patient and agree with the assessment and plan as outlined.  Mr. Alesia Richards has persistent air leak and residual right pneumothorax with existing chest tube in place.  I discussed treatment options at length with the patient at the bedside including continued observation versus placement of a second large-bore chest tube versus proceeding with surgical intervention for redo right video-assisted thoracoscopy for bleb resection and mechanical pleurodesis.  The patient prefers that we proceed directly to surgery for more definitive intervention.  He understands and accepts all potential associated risks of surgery including but not limited to risk of death, myocardial infarction, pulmonary embolus, respiratory failure, pneumonia, bleeding requiring blood transfusion, persistent air leak, prolonged chest wall discomfort, or late recurrence of spontaneous  pneumothorax.  All questions answered.  The patient reports that he ate breakfast at approximately 6 AM this morning.  We will wait until early this afternoon before proceeding with surgery.  Rexene Alberts, MD 09/01/2018 10:40 AM

## 2018-09-01 NOTE — Anesthesia Postprocedure Evaluation (Signed)
Anesthesia Post Note  Patient: Nicholas Mccarty  Procedure(s) Performed: RIGHT VIDEO ASSISTED THORACOSCOPY WITH STAPLING OF BLEBS AND PLEURODESIS (Right Chest)     Patient location during evaluation: PACU Anesthesia Type: General Level of consciousness: awake and alert Pain management: pain level controlled Vital Signs Assessment: post-procedure vital signs reviewed and stable Respiratory status: spontaneous breathing, nonlabored ventilation, respiratory function stable and patient connected to nasal cannula oxygen Cardiovascular status: blood pressure returned to baseline and stable Postop Assessment: no apparent nausea or vomiting Anesthetic complications: no    Last Vitals:  Vitals:   09/01/18 1708 09/01/18 1743  BP: (!) 146/70 116/83  Pulse: 74 70  Resp: 17 11  Temp:  36.7 C  SpO2: 97% 97%    Last Pain:  Vitals:   09/01/18 1743  TempSrc: Axillary  PainSc:                  [name]

## 2018-09-01 NOTE — Progress Notes (Signed)
Chest tube to water seal at this time per MD order.

## 2018-09-01 NOTE — Transfer of Care (Signed)
Immediate Anesthesia Transfer of Care Note  Patient: Nicholas Mccarty  Procedure(s) Performed: RIGHT VIDEO ASSISTED THORACOSCOPY WITH STAPLING OF BLEBS AND PLEURODESIS (Right Chest)  Patient Location: PACU  Anesthesia Type:General  Level of Consciousness: awake and patient cooperative  Airway & Oxygen Therapy: Patient Spontanous Breathing  Post-op Assessment: Report given to RN and Post -op Vital signs reviewed and stable  Post vital signs: Reviewed and stable  Last Vitals:  Vitals Value Taken Time  BP 173/125 09/01/18 1610  Temp    Pulse 73 09/01/18 1612  Resp 13 09/01/18 1612  SpO2 91 % 09/01/18 1612  Vitals shown include unvalidated device data.  Last Pain:  Vitals:   09/01/18 0936  TempSrc:   PainSc: 5       Patients Stated Pain Goal: 0 (56/38/75 6433)  Complications: No apparent anesthesia complications

## 2018-09-01 NOTE — Anesthesia Preprocedure Evaluation (Addendum)
Anesthesia Evaluation  Patient identified by MRN, date of birth, ID band Patient awake    Reviewed: Allergy & Precautions, NPO status , Patient's Chart, lab work & pertinent test results  Airway Mallampati: II  TM Distance: >3 FB     Dental  (+) Dental Advisory Given   Pulmonary former smoker,    breath sounds clear to auscultation       Cardiovascular hypertension,  Rhythm:Regular Rate:Normal     Neuro/Psych    GI/Hepatic Neg liver ROS, GERD  ,  Endo/Other  Hypothyroidism   Renal/GU negative Renal ROS     Musculoskeletal   Abdominal   Peds  Hematology   Anesthesia Other Findings   Reproductive/Obstetrics                            Anesthesia Physical Anesthesia Plan  ASA: III  Anesthesia Plan: General   Post-op Pain Management:    Induction: Intravenous  PONV Risk Score and Plan: 2 and Ondansetron, Midazolam and Dexamethasone  Airway Management Planned: Oral ETT  Additional Equipment:   Intra-op Plan:   Post-operative Plan: Extubation in OR  Informed Consent: I have reviewed the patients History and Physical, chart, labs and discussed the procedure including the risks, benefits and alternatives for the proposed anesthesia with the patient or authorized representative who has indicated his/her understanding and acceptance.       Plan Discussed with: CRNA and Anesthesiologist  Anesthesia Plan Comments:         Anesthesia Quick Evaluation

## 2018-09-01 NOTE — Anesthesia Procedure Notes (Signed)
Procedure Name: Intubation Date/Time: 09/01/2018 1:20 PM Performed by: Lavell Luster, CRNA Pre-anesthesia Checklist: Patient identified, Emergency Drugs available, Suction available, Patient being monitored and Timeout performed Patient Re-evaluated:Patient Re-evaluated prior to induction Oxygen Delivery Method: Circle system utilized Preoxygenation: Pre-oxygenation with 100% oxygen Induction Type: Rapid sequence and IV induction Laryngoscope Size: Mac and 4 Grade View: Grade II Tube type: Oral Endobronchial tube: Left and 39 Fr Number of attempts: 1 Airway Equipment and Method: Stylet Placement Confirmation: ETT inserted through vocal cords under direct vision,  positive ETCO2 and breath sounds checked- equal and bilateral Tube secured with: Tape Dental Injury: Teeth and Oropharynx as per pre-operative assessment

## 2018-09-01 NOTE — Progress Notes (Signed)
Chest tube back to wall suction per MD order.

## 2018-09-01 NOTE — Anesthesia Procedure Notes (Signed)
Arterial Line Insertion Start/End6/18/2020 12:30 PM, 09/01/2018 12:35 PM Performed by: Lavell Luster, CRNA, CRNA  Preanesthetic checklist: patient identified, IV checked, risks and benefits discussed, surgical consent, pre-op evaluation and timeout performed Lidocaine 1% used for infiltration Left, radial was placed Catheter size: 20 G Hand hygiene performed , maximum sterile barriers used  and Seldinger technique used Allen's test indicative of satisfactory collateral circulation Attempts: 1 Procedure performed without using ultrasound guided technique. Following insertion, Biopatch and dressing applied. Post procedure assessment: normal  Patient tolerated the procedure well with no immediate complications.

## 2018-09-01 NOTE — Op Note (Addendum)
CARDIOTHORACIC SURGERY OPERATIVE NOTE  Date of Procedure:   09/01/2018  Preoperative Diagnosis:  Recurrent Right Spontaneous Pneumothorax  Postoperative Diagnosis:  same  Procedure:    Redo Right Video-assisted Thoracoscopy for Apical Bleb Resection and Mechanical Pleurodesis   Surgeon:    Valentina Gu. Roxy Manns, MD  Assistant:    Enid Cutter, PA-C  Anesthesia:    Roberts Gaudy, MD    BRIEF CLINICAL NOTE AND INDICATIONS FOR SURGERY  Patient is a 48 year old African-American male with history of tobacco use, crack cocaine use, alcohol use and recurrent right spontaneous pneumothorax for which the patient underwent right video-assisted thoracoscopy in October 2014 by Dr. Servando Snare was admitted to the hospital August 29, 2018 with another recurrent right spontaneous pneumothorax.  The patient's pneumothorax was initially treated with chest tube placement but the patient has had persistent air leak and incomplete reexpansion of the right lung for several days.  Noncontrast CT scan of the chest reveals apical subpleural blebs.  The patient has been counseled at length regarding the indications, risks and potential benefits of surgery.  All questions have been answered, and the patient provides full informed consent for the operation as described.      DETAILS OF THE OPERATIVE PROCEDURE  The patient is brought to the operating room on the above mentioned date And placed in the supine position on the operating table. Radial arterial line is placed by the anesthesia team. Intravenous antibiotics are administered and pneumatic sequential compression boots are placed on both lower extremities. General endotracheal anesthesia is induced uneventfully.  The patient was intubated using a dual lumen endotracheal tube. The patient is turned to the left lateral decubitus position.  The pre-existing chest tube on the right side is removed.  An axillary roll is placed and the patient's right chest was prepared  and draped in a sterile manner.  A timeout procedure was performed.  A small incision is made overlying the seventh intercostal space and the anterior axillary line. The incision is completed into the pleural space with electrocautery.  A 10 mm port is passed through the incision and a video thoracoscopic camera is passed through the port.  The right chest was explored visually.  The lung is collapsed but there remain adhesions between the lateral surface of the right upper lobe and the right middle lobe and the chest wall.  A second port incision is placed posteriorly and a mini thoracotomy incision is placed in the posterior axillary line overlying the fifth intercostal space.  Through the ports and minithoracotomy incision the remainder of the surgical procedure is completed.  Under video thoracoscopic guidance all pleural adhesions are divided with electrocautery until the entire lung is mobilized.  Once this is completed the right upper lobe is pulled down to expose the apex.  Several subpleural blebs are identified including one that appears ruptured.  A total of 2 small wedge resections of the right lung apex are removed using battery-powered Echelon stapler.  The lung is carefully examined and no other obvious subpleural blebs are identified.  Mechanical pleurodesis is performed by essentially removing the majority of the parietal pleura with blunt dissection.  A cautery scratchpad is also utilized to abrade the surface of the parietal pleura.  This is done circumferentially throughout the right pleural space.  Adequate hemostasis is achieved.  The right pleural space is drained using a single 28 French chest tube placed through the anterior port incision.  The remaining port incision and the minithoracotomy incision are closed in  multiple layers.  Skin incisions are closed with subcuticular skin closure.  The patient tolerated the procedure well, was extubated in the operative room, and  transported to the postanesthesia care in stable condition. All sponge instrument and needle counts are verified correct. There were no intraoperative complications. Estimated blood loss was less than 50 mL.      Valentina Gu. Roxy Manns MD 09/01/2018 3:31 PM

## 2018-09-01 NOTE — Plan of Care (Signed)
progressing 

## 2018-09-02 ENCOUNTER — Encounter (HOSPITAL_COMMUNITY): Payer: Self-pay | Admitting: Thoracic Surgery (Cardiothoracic Vascular Surgery)

## 2018-09-02 LAB — CBC
HCT: 40 % (ref 39.0–52.0)
Hemoglobin: 13.7 g/dL (ref 13.0–17.0)
MCH: 29.9 pg (ref 26.0–34.0)
MCHC: 34.3 g/dL (ref 30.0–36.0)
MCV: 87.3 fL (ref 80.0–100.0)
Platelets: 295 10*3/uL (ref 150–400)
RBC: 4.58 MIL/uL (ref 4.22–5.81)
RDW: 13.2 % (ref 11.5–15.5)
WBC: 12.1 10*3/uL — ABNORMAL HIGH (ref 4.0–10.5)
nRBC: 0 % (ref 0.0–0.2)

## 2018-09-02 LAB — BASIC METABOLIC PANEL
Anion gap: 11 (ref 5–15)
BUN: 13 mg/dL (ref 6–20)
CO2: 24 mmol/L (ref 22–32)
Calcium: 8.5 mg/dL — ABNORMAL LOW (ref 8.9–10.3)
Chloride: 102 mmol/L (ref 98–111)
Creatinine, Ser: 1.24 mg/dL (ref 0.61–1.24)
GFR calc Af Amer: >60 mL/min (ref >60)
GFR calc non Af Amer: >60 mL/min (ref >60)
Glucose, Bld: 118 mg/dL — ABNORMAL HIGH (ref 70–99)
Potassium: 3.8 mmol/L (ref 3.5–5.1)
Sodium: 137 mmol/L (ref 135–145)

## 2018-09-02 NOTE — Progress Notes (Addendum)
1 Day Post-Op Procedure(s) (LRB): RIGHT VIDEO ASSISTED THORACOSCOPY WITH STAPLING OF BLEBS AND PLEURODESIS (Right) Subjective: Says he feels good, pain controlled. No shortness of breath.   Objective: Vital signs in last 24 hours: Temp:  [97.8 F (36.6 C)-99.5 F (37.5 C)] 99.5 F (37.5 C) (06/19 0400) Pulse Rate:  [66-85] 74 (06/19 0400) Cardiac Rhythm: Normal sinus rhythm (06/19 0700) Resp:  [8-24] 19 (06/19 0730) BP: (101-173)/(55-125) 128/78 (06/19 0400) SpO2:  [93 %-100 %] 100 % (06/19 0730) Arterial Line BP: (133-166)/(73-98) 156/90 (06/18 2000) FiO2 (%):  [2 %] 2 % (06/18 2000) Weight:  [320 kg] 103 kg (06/18 1224)   General appearance:alert, cooperative and no distress Neurologic:intact Heart:regular rate and rhythm, no significant arrhythmias on monitor Lungs:Breath sounds are clear.  CXR shows good expansion of both lungs with good aeration. No air leak. CT drainage 169ml past 12 hours. .  Wound:The chest incision is covered with a dry dressing, The CT is connected to the PleurEvac at -20cmH2O suction.  All connections are secure.   Intake/Output from previous day: 06/18 0701 - 06/19 0700 In: 2620 [P.O.:720; I.V.:1900] Out: 3545 [Urine:2955; Blood:200; Chest Tube:390] Intake/Output this shift: No intake/output data recorded.    Lab Results: Recent Labs    09/02/18 0333  WBC 12.1*  HGB 13.7  HCT 40.0  PLT 295   BMET:  Recent Labs    09/02/18 0333  NA 137  K 3.8  CL 102  CO2 24  GLUCOSE 118*  BUN 13  CREATININE 1.24  CALCIUM 8.5*    PT/INR: No results for input(s): LABPROT, INR in the last 72 hours. ABG    Component Value Date/Time   PHART 7.392 01/04/2013 0450   HCO3 25.9 (H) 01/04/2013 0450   TCO2 23 09/11/2014 1751   O2SAT 95.8 01/04/2013 0450   CBG (last 3)  No results for input(s): GLUCAP in the last 72 hours.  Assessment/Plan: S/P Procedure(s) (LRB): RIGHT VIDEO ASSISTED THORACOSCOPY WITH STAPLING OF BLEBS AND PLEURODESIS  (Right)    LOS: 4 days   -POD1 re-do VATS for right apical bleb resection and  mechanical pleurodesis for recurrent spontaneous right PTX, first re-occurrence since VATS in 2014 for the same Dx.  No air leak is seen today. CT drainage 138ml past 24  Hours. CXR show good expansion of the right lung.  Will leave CT to suction for now to optimize effect of pleurodesis.  Continue PCA for pain mgt.  -History of HTN- controlled with his usual medications including clonidine and amlodipine.  -History of hypothyrodism following thyroidectomy for for cancer. Synthroid resumed.   Antony Odea, PA-C [phone] 09/02/2018  I have seen and examined the patient and agree with the assessment and plan as outlined.  Doing well.  CXR looks good and no air leak noted.  Rexene Alberts, MD 09/02/2018 1:00 PM

## 2018-09-03 ENCOUNTER — Inpatient Hospital Stay (HOSPITAL_COMMUNITY): Payer: Self-pay

## 2018-09-03 LAB — CBC
HCT: 37.2 % — ABNORMAL LOW (ref 39.0–52.0)
Hemoglobin: 12.8 g/dL — ABNORMAL LOW (ref 13.0–17.0)
MCH: 30 pg (ref 26.0–34.0)
MCHC: 34.4 g/dL (ref 30.0–36.0)
MCV: 87.1 fL (ref 80.0–100.0)
Platelets: 289 10*3/uL (ref 150–400)
RBC: 4.27 MIL/uL (ref 4.22–5.81)
RDW: 13.1 % (ref 11.5–15.5)
WBC: 13.4 10*3/uL — ABNORMAL HIGH (ref 4.0–10.5)
nRBC: 0 % (ref 0.0–0.2)

## 2018-09-03 LAB — BASIC METABOLIC PANEL
Anion gap: 10 (ref 5–15)
BUN: 10 mg/dL (ref 6–20)
CO2: 28 mmol/L (ref 22–32)
Calcium: 8.5 mg/dL — ABNORMAL LOW (ref 8.9–10.3)
Chloride: 100 mmol/L (ref 98–111)
Creatinine, Ser: 1.16 mg/dL (ref 0.61–1.24)
GFR calc Af Amer: >60 mL/min (ref >60)
GFR calc non Af Amer: >60 mL/min (ref >60)
Glucose, Bld: 134 mg/dL — ABNORMAL HIGH (ref 70–99)
Potassium: 3.4 mmol/L — ABNORMAL LOW (ref 3.5–5.1)
Sodium: 138 mmol/L (ref 135–145)

## 2018-09-03 MED ORDER — POTASSIUM CHLORIDE 20 MEQ PO PACK
40.0000 meq | PACK | Freq: Once | ORAL | Status: AC
  Administered 2018-09-03: 11:00:00 40 meq via ORAL
  Filled 2018-09-03: qty 2

## 2018-09-03 MED ORDER — LEVALBUTEROL HCL 0.63 MG/3ML IN NEBU
0.6300 mg | INHALATION_SOLUTION | Freq: Three times a day (TID) | RESPIRATORY_TRACT | Status: DC
  Administered 2018-09-03 – 2018-09-05 (×7): 0.63 mg via RESPIRATORY_TRACT
  Filled 2018-09-03 (×8): qty 3

## 2018-09-03 NOTE — Progress Notes (Signed)
Syringe replaced in PCA Pump.  3cc of Dilaudid wasted with Livia Snellen.

## 2018-09-03 NOTE — Progress Notes (Addendum)
[address]       [place] [address]             [phone]      2 Days Post-Op Procedure(s) (LRB): RIGHT VIDEO ASSISTED THORACOSCOPY WITH STAPLING OF BLEBS AND PLEURODESIS (Right) Subjective: Feels pretty well , had some anxiety yesterday, no significant SOB  Objective: Vital signs in last 24 hours: Temp:  [98.2 F (36.8 C)-99.4 F (37.4 C)] 99.4 F (37.4 C) (06/20 0407) Pulse Rate:  [74-103] 103 (06/19 2359) Cardiac Rhythm: Normal sinus rhythm (06/20 0700) Resp:  [11-23] 11 (06/20 0407) BP: (110-138)/(63-105) 131/63 (06/20 0407) SpO2:  [94 %-100 %] 99 % (06/20 0400)  Hemodynamic parameters for last 24 hours:    Intake/Output from previous day: 06/19 0701 - 06/20 0700 In: 606 [P.O.:600; I.V.:6] Out: 1700 [Urine:1700] Intake/Output this shift: No intake/output data recorded.  General appearance: alert, cooperative and no distress Heart: regular rate and rhythm and tachy Lungs: some scattered insp and exp wheeze, mildly dim right base Abdomen: benign Extremities: no  edema or calf tenderness Wound: incis healing well  Lab Results: Recent Labs    09/02/18 0333 09/03/18 0252  WBC 12.1* 13.4*  HGB 13.7 12.8*  HCT 40.0 37.2*  PLT 295 289   BMET:  Recent Labs    09/02/18 0333 09/03/18 0252  NA 137 138  K 3.8 3.4*  CL 102 100  CO2 24 28  GLUCOSE 118* 134*  BUN 13 10  CREATININE 1.24 1.16  CALCIUM 8.5* 8.5*    PT/INR: No results for input(s): LABPROT, INR in the last 72 hours. ABG    Component Value Date/Time   PHART 7.392 01/04/2013 0450   HCO3 25.9 (H) 01/04/2013 0450   TCO2 23 09/11/2014 1751   O2SAT 95.8 01/04/2013 0450   CBG (last 3)  No results for input(s): GLUCAP in the last 72 hours.  Meds Scheduled Meds: . acetaminophen  1,000 mg Oral Q6H   Or  . acetaminophen (TYLENOL) oral liquid 160 mg/5 mL  1,000 mg Oral Q6H  . allopurinol  100 mg Oral Daily  . amLODipine  10 mg Oral Daily  . bisacodyl  10 mg Oral  Daily  . cloNIDine  0.2 mg Oral BID  . colchicine  0.6 mg Oral BID  . docusate sodium  100 mg Oral Daily  . famotidine  20 mg Oral BID  . fluticasone  2 spray Each Nare Daily  . HYDROmorphone   Intravenous Q4H  . ipratropium  2 puff Inhalation Once  . levothyroxine  150 mcg Oral Q0600  . lidocaine (PF)  5 mL Other Once  . loratadine  10 mg Oral Daily  . montelukast  10 mg Oral QHS  . senna-docusate  1 tablet Oral QHS  . sodium chloride flush  3 mL Intravenous Q12H  . sodium chloride flush  3 mL Intravenous Q12H   Continuous Infusions: . sodium chloride     PRN Meds:.sodium chloride, albuterol, alum & mag hydroxide-simeth, bisacodyl, diphenhydrAMINE **OR** diphenhydrAMINE, hydrALAZINE, naloxone **AND** sodium chloride flush, ondansetron **OR** ondansetron (ZOFRAN) IV, oxyCODONE, sodium chloride flush, traMADol, traZODone  Xrays Ct Chest Wo Contrast  Result Date: 09/01/2018 CLINICAL DATA:  48 year old male with persistent RIGHT pneumothorax. Evaluate for blebs. EXAM: CT CHEST WITHOUT CONTRAST TECHNIQUE: Multidetector CT imaging of the chest was performed following the standard protocol without IV contrast. COMPARISON:  09/01/2018 and prior CT FINDINGS: Cardiovascular: Heart size appears normal. There is no evidence of thoracic  aortic aneurysm or pericardial effusion. Mediastinum/Nodes: No enlarged mediastinal or axillary lymph nodes. Thyroid gland, trachea, and esophagus demonstrate no significant findings. Lungs/Pleura: A moderate to large RIGHT pneumothorax is noted. A percutaneous RIGHT pigtail thoracostomy catheter is present within the LOWER RIGHT pleural space. There are at least 4 blebs each measuring 1-1.5 cm along the MEDIAL apical RIGHT UPPER lobe. Dependent and basilar atelectasis with mid and LOWER RIGHT lung noted. Moderate RIGHT chest subcutaneous emphysema noted. The LEFT lung is clear. Upper Abdomen: No acute abnormality. Musculoskeletal: No acute or suspicious bony  abnormalities. IMPRESSION: 1. Moderate to large RIGHT pneumothorax with percutaneous RIGHT pigtail thoracostomy catheter in the LOWER RIGHT pleural space. Four blebs each measuring 1-1.5 cm along the MEDIAL apical RIGHT UPPER lobe. Moderate RIGHT subcutaneous emphysema. 2. Scattered RIGHT lung atelectasis. Electronically Signed   By: Margarette Canada M.D.   On: 09/01/2018 10:58   Dg Chest Port 1 View  Result Date: 09/01/2018 CLINICAL DATA:  Pneumothorax EXAM: PORTABLE CHEST 1 VIEW COMPARISON:  09/01/2018, 08/31/2018 FINDINGS: Removal of right lower chest tube. Placement of right ascending chest tube with tip over the right upper lung. Decreased right pneumothorax. A pickle surgical changes. Probable small loculated pneumothorax along the mediastinum. Subsegmental atelectasis at both bases. Right lower chest wall emphysema. Normal heart size. IMPRESSION: 1. Removal of right lower chest drainage catheter with placement of ascending chest drainage catheter with tip over the right upper lung. Decreased right pneumothorax with probable small residual medially. 2. Subsegmental atelectasis at both bases Electronically Signed   By: Donavan Foil M.D.   On: 09/01/2018 20:04    Assessment/Plan: S/P Procedure(s) (LRB): RIGHT VIDEO ASSISTED THORACOSCOPY WITH STAPLING OF BLEBS AND PLEURODESIS (Right)  1 hemodyn stable with some reactive minor sinus tachy, tmax 99.4, sats good on RA 2 will add xopenex , has hx of asthma, having some wheeze. Add flutter valve 3 no air leak, CXR shows no pntx, small effusion on left- will place to H2O seal today- cont PCA for now 4 H/H down a little, CT drainage does not look bloody 5 mild reactive leukocytosis appropriate for systemic inflammatory response, especially with pleurodesis, monitor 6 renal fxn normal, K+ a little low- will replace     LOS: 5 days    Nicholas Mccarty 09/03/2018 Pager [phone]  I have seen and examined the patient and agree with the assessment  and plan as outlined.  Chest tube to water seal.  Tentatively plan to remove chest tube on Monday 6/22 if CXR remains stable and no signs of air leak.  Rexene Alberts, MD 09/03/2018 10:41 AM

## 2018-09-04 ENCOUNTER — Inpatient Hospital Stay (HOSPITAL_COMMUNITY): Payer: Self-pay

## 2018-09-04 NOTE — Progress Notes (Addendum)
[address]       [place] [address]             [phone]      3 Days Post-Op Procedure(s) (LRB): RIGHT VIDEO ASSISTED THORACOSCOPY WITH STAPLING OF BLEBS AND PLEURODESIS (Right) Subjective: conts to feel well, no SOB  Objective: Vital signs in last 24 hours: Temp:  [98.5 F (36.9 C)-99.6 F (37.6 C)] 99.6 F (37.6 C) (06/21 0408) Pulse Rate:  [92-104] 93 (06/21 0408) Cardiac Rhythm: Normal sinus rhythm (06/21 0700) Resp:  [12-20] 14 (06/21 0408) BP: (95-133)/(69-92) 107/73 (06/21 0408) SpO2:  [91 %-99 %] 92 % (06/21 0408)  Hemodynamic parameters for last 24 hours:    Intake/Output from previous day: 06/20 0701 - 06/21 0700 In: 240 [P.O.:240] Out: 2210 [Urine:2140; Chest Tube:70] Intake/Output this shift: Total I/O In: 354 [P.O.:354] Out: -   General appearance: alert, cooperative and no distress Heart: regular rate and rhythm Lungs: clear to auscultation bilaterally Abdomen: benign Extremities: no edema or calf tenderness Wound: incis healing well  Lab Results: Recent Labs    09/02/18 0333 09/03/18 0252  WBC 12.1* 13.4*  HGB 13.7 12.8*  HCT 40.0 37.2*  PLT 295 289   BMET:  Recent Labs    09/02/18 0333 09/03/18 0252  NA 137 138  K 3.8 3.4*  CL 102 100  CO2 24 28  GLUCOSE 118* 134*  BUN 13 10  CREATININE 1.24 1.16  CALCIUM 8.5* 8.5*    PT/INR: No results for input(s): LABPROT, INR in the last 72 hours. ABG    Component Value Date/Time   PHART 7.392 01/04/2013 0450   HCO3 25.9 (H) 01/04/2013 0450   TCO2 23 09/11/2014 1751   O2SAT 95.8 01/04/2013 0450   CBG (last 3)  No results for input(s): GLUCAP in the last 72 hours.  Meds Scheduled Meds: . acetaminophen  1,000 mg Oral Q6H   Or  . acetaminophen (TYLENOL) oral liquid 160 mg/5 mL  1,000 mg Oral Q6H  . allopurinol  100 mg Oral Daily  . amLODipine  10 mg Oral Daily  . bisacodyl  10 mg Oral Daily  . cloNIDine  0.2 mg Oral BID  . colchicine  0.6 mg Oral BID   . docusate sodium  100 mg Oral Daily  . famotidine  20 mg Oral BID  . fluticasone  2 spray Each Nare Daily  . HYDROmorphone   Intravenous Q4H  . ipratropium  2 puff Inhalation Once  . levalbuterol  0.63 mg Nebulization TID  . levothyroxine  150 mcg Oral Q0600  . lidocaine (PF)  5 mL Other Once  . loratadine  10 mg Oral Daily  . montelukast  10 mg Oral QHS  . senna-docusate  1 tablet Oral QHS  . sodium chloride flush  3 mL Intravenous Q12H  . sodium chloride flush  3 mL Intravenous Q12H   Continuous Infusions: . sodium chloride     PRN Meds:.sodium chloride, albuterol, alum & mag hydroxide-simeth, bisacodyl, diphenhydrAMINE **OR** diphenhydrAMINE, hydrALAZINE, naloxone **AND** sodium chloride flush, ondansetron **OR** ondansetron (ZOFRAN) IV, oxyCODONE, sodium chloride flush, traMADol, traZODone  Xrays Dg Chest Port 1 View  Result Date: 09/03/2018 CLINICAL DATA:  Follow-up pneumothorax. EXAM: PORTABLE CHEST 1 VIEW COMPARISON:  09/01/2018 FINDINGS: Right apical chest tube unchanged. No evidence of right-sided pneumothorax. Blunting of the right costophrenic angle likely worsening small right effusion. Left lung is clear. Cardiomediastinal silhouette and remainder of the exam is unchanged. IMPRESSION: Worsening small  right-sided pleural effusion. Right-sided chest tube unchanged. No evidence of residual right pneumothorax. Electronically Signed   By: Marin Olp M.D.   On: 09/03/2018 14:35    Assessment/Plan: S/P Procedure(s) (LRB): RIGHT VIDEO ASSISTED THORACOSCOPY WITH STAPLING OF BLEBS AND PLEURODESIS (Right)  1 hemodyn stable in sinus rhythm, Tmax 99.6 2 sats good on RA 3 no air leak, 70 cc drainage from CT yesterday 4 CXR shows increase in R effusion, hopefully not coagulated blood, no pntx- keep CT to H2O seal - poss removal tomorrow 5 repeat labs in am  LOS: 6 days    John [facility] 09/04/2018 Pager [phone]  I have seen and examined the patient and agree with  the assessment and plan as outlined.  Slight increased opacity right costophrenic angle c/w effusion vs atelectasis is NOT concerning.   Will plan to d/c tube tomorrow if CXR stable and no air leak  Rexene Alberts, MD 09/04/2018 11:13 AM

## 2018-09-04 NOTE — Progress Notes (Signed)
NT ran down to cafeteria voluntarily as patient unhappy with breakfast brought by nutrition (with approval from RN, pt on regular diet). 2 scrambled eggs, 2 bacon, and biscuit. Patient grateful and appreciative. Pt gave NT $20 (breakfast $3.45) and brought back $16.55 in change to pt.

## 2018-09-05 ENCOUNTER — Inpatient Hospital Stay (HOSPITAL_COMMUNITY): Payer: Self-pay

## 2018-09-05 LAB — CBC
HCT: 33.9 % — ABNORMAL LOW (ref 39.0–52.0)
Hemoglobin: 11.5 g/dL — ABNORMAL LOW (ref 13.0–17.0)
MCH: 29.6 pg (ref 26.0–34.0)
MCHC: 33.9 g/dL (ref 30.0–36.0)
MCV: 87.4 fL (ref 80.0–100.0)
Platelets: 299 10*3/uL (ref 150–400)
RBC: 3.88 MIL/uL — ABNORMAL LOW (ref 4.22–5.81)
RDW: 13.5 % (ref 11.5–15.5)
WBC: 8.4 10*3/uL (ref 4.0–10.5)
nRBC: 0 % (ref 0.0–0.2)

## 2018-09-05 LAB — BASIC METABOLIC PANEL
Anion gap: 7 (ref 5–15)
BUN: 9 mg/dL (ref 6–20)
CO2: 29 mmol/L (ref 22–32)
Calcium: 8.5 mg/dL — ABNORMAL LOW (ref 8.9–10.3)
Chloride: 103 mmol/L (ref 98–111)
Creatinine, Ser: 1.23 mg/dL (ref 0.61–1.24)
GFR calc Af Amer: >60 mL/min (ref >60)
GFR calc non Af Amer: >60 mL/min (ref >60)
Glucose, Bld: 113 mg/dL — ABNORMAL HIGH (ref 70–99)
Potassium: 3.3 mmol/L — ABNORMAL LOW (ref 3.5–5.1)
Sodium: 139 mmol/L (ref 135–145)

## 2018-09-05 MED ORDER — LEVALBUTEROL HCL 0.63 MG/3ML IN NEBU
0.6300 mg | INHALATION_SOLUTION | Freq: Four times a day (QID) | RESPIRATORY_TRACT | Status: DC | PRN
  Administered 2018-09-05: 0.63 mg via RESPIRATORY_TRACT
  Filled 2018-09-05: qty 3

## 2018-09-05 NOTE — Discharge Summary (Signed)
Physician Discharge Summary  Patient ID: Nicholas Mccarty MRN: [id number] DOB/AGE: 1970/07/01 48 y.o.  Admit date: 08/29/2018 Discharge date: 09/06/2018  Admission Diagnoses:  Recurrent spontaneous pneumothorax   Spontaneous pneumothorax History of Thyroid Cancer, S/P thyroidectomy Postsurgical hypothyroidism Gastroesophogeal refulx disease  Discharge Diagnoses:    Recurrent spontaneous pneumothorax   Spontaneous pneumothorax History of Thyroid Cancer, S/P thyroidectomy Postsurgical hypothyroidism Gastroesophogeal refulx disease   Discharged Condition: stable  History of Presenting Illness:  Nicholas Mccarty is a 48 year old male with a history of alcohol abuse, tobacco abuse, crack cocaine abuse, thyroid cancer, postsurgical hypothyroidism, and past history of spontaneous pneumothorax.  He states that he had acute onset chest pain with shortness of breath after mowing his grass one day prior to admission.  He presented to the ED at an outside hospital where a chest x-ray demonstrated a fairly large right pneumothorax with possible tension component.  He was transferred to [facility] for further evaluation and management. The patient had a spontaneous pneumothorax on the right side x2 and 2014 with subsequent right VATS in October of that year by Dr Servando Snare.    He has continued to smoke since that episode.  SARS coronavirus-2 screen was negative this admission.  Hospital Course: The interventional radiology team was consulted and a right 42fr pleural tube was placed using CT guidance with decompression of the pneumothorax.  Follow up CXR showed a residual right apical PTX that persisted over the next few days.  Although he remained stable from a respiratory standpoint, a significant air leak persisted. .  A non-contrast CT scan of the chest was obtained on 09/01/18 that demonstrated a large right pneumothorax with several apical blebs.  Right VATS with blebectomy and pleurodesis was  offered to the patient by Dr. Roxy Manns.  The pt agreed to proceed and was taken to the OR that same day for the procedure.  He tolerated this well.  He did not have an air leak following surgery and subsequent CXR's shwed full expansion of the right lung.  He made a progressive and uneventful recovery.  The chest tube was removed on post-op day 4. Follow up CXR was satisfactory with no pneumothorax.  He was maintaining acceptable O2 saturations on RA. Pain control was adequate on oral analgesics.  Consults: Interventional Radiology  Significant Diagnostic Studies:   EXAM: CHEST - 2 VIEW  COMPARISON:  02/19/2018.  FINDINGS: Moderate right hydropneumothorax, most likely under tension noted. Lungs are clear of infiltrates. Heart size normal. No acute bony abnormality.  IMPRESSION: Moderate right hydropneumothorax, most likely under tension.  Critical Value/emergent results were called by telephone at the time of interpretation on 08/29/2018 at 12:35 pm to Dr. Lennice Sites , who verbally acknowledged these results.   Electronically Signed   By: Marcello Moores  Register   On: 08/29/2018 12:38  EXAM: PORTABLE CHEST 1 VIEW  COMPARISON:  08/29/2018  FINDINGS: The patient has undergone right-sided chest tube placement. There is significant interval improvement in the previously demonstrated large right-sided pneumothorax. There remains a small primarily a pickle right-sided pneumothorax. The pigtail terminates over the right lower lung field. There is no large area of consolidation. The left lung field is essentially clear. The heart size is normal. There is no definite acute osseous abnormality.  IMPRESSION: Significant interval improvement in the patient's right-sided pneumothorax status post placement of a right-sided chest tube. There remains a small residual right apical pneumothorax.   Electronically Signed   By: Constance Holster M.D.   On: 08/29/2018  18:20  EXAM: CT CHEST WITHOUT CONTRAST  TECHNIQUE: Multidetector CT imaging of the chest was performed following the standard protocol without IV contrast.  COMPARISON:  09/01/2018 and prior CT  FINDINGS: Cardiovascular: Heart size appears normal. There is no evidence of thoracic aortic aneurysm or pericardial effusion.  Mediastinum/Nodes: No enlarged mediastinal or axillary lymph nodes. Thyroid gland, trachea, and esophagus demonstrate no significant findings.  Lungs/Pleura: A moderate to large RIGHT pneumothorax is noted. A percutaneous RIGHT pigtail thoracostomy catheter is present within the LOWER RIGHT pleural space.  There are at least 4 blebs each measuring 1-1.5 cm along the MEDIAL apical RIGHT UPPER lobe.  Dependent and basilar atelectasis with mid and LOWER RIGHT lung noted.  Moderate RIGHT chest subcutaneous emphysema noted.  The LEFT lung is clear.  Upper Abdomen: No acute abnormality.  Musculoskeletal: No acute or suspicious bony abnormalities.  IMPRESSION: 1. Moderate to large RIGHT pneumothorax with percutaneous RIGHT pigtail thoracostomy catheter in the LOWER RIGHT pleural space. Four blebs each measuring 1-1.5 cm along the MEDIAL apical RIGHT UPPER lobe. Moderate RIGHT subcutaneous emphysema. 2. Scattered RIGHT lung atelectasis.  CLINICAL DATA:  Status post right chest tube removal  EXAM: CHEST - 2 VIEW 09/06/2018  COMPARISON:  09/05/2018  FINDINGS: There is interval removal of the right-sided chest tube. No residual pneumothorax. Right lung interstitial thickening. Trace right pleural effusion. Right basilar atelectasis. Left lung is clear. Stable cardiomediastinal silhouette. No aggressive osseous lesion.  There is no acute osseous abnormality.  IMPRESSION: Interval removal of the right-sided chest tube without residual pneumothorax. Mild right lung interstitial edema and right basilar Atelectasis.  Electronically  Signed   By: Kathreen Devoid   On: 09/06/2018 09:23     Treatments:   CARDIOTHORACIC SURGERY OPERATIVE NOTE  Date of Procedure:                            09/01/2018  Preoperative Diagnosis:                  Recurrent Right Spontaneous Pneumothorax  Postoperative Diagnosis:                same  Procedure:                                         Redo Right Video-assisted Thoracoscopy for Apical Bleb Resection and Mechanical Pleurodesis              Surgeon:                                            Valentina Gu. Roxy Manns, MD  Assistant:                                           Enid Cutter, PA-C  Anesthesia:                                        Roberts Gaudy, MD    BRIEF CLINICAL NOTE AND INDICATIONS FOR SURGERY  Patient is a 48 year old African-American male with history  of tobacco use, crack cocaine use, alcohol use and recurrent right spontaneous pneumothorax for which the patient underwent right video-assisted thoracoscopy in October 2014 by Dr. Servando Snare was admitted to the hospital August 29, 2018 with another recurrent right spontaneous pneumothorax.  The patient's pneumothorax was initially treated with chest tube placement but the patient has had persistent air leak and incomplete reexpansion of the right lung for several days.  Noncontrast CT scan of the chest reveals apical subpleural blebs.  The patient has been counseled at length regarding the indications, risks and potential benefits of surgery.  All questions have been answered, and the patient provides full informed consent for the operation as described.   Discharge Exam: Blood pressure (!) 135/98, pulse 77, temperature 98.4 F (36.9 C), temperature source Oral, resp. rate 19, height 6\' 2"  (1.88 m), weight 103 kg, SpO2 98 %.  General appearance: alert, cooperative and no distress Neurologic: intact Heart: regular rate and rhythm Lungs: clear to auscultation bilaterally.  CXR report not yet posted but no  significant ptx is evident, there is new density in the right mid lung zone. This is not unexpected after extensive mechanical pleurodesis.  Wound: Chest incision and CT site are dry.   Disposition:    Allergies as of 09/06/2018   No Known Allergies     Medication List    TAKE these medications   acetaminophen 500 MG tablet Commonly known as: TYLENOL Take 1,000 mg by mouth every 6 (six) hours as needed for mild pain or headache.   albuterol (2.5 MG/3ML) 0.083% nebulizer solution Commonly known as: PROVENTIL Take 3 mLs (2.5 mg total) by nebulization every 6 (six) hours as needed for wheezing or shortness of breath.   albuterol 108 (90 Base) MCG/ACT inhaler Commonly known as: VENTOLIN HFA Inhale 1-2 puffs into the lungs every 6 (six) hours as needed for wheezing or shortness of breath.   allopurinol 100 MG tablet Commonly known as: ZYLOPRIM Take 1 tablet (100 mg total) by mouth daily.   amLODipine 10 MG tablet Commonly known as: NORVASC Take 1 tablet (10 mg total) by mouth daily. For high blood pressure   cloNIDine 0.2 MG tablet Commonly known as: CATAPRES Take 1 tablet (0.2 mg total) by mouth every 6 (six) hours as needed (sbp>140 or DBP>90).   colchicine 0.6 MG tablet Take 1 tablet (0.6 mg total) by mouth 2 (two) times daily.   erythromycin ophthalmic ointment Place a 1/2 inch ribbon of ointment into both lower eyelids three times per day.   famotidine 20 MG tablet Commonly known as: Pepcid Take 1 tablet (20 mg total) by mouth 2 (two) times daily.   fluticasone 50 MCG/ACT nasal spray Commonly known as: FLONASE Place 2 sprays into both nostrils daily.   levothyroxine 150 MCG tablet Commonly known as: SYNTHROID Take 1 tablet (150 mcg total) by mouth daily before breakfast.   loratadine 10 MG tablet Commonly known as: CLARITIN Take 1 tablet (10 mg total) by mouth daily. What changed:   when to take this  reasons to take this   montelukast 10 MG  tablet Commonly known as: SINGULAIR Take 1 tablet (10 mg total) by mouth at bedtime.   oxyCODONE-acetaminophen 5-325 MG tablet Commonly known as: Percocet Take 1 tablet by mouth every 4 (four) hours as needed for up to 7 days for severe pain.   traZODone 150 MG tablet Commonly known as: DESYREL Take 1 tablet (150 mg total) by mouth at bedtime as needed for sleep.  Follow-up Information    [facility] Go on 09/20/2018.   Specialty: Cardiothoracic Surgery Why: You have and appointment at Dr. Guy Sandifer office on Tuesday, 09/20/18 at 1:30.  Please arrive 30 minutes early for a chest x-ray to be done at [facility] located on the first floor of the same building. Contact information: [place], [place] [phone]          Signed: Antony Odea, PA-C 09/06/2018, 9:31 AM

## 2018-09-05 NOTE — Progress Notes (Signed)
5 ml of dilaudid PCA wasted into steri cycle. Witnessed by Ivin Booty, RN.

## 2018-09-05 NOTE — Progress Notes (Addendum)
4 Days Post-Op Procedure(s) (LRB): RIGHT VIDEO ASSISTED THORACOSCOPY WITH STAPLING OF BLEBS AND PLEURODESIS (Right) Subjective: Feels well, no new problems.   Objective: Vital signs in last 24 hours: Temp:  [97.7 F (36.5 C)-98.2 F (36.8 C)] 98 F (36.7 C) (06/22 0741) Pulse Rate:  [33-94] 81 (06/22 0741) Cardiac Rhythm: Normal sinus rhythm;Bundle branch block (06/22 0700) Resp:  [11-24] 16 (06/22 0741) BP: (103-127)/(72-86) 125/74 (06/22 0741) SpO2:  [90 %-100 %] 94 % (06/22 0741)     Intake/Output from previous day: 06/21 0701 - 06/22 0700 In: 354 [P.O.:354] Out: 835 [Urine:795; Chest Tube:40] Intake/Output this shift: No intake/output data recorded.  General appearance: alert, cooperative and no distress Heart: regular rate and rhythm Lungs: clear to auscultation bilaterally, no air leak, minimal drainage from CT. Abdomen: benign Extremities: no edema or calf tenderness Wound: incision healing with no sign of complication.  Lab Results: Recent Labs    09/03/18 0252 09/05/18 0340  WBC 13.4* 8.4  HGB 12.8* 11.5*  HCT 37.2* 33.9*  PLT 289 299   BMET:  Recent Labs    09/03/18 0252 09/05/18 0340  NA 138 139  K 3.4* 3.3*  CL 100 103  CO2 28 29  GLUCOSE 134* 113*  BUN 10 9  CREATININE 1.16 1.23  CALCIUM 8.5* 8.5*    PT/INR: No results for input(s): LABPROT, INR in the last 72 hours. ABG    Component Value Date/Time   PHART 7.392 01/04/2013 0450   HCO3 25.9 (H) 01/04/2013 0450   TCO2 23 09/11/2014 1751   O2SAT 95.8 01/04/2013 0450   CBG (last 3)  No results for input(s): GLUCAP in the last 72 hours.  Assessment/Plan: S/P Procedure(s) (LRB): RIGHT VIDEO ASSISTED THORACOSCOPY WITH STAPLING OF BLEBS AND PLEURODESIS (Right)  -POD4 re-do VATS for right apical bleb resection and  mechanical pleurodesis for recurrent spontaneous right PTX, first re-occurrence since VATS in 2014 for the same Dx.  No significant air leak since surgery. CT drainage minimal  past 24  Hours. CXR shows good expansion of the right lung, no PTX. Plan to remove CT later today.   -History of HTN- controlled with his usual medications including clonidine and amlodipine.  -History of hypothyrodism following thyroidectomy for for cancer. Synthroid resumed.   LOS: 7 days    Antony Odea, PA-C [phone] 09/05/2018  I have seen and examined the patient and agree with the assessment and plan as outlined.  D/C tube today.  Tentatively plan D/C home tomorrow  Rexene Alberts, MD 09/05/2018 8:20 AM

## 2018-09-05 NOTE — Discharge Instructions (Signed)
Pneumothorax A pneumothorax is commonly called a collapsed lung. It is a condition in which air leaks from a lung and builds up between the thin layer of tissue that covers the lungs (visceral pleura) and the interior wall of the chest cavity (parietal pleura). The air gets trapped outside the lung, between the lung and the chest wall (pleural space). The air takes up space and prevents the lung from fully expanding. This condition sometimes occurs suddenly with no apparent cause. The buildup of air may be small or large. A small pneumothorax may go away on its own. A large pneumothorax will require treatment and hospitalization. What are the causes? This condition may be caused by:  Trauma and injury to the chest wall.  Surgery and other medical procedures.  A complication of an underlying lung problem, especially chronic obstructive pulmonary disease (COPD) or emphysema. Sometimes the cause of this condition is not known. What increases the risk? You are more likely to develop this condition if:  You have an underlying lung problem.  You smoke.  You are 52-10 years old, male, tall, and underweight.  You have a personal or family history of pneumothorax.  You have an eating disorder (anorexia nervosa). This condition can also happen quickly, even in people with no history of lung problems. What are the signs or symptoms? Sometimes a pneumothorax will have no symptoms. When symptoms are present, they can include:  Chest pain.  Shortness of breath.  Increased rate of breathing.  Bluish color to your lips or skin (cyanosis). How is this diagnosed? This condition may be diagnosed by:  A medical history and physical exam.  A chest X-ray, chest CT scan, or ultrasound. How is this treated? Treatment depends on how severe your condition is. The goal of treatment is to remove the extra air and allow your lung to expand back to its normal size.  For a small pneumothorax: ? No  treatment may be needed. ? Extra oxygen is sometimes used to make it go away more quickly.  For a large pneumothorax or a pneumothorax that is causing symptoms, a procedure is done to drain the air from your lungs. To do this, a health care provider may use: ? A needle with a syringe. This is used to suck air from a pleural space where no additional leakage is taking place. ? A chest tube. This is used to suck air where there is ongoing leakage into the pleural space. The chest tube may need to remain in place for several days until the air leak has healed.  In more severe cases, surgery may be needed to repair the damage that is causing the leak.  If you have multiple pneumothorax episodes or have an air leak that will not heal, a procedure called a pleurodesis may be done. A medicine is placed in the pleural space to irritate the tissues around the lung so that the lung will stick to the chest wall, seal any leaks, and stop any buildup of air in that space. If you have an underlying lung problem, severe symptoms, or a large pneumothorax you will usually need to stay in the hospital. Follow these instructions at home: Lifestyle  Do not use any products that contain nicotine or tobacco, such as cigarettes and e-cigarettes. These are major risk factors in pneumothorax. If you need help quitting, ask your health care provider.  Do not lift anything that is heavier than 10 lb (4.5 kg), or the limit that your health  care provider tells you, until he or she says that it is safe.  Avoid activities that take a lot of effort (strenuous) for as long as told by your health care provider.  Return to your normal activities as told by your health care provider. Ask your health care provider what activities are safe for you.  Do not fly in an airplane or scuba dive until your health care provider says it is okay. General instructions  Take over-the-counter and prescription medicines only as told by your  health care provider.  If a cough or pain makes it difficult for you to sleep at night, try sleeping in a semi-upright position in a recliner or by using 2 or 3 pillows.  If you had a chest tube and it was removed, ask your health care provider when you can remove the bandage (dressing). While the dressing is in place, do not allow it to get wet.  Keep all follow-up visits as told by your health care provider. This is important. Contact a health care provider if:  You cough up thick mucus (sputum) that is yellow or green in color.  You were treated with a chest tube, and you have redness, increasing pain, or discharge at the site where it was placed. Get help right away if:  You have increasing chest pain or shortness of breath.  You have a cough that will not go away.  You begin coughing up blood.  You have pain that is getting worse or is not controlled with medicines.  The site where your chest tube was located opens up.  You feel air coming out of the site where the chest tube was placed.  You have a fever or persistent symptoms for more than 2-3 days.  You have a fever and your symptoms suddenly get worse. These symptoms may represent a serious problem that is an emergency. Do not wait to see if the symptoms will go away. Get medical help right away. Call your local emergency services (911 in the [place] Do not drive yourself to the hospital. Summary  A pneumothorax, commonly called a collapsed lung, is a condition in which air leaks from a lung and gets trapped between the lung and the chest wall (pleural space).  The buildup of air may be small or large. A small pneumothorax may go away on its own. A large pneumothorax will require treatment and hospitalization.  Treatment for this condition depends on how severe the pneumothorax is. The goal of treatment is to remove the extra air and allow the lung to expand back to its normal size. This information is not intended to  replace advice given to you by your health care provider. Make sure you discuss any questions you have with your health care provider. Document Released: 03/02/2005 Document Revised: 02/08/2017 Document Reviewed: 02/08/2017 Elsevier Interactive Patient Education  2019 [employer]    Chest Wall Pain Chest wall pain is pain in or around the bones and muscles of your chest. Sometimes, an injury causes this pain. Excessive coughing or overuse of arm and chest muscles may also cause chest wall pain. Sometimes, the cause may not be known. This pain may take several weeks or longer to get better. Follow these instructions at home: Managing pain, stiffness, and swelling   If directed, put ice on the painful area: ? Put ice in a plastic bag. ? Place a towel between your skin and the bag. ? Leave the ice on for 20 minutes, 2-3  times per day. Activity  Rest as told by your health care provider.  Avoid activities that cause pain. These include any activities that use your chest muscles or your abdominal and side muscles to lift heavy items. Ask your health care provider what activities are safe for you. General instructions   Take over-the-counter and prescription medicines only as told by your health care provider.  Do not use any products that contain nicotine or tobacco, such as cigarettes, e-cigarettes, and chewing tobacco. These can delay healing after injury. If you need help quitting, ask your health care provider.  Keep all follow-up visits as told by your health care provider. This is important. Contact a health care provider if:  You have a fever.  Your chest pain becomes worse.  You have new symptoms. Get help right away if:  You have nausea or vomiting.  You feel sweaty or light-headed.  You have a cough with mucus from your lungs (sputum) or you cough up blood.  You develop shortness of breath. These symptoms may represent a serious problem that is an emergency. Do  not wait to see if the symptoms will go away. Get medical help right away. Call your local emergency services (911 in the [place] Do not drive yourself to the hospital. Summary  Chest wall pain is pain in or around the bones and muscles of your chest.  Depending on the cause, it may be treated with ice, rest, medicines, and avoiding activities that cause pain.  Contact a health care provider if you have a fever, worsening chest pain, or new symptoms.  Get help right away if you feel light-headed or you develop shortness of breath. These symptoms may be an emergency. This information is not intended to replace advice given to you by your health care provider. Make sure you discuss any questions you have with your health care provider. Document Released: 03/02/2005 Document Revised: 09/02/2017 Document Reviewed: 09/02/2017 Elsevier Interactive Patient Education  2019 [employer].

## 2018-09-06 ENCOUNTER — Inpatient Hospital Stay (HOSPITAL_COMMUNITY): Payer: Self-pay

## 2018-09-06 MED ORDER — OXYCODONE-ACETAMINOPHEN 5-325 MG PO TABS: 1.0000 | ORAL_TABLET | ORAL | 0 refills | Status: DC | PRN

## 2018-09-06 MED ORDER — OXYCODONE-ACETAMINOPHEN 5-325 MG PO TABS: 1.0000 | ORAL_TABLET | ORAL | 0 refills | Status: AC | PRN

## 2018-09-06 NOTE — TOC Transition Note (Signed)
Transition of Care [facility]) - CM/SW Discharge Note Marvetta Gibbons RN, BSN Transitions of Care Unit 4E- RN Case Manager [phone]   Patient Details  Name: Nicholas Mccarty MRN: [id number] Date of Birth: 1971/01/19  [facility]) CM/SW Contact:  Dawayne Patricia, RN Phone Number: 09/06/2018, 9:57 AM   Clinical Narrative:    Pt admitted with recurrent spont. Pntx, s/pt chest tube placement. S/p VATs with stapling of blebs and pleurodesis 5 days ago. Pt stable for transition home today. Follow up in place. No CM needs noted for transition home.    Final next level of care: Home/Self Care Barriers to Discharge: No Barriers Identified   Patient Goals and CMS Choice Patient states their goals for this hospitalization and ongoing recovery are:: home and recover   Choice offered to / list presented to : NA  Discharge Placement  Home                     Discharge Plan and Services In-house Referral: NA Discharge Planning Services: NA Post Acute Care Choice: NA          DME Arranged: N/A DME Agency: NA       HH Arranged: NA HH Agency: NA        Social Determinants of Health (SDOH) Interventions     Readmission Risk Interventions Readmission Risk Prevention Plan 09/06/2018  Transportation Screening Complete  Medication Review [employer] photographer) Complete  PCP or Specialist appointment within 3-5 days of discharge Complete  HRI or [place] Complete  SW Recovery Care/Counseling Consult Complete  [place] Not Applicable  Some recent data might be hidden

## 2018-09-06 NOTE — Plan of Care (Signed)
Patient with discharge orders for home. AAOx4 ambulating in the halls with no c/o pain.

## 2018-09-06 NOTE — Progress Notes (Signed)
5 Days Post-Op Procedure(s) (LRB): RIGHT VIDEO ASSISTED THORACOSCOPY WITH STAPLING OF BLEBS AND PLEURODESIS (Right) Subjective: Feels well, no complaints, pain controlled primarily with oral analgesics.   Objective: Vital signs in last 24 hours: Temp:  [98.4 F (36.9 C)-99 F (37.2 C)] 98.4 F (36.9 C) (06/23 0325) Pulse Rate:  [77-90] 77 (06/23 0325) Cardiac Rhythm: Normal sinus rhythm;Bundle branch block (06/22 1900) Resp:  [14-25] 19 (06/23 0327) BP: (114-151)/(80-98) 135/98 (06/23 0325) SpO2:  [94 %-98 %] 98 % (06/23 0325)   Intake/Output from previous day: 06/22 0701 - 06/23 0700 In: 720 [P.O.:720] Out: 1475 [Urine:1475] Intake/Output this shift: No intake/output data recorded.  General appearance: alert, cooperative and no distress Neurologic: intact Heart: regular rate and rhythm Lungs: clear to auscultation bilaterally.  CXR report not yet posted but no significant ptx is evident, there is new density in the right mid lung zone.  Wound: Chest incision and CT site are dry.   Lab Results: Recent Labs    09/05/18 0340  WBC 8.4  HGB 11.5*  HCT 33.9*  PLT 299   BMET:  Recent Labs    09/05/18 0340  NA 139  K 3.3*  CL 103  CO2 29  GLUCOSE 113*  BUN 9  CREATININE 1.23  CALCIUM 8.5*    PT/INR: No results for input(s): LABPROT, INR in the last 72 hours. ABG    Component Value Date/Time   PHART 7.392 01/04/2013 0450   HCO3 25.9 (H) 01/04/2013 0450   TCO2 23 09/11/2014 1751   O2SAT 95.8 01/04/2013 0450   CBG (last 3)  No results for input(s): GLUCAP in the last 72 hours.  Assessment/Plan: S/P Procedure(s) (LRB): RIGHT VIDEO ASSISTED THORACOSCOPY WITH STAPLING OF BLEBS AND PLEURODESIS (Right)   -POD5 re-do VATS for right apical bleb resection and mechanical pleurodesis for recurrent spontaneous right PTX, first re-occurrence since VATS in 2014 for the same Dx. Chest tube out yesterday, no PTX.  D/C PCA,  Plan discharge for later today.    -History of HTN- controlled with his usual medications including clonidine and amlodipine.  -History of hypothyrodism following thyroidectomy for for cancer. Synthroid resumed.    LOS: 8 days    Antony Odea, [place] [phone] 09/06/2018

## 2018-09-06 NOTE — Progress Notes (Signed)
PCA discontinued. Patient discontinued own Iv's. Discharge instructions with prescription x1 given to the patient with verbal understanding  Received. Family called patient ambulated to the main lobbiy with tech.  Appreciative of the care.

## 2018-09-19 ENCOUNTER — Other Ambulatory Visit: Payer: Self-pay | Admitting: Thoracic Surgery (Cardiothoracic Vascular Surgery)

## 2018-09-19 ENCOUNTER — Other Ambulatory Visit: Payer: Self-pay

## 2018-09-19 ENCOUNTER — Ambulatory Visit (INDEPENDENT_AMBULATORY_CARE_PROVIDER_SITE_OTHER): Payer: Self-pay | Admitting: Physician Assistant

## 2018-09-19 ENCOUNTER — Ambulatory Visit
Admission: RE | Admit: 2018-09-19 | Discharge: 2018-09-19 | Disposition: A | Discharge status: Home / Self Care | Payer: Self-pay | Source: Ambulatory Visit | Attending: Thoracic Surgery (Cardiothoracic Vascular Surgery) | Admitting: Thoracic Surgery (Cardiothoracic Vascular Surgery)

## 2018-09-19 ENCOUNTER — Encounter: Payer: Self-pay | Admitting: Physician Assistant

## 2018-09-19 VITALS — BP 119/81 | HR 90 | Temp 97.5°F | Resp 16 | Ht 74.0 in | Wt 220.6 lb

## 2018-09-19 DIAGNOSIS — J9383 Other pneumothorax: Secondary | ICD-10-CM

## 2018-09-19 DIAGNOSIS — Z09 Encounter for follow-up examination after completed treatment for conditions other than malignant neoplasm: Secondary | ICD-10-CM

## 2018-09-19 MED ORDER — OXYCODONE HCL 5 MG PO TABS: 5.0000 mg | ORAL_TABLET | Freq: Four times a day (QID) | ORAL | 0 refills | Status: DC | PRN

## 2018-09-19 NOTE — Progress Notes (Signed)
HPI:  Patient returns for routine postoperative follow-up having undergone a right VATS, bleb resection, and mechanical pleurodesis by Dr. Roxy Manns on 09/01/2018. Since hospital discharge the patient reports, he has mostly been taking Oxy at night. He only has one left and is requesting a few more. He states the incisional pain continues to improve. He denies shortness of breath and chest pain. He denies tobacco use or illicit drug use.  Current Outpatient Medications  Medication Sig Dispense Refill  . acetaminophen (TYLENOL) 500 MG tablet Take 1,000 mg by mouth every 6 (six) hours as needed for mild pain or headache.    . albuterol (PROVENTIL HFA;VENTOLIN HFA) 108 (90 Base) MCG/ACT inhaler Inhale 1-2 puffs into the lungs every 6 (six) hours as needed for wheezing or shortness of breath. 1 Inhaler 11  . albuterol (PROVENTIL) (2.5 MG/3ML) 0.083% nebulizer solution Take 3 mLs (2.5 mg total) by nebulization every 6 (six) hours as needed for wheezing or shortness of breath. 150 mL 11  . allopurinol (ZYLOPRIM) 100 MG tablet Take 1 tablet (100 mg total) by mouth daily. 30 tablet 6  . amLODipine (NORVASC) 10 MG tablet Take 1 tablet (10 mg total) by mouth daily. For high blood pressure 30 tablet 6  . cloNIDine (CATAPRES) 0.2 MG tablet Take 1 tablet (0.2 mg total) by mouth every 6 (six) hours as needed (sbp>140 or DBP>90). 90 tablet 3  . colchicine 0.6 MG tablet Take 1 tablet (0.6 mg total) by mouth 2 (two) times daily. 60 tablet 3  . erythromycin ophthalmic ointment Place a 1/2 inch ribbon of ointment into both lower eyelids three times per day. 1 g 0  . famotidine (PEPCID) 20 MG tablet Take 1 tablet (20 mg total) by mouth 2 (two) times daily. 60 tablet 3  . fluticasone (FLONASE) 50 MCG/ACT nasal spray Place 2 sprays into both nostrils daily. 16 g 6  . levothyroxine (SYNTHROID) 150 MCG tablet Take 1 tablet (150 mcg total) by mouth daily before breakfast. 30 tablet 3  . loratadine (CLARITIN) 10 MG tablet Take  1 tablet (10 mg total) by mouth daily. (Patient taking differently: Take 10 mg by mouth daily as needed for allergies. ) 30 tablet 11  . montelukast (SINGULAIR) 10 MG tablet Take 1 tablet (10 mg total) by mouth at bedtime. 30 tablet 3  . traZODone (DESYREL) 150 MG tablet Take 1 tablet (150 mg total) by mouth at bedtime as needed for sleep. 30 tablet 0  Vital Signs: BP 119/81,HR 90, RR 16, Oxygenation 96% on room air.   Physical Exam: CV-RRR Pulmonary-Clear to auscultation bilaterally Extremities-No LE edema Wounds-Clean and dry. 2 silk sutures removed without difficulty.  Diagnostic Tests: CLINICAL DATA:  Recent spontaneous pneumothorax.  EXAM: CHEST - 2 VIEW  COMPARISON:  Radiographs 09/06/2018 and 09/05/2018.  CT 09/01/2018.  FINDINGS: The heart size and mediastinal contours are stable. There is mild residual pleural thickening on the right, but no recurrent pneumothorax, chest wall soft tissue emphysema or significant pleural effusion. The left lung is clear. The bones appear unremarkable.  IMPRESSION: Resolved right chest wall soft tissue emphysema with improved pleural thickening on the right. No recurrent pneumothorax or acute findings.   Electronically Signed   By: Richardean Sale M.D.   On: 09/19/2018 11:19  Impression and Plan: Overall, Mr. Dollens is recovering well from right VATS, bleb resection. I have given him a refill (12 only) for Oxy as he likes to take it at night for pain. I instructed him he may  drive once he is NOT taking any narcotic for pain. He asked to return to work on Monday 07/13, which I gave him a note for. He was informed if there is a lot of physical activity his pain might worsen at first. He will return to see Dr. Roxy Manns PRN.    Nani Skillern, PA-C [facility] [phone]

## 2018-09-19 NOTE — Patient Instructions (Signed)
You may return to driving an automobile as long as you are no longer requiring oral narcotic pain relievers during the daytime.  It would be wise to start driving only short distances during the daylight and gradually increase from there as you feel comfortable. 

## 2018-09-20 ENCOUNTER — Ambulatory Visit: Payer: Self-pay

## 2018-09-21 ENCOUNTER — Encounter: Payer: Self-pay | Admitting: Family Medicine

## 2018-09-21 ENCOUNTER — Other Ambulatory Visit: Payer: Self-pay

## 2018-09-21 ENCOUNTER — Ambulatory Visit (INDEPENDENT_AMBULATORY_CARE_PROVIDER_SITE_OTHER): Payer: Self-pay | Admitting: Family Medicine

## 2018-09-21 VITALS — BP 130/88 | HR 94 | Temp 98.0°F | Ht 74.0 in | Wt 225.0 lb

## 2018-09-21 DIAGNOSIS — F419 Anxiety disorder, unspecified: Secondary | ICD-10-CM | POA: Insufficient documentation

## 2018-09-21 DIAGNOSIS — I10 Essential (primary) hypertension: Secondary | ICD-10-CM

## 2018-09-21 DIAGNOSIS — J45909 Unspecified asthma, uncomplicated: Secondary | ICD-10-CM

## 2018-09-21 DIAGNOSIS — F329 Major depressive disorder, single episode, unspecified: Secondary | ICD-10-CM

## 2018-09-21 DIAGNOSIS — G8918 Other acute postprocedural pain: Secondary | ICD-10-CM | POA: Insufficient documentation

## 2018-09-21 DIAGNOSIS — Z09 Encounter for follow-up examination after completed treatment for conditions other than malignant neoplasm: Secondary | ICD-10-CM

## 2018-09-21 DIAGNOSIS — F32A Depression, unspecified: Secondary | ICD-10-CM

## 2018-09-21 DIAGNOSIS — M109 Gout, unspecified: Secondary | ICD-10-CM | POA: Insufficient documentation

## 2018-09-21 LAB — POCT URINALYSIS DIP (MANUAL ENTRY)
Bilirubin, UA: NEGATIVE
Blood, UA: NEGATIVE
Glucose, UA: NEGATIVE mg/dL
Ketones, POC UA: NEGATIVE mg/dL
Leukocytes, UA: NEGATIVE
Nitrite, UA: NEGATIVE
Protein Ur, POC: NEGATIVE mg/dL
Spec Grav, UA: >=1.03 — AB (ref 1.010–1.025)
Urobilinogen, UA: 0.2 E.U./dL
pH, UA: 5.5 (ref 5.0–8.0)

## 2018-09-21 MED ORDER — IBUPROFEN 800 MG PO TABS: 800.0000 mg | ORAL_TABLET | Freq: Three times a day (TID) | ORAL | 3 refills | Status: DC | PRN

## 2018-09-21 MED FILL — IBUPROFEN 800 MG TABLET: 800 | 10 days supply | Qty: 30 | Fill #0
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
## 2018-09-21 NOTE — Patient Instructions (Signed)
Ibuprofen tablets and capsules What is this medicine? IBUPROFEN (eye BYOO proe fen) is a non-steroidal anti-inflammatory drug (NSAID). It is used for dental pain, fever, headaches or migraines, osteoarthritis, rheumatoid arthritis, or painful monthly periods. It can also relieve minor aches and pains caused by a cold, flu, or sore throat. This medicine may be used for other purposes; ask your health care provider or pharmacist if you have questions. COMMON BRAND NAME(S): Advil, Advil Junior Strength, Advil Migraine, Genpril, Ibren, IBU, Midol, Midol Cramps and Body Aches, Motrin, Motrin IB, Motrin Junior Strength, Motrin Migraine Pain, Samson-8, Toxicology Saliva Collection What should I tell my health care provider before I take this medicine? They need to know if you have any of these conditions:  cigarette smoker  coronary artery bypass graft (CABG) surgery within the past 2 weeks  drink more than 3 alcohol-containing drinks a day  heart disease  high blood pressure  history of stomach bleeding  kidney disease  liver disease  lung or breathing disease, like asthma  an unusual or allergic reaction to ibuprofen, aspirin, other NSAIDs, other medicines, foods, dyes, or preservatives  pregnant or trying to get pregnant  breast-feeding How should I use this medicine? Take this medicine by mouth with a glass of water. Follow the directions on the prescription label. Take this medicine with food if your stomach gets upset. Try to not lie down for at least 10 minutes after you take the medicine. Take your medicine at regular intervals. Do not take your medicine more often than directed. A special MedGuide will be given to you by the pharmacist with each prescription and refill. Be sure to read this information carefully each time. Talk to your pediatrician regarding the use of this medicine in children. Special care may be needed. Overdosage: If you think you have taken too much of this  medicine contact a poison control center or emergency room at once. NOTE: This medicine is only for you. Do not share this medicine with others. What if I miss a dose? If you miss a dose, take it as soon as you can. If it is almost time for your next dose, take only that dose. Do not take double or extra doses. What may interact with this medicine? Do not take this medicine with any of the following medications:  cidofovir  ketorolac  methotrexate  pemetrexed This medicine may also interact with the following medications:  alcohol  aspirin  diuretics  lithium  other drugs for inflammation like prednisone  warfarin This list may not describe all possible interactions. Give your health care provider a list of all the medicines, herbs, non-prescription drugs, or dietary supplements you use. Also tell them if you smoke, drink alcohol, or use illegal drugs. Some items may interact with your medicine. What should I watch for while using this medicine? Tell your doctor or healthcare provider if your symptoms do not start to get better or if they get worse. This medicine may cause serious skin reactions. They can happen weeks to months after starting the medicine. Contact your healthcare provider right away if you notice fevers or flu-like symptoms with a rash. The rash may be red or purple and then turn into blisters or peeling of the skin. Or, you might notice a red rash with swelling of the face, lips or lymph nodes in your neck or under your arms. This medicine does not prevent heart attack or stroke. In fact, this medicine may increase the chance of a heart  attack or stroke. The chance may increase with longer use of this medicine and in people who have heart disease. If you take aspirin to prevent heart attack or stroke, talk with your doctor or healthcare provider. Do not take other medicines that contain aspirin, ibuprofen, or naproxen with this medicine. Side effects such as stomach  upset, nausea, or ulcers may be more likely to occur. Many medicines available without a prescription should not be taken with this medicine. This medicine can cause ulcers and bleeding in the stomach and intestines at any time during treatment. Ulcers and bleeding can happen without warning symptoms and can cause death. To reduce your risk, do not smoke cigarettes or drink alcohol while you are taking this medicine. You may get drowsy or dizzy. Do not drive, use machinery, or do anything that needs mental alertness until you know how this medicine affects you. Do not stand or sit up quickly, especially if you are an older patient. This reduces the risk of dizzy or fainting spells. This medicine can cause you to bleed more easily. Try to avoid damage to your teeth and gums when you brush or floss your teeth. This medicine may be used to treat migraines. If you take migraine medicines for 10 or more days a month, your migraines may get worse. Keep a diary of headache days and medicine use. Contact your healthcare provider if your migraine attacks occur more frequently. What side effects may I notice from receiving this medicine? Side effects that you should report to your doctor or health care professional as soon as possible:  allergic reactions like skin rash, itching or hives, swelling of the face, lips, or tongue  redness, blistering, peeling or loosening of the skin, including inside the mouth  severe stomach pain  signs and symptoms of bleeding such as bloody or black, tarry stools; red or dark-brown urine; spitting up blood or brown material that looks like coffee grounds; red spots on the skin; unusual bruising or bleeding from the eye, gums, or nose  signs and symptoms of a blood clot such as changes in vision; chest pain; severe, sudden headache; trouble speaking; sudden numbness or weakness of the face, arm, or leg  unexplained weight gain or swelling  unusually weak or  tired  yellowing of eyes or skin Side effects that usually do not require medical attention (report to your doctor or health care professional if they continue or are bothersome):  bruising  diarrhea  dizziness, drowsiness  headache  nausea, vomiting This list may not describe all possible side effects. Call your doctor for medical advice about side effects. You may report side effects to FDA at 1-800-FDA-1088. Where should I keep my medicine? Keep out of the reach of children. Store at room temperature between 15 and 30 degrees C (59 and 86 degrees F). Keep container tightly closed. Throw away any unused medicine after the expiration date. NOTE: This sheet is a summary. It may not cover all possible information. If you have questions about this medicine, talk to your doctor, pharmacist, or health care provider.  2020 [employer]/Gold Standard (2018-05-18 14:11:00)

## 2018-10-04 ENCOUNTER — Telehealth: Payer: Self-pay

## 2018-10-05 MED FILL — ALBUTEROL SUL 2.5 MG/3 ML S: (2.5 MG/3ML | 12 days supply | Qty: 150 | Fill #1

## 2018-10-05 MED FILL — !VENTOLIN HFA INHALER: 108 (90 BAS | 25 days supply | Qty: 18 | Fill #1

## 2018-10-05 NOTE — Telephone Encounter (Signed)
Patient notified that he has refills on the script and that he can contact pharmacy for refill

## 2018-10-14 MED FILL — ?IBUPROFEN 800 MG TABS: AMNEAL | 10 days supply | Qty: 30 | Fill #0

## 2018-10-14 MED FILL — !VENTOLIN HFA INHALER: 108 (90 BAS | 25 days supply | Qty: 18 | Fill #1

## 2018-10-14 MED FILL — ALBUTEROL SUL 2.5 MG/3 ML S: (2.5 MG/3ML | 12 days supply | Qty: 150 | Fill #1

## 2018-10-14 MED FILL — LEVOTHYROXINE 150 MCG TAB: 150 | 30 days supply | Qty: 30 | Fill #1

## 2018-10-14 MED FILL — MONTELUKAST SOD 10 MG TAB: 10 | 30 days supply | Qty: 30 | Fill #1

## 2018-11-09 MED FILL — ALBUTEROL SUL 2.5 MG/3 ML S: (2.5 MG/3ML | 12 days supply | Qty: 150 | Fill #2

## 2018-11-09 MED FILL — !VENTOLIN HFA INHALER: 108 (90 BAS | 25 days supply | Qty: 18 | Fill #2

## 2018-11-09 MED FILL — ?AMLODIPINE BESYLATE 10 MG: 10 | 30 days supply | Qty: 30 | Fill #1

## 2018-11-15 ENCOUNTER — Other Ambulatory Visit: Payer: Self-pay

## 2018-11-15 DIAGNOSIS — Z20822 Contact with and (suspected) exposure to covid-19: Secondary | ICD-10-CM

## 2018-11-17 LAB — NOVEL CORONAVIRUS, NAA: SARS-CoV-2, NAA: NOT DETECTED

## 2018-11-25 ENCOUNTER — Telehealth: Payer: Self-pay

## 2018-11-25 MED FILL — ?IBUPROFEN 800 MG TABS: AMNEAL | 10 days supply | Qty: 30 | Fill #1

## 2018-11-25 MED FILL — ?AMLODIPINE BESYLATE 10 MG: 10 | 30 days supply | Qty: 30 | Fill #2

## 2018-11-25 MED FILL — MONTELUKAST SOD 10 MG TAB: 10 | 30 days supply | Qty: 30 | Fill #2

## 2018-11-25 MED FILL — ALBUTEROL SUL 2.5 MG/3 ML S: (2.5 MG/3ML | 12 days supply | Qty: 150 | Fill #3

## 2018-11-25 MED FILL — ALBUTEROL SULFATE HFA 108 (: 108 (90 BAS | 25 days supply | Qty: 18 | Fill #3

## 2018-11-25 NOTE — Telephone Encounter (Signed)
Pt has 11 refill on this inhaler. Spoke with pt ask him to call the pharmacy and them to refill his medication. Pt said that would contact his pharmacy for a refill.

## 2018-11-28 ENCOUNTER — Ambulatory Visit (HOSPITAL_COMMUNITY)
Admission: EM | Admit: 2018-11-28 | Discharge: 2018-11-28 | Disposition: A | Discharge status: Home / Self Care | Payer: Self-pay | Attending: Family Medicine | Admitting: Family Medicine

## 2018-11-28 ENCOUNTER — Encounter (HOSPITAL_COMMUNITY): Payer: Self-pay

## 2018-11-28 ENCOUNTER — Other Ambulatory Visit: Payer: Self-pay

## 2018-11-28 DIAGNOSIS — Z711 Person with feared health complaint in whom no diagnosis is made: Secondary | ICD-10-CM | POA: Insufficient documentation

## 2018-11-28 DIAGNOSIS — R1030 Lower abdominal pain, unspecified: Secondary | ICD-10-CM | POA: Insufficient documentation

## 2018-11-28 DIAGNOSIS — Z113 Encounter for screening for infections with a predominantly sexual mode of transmission: Secondary | ICD-10-CM

## 2018-11-28 DIAGNOSIS — M545 Low back pain: Secondary | ICD-10-CM

## 2018-11-28 DIAGNOSIS — Z202 Contact with and (suspected) exposure to infections with a predominantly sexual mode of transmission: Secondary | ICD-10-CM

## 2018-11-28 DIAGNOSIS — R350 Frequency of micturition: Secondary | ICD-10-CM

## 2018-11-28 LAB — POCT URINALYSIS DIP (DEVICE)
Bilirubin Urine: NEGATIVE
Glucose, UA: NEGATIVE mg/dL
Ketones, ur: NEGATIVE mg/dL
Leukocytes,Ua: NEGATIVE
Nitrite: NEGATIVE
Protein, ur: NEGATIVE mg/dL
Specific Gravity, Urine: >=1.03 (ref 1.005–1.030)
Urobilinogen, UA: 0.2 mg/dL (ref 0.0–1.0)
pH: 6 (ref 5.0–8.0)

## 2018-11-28 MED ORDER — CEFTRIAXONE SODIUM 250 MG IJ SOLR
INTRAMUSCULAR | Status: AC
  Filled 2018-11-28: qty 250

## 2018-11-28 MED ORDER — CEFTRIAXONE SODIUM 250 MG IJ SOLR
250.0000 mg | Freq: Once | INTRAMUSCULAR | Status: AC
  Administered 2018-11-28: 250 mg via INTRAMUSCULAR

## 2018-11-28 MED ORDER — AZITHROMYCIN 250 MG PO TABS
ORAL_TABLET | ORAL | Status: AC
  Filled 2018-11-28: qty 4

## 2018-11-28 MED ORDER — AZITHROMYCIN 250 MG PO TABS
1000.0000 mg | ORAL_TABLET | Freq: Once | ORAL | Status: AC
  Administered 2018-11-28: 10:00:00 1000 mg via ORAL
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
## 2018-11-28 NOTE — Discharge Instructions (Addendum)
We have treated you today for gonorrhea and chlamydia.  Please withhold from intercourse for the next week. Please use condoms to prevent STD's.   May start a probiotic to help with your bowel movements.  If symptoms persist please follow up with your primary care provider.  Any worsening of symptoms- fever, increased pain, increased diarrhea or blood in diarrhea, weakness- please return or go to the ER.

## 2018-11-28 NOTE — ED Triage Notes (Signed)
Patient presents to Urgent Care with complaints of lower abdominal pain and cramping since about a week ago. Patient reports the condom broke during intercourse and he is worried he has an STD.

## 2018-11-29 LAB — URINE CULTURE: Culture: NO GROWTH

## 2018-11-29 LAB — CYTOLOGY, (ORAL, ANAL, URETHRAL) ANCILLARY ONLY
Chlamydia: NEGATIVE
Neisseria Gonorrhea: NEGATIVE
Trichomonas: POSITIVE — AB

## 2018-11-30 ENCOUNTER — Telehealth (HOSPITAL_COMMUNITY): Payer: Self-pay | Admitting: Emergency Medicine

## 2018-11-30 MED ORDER — METRONIDAZOLE 500 MG PO TABS: 2000.0000 mg | ORAL_TABLET | Freq: Once | ORAL | 0 refills | Status: AC

## 2018-11-30 NOTE — Telephone Encounter (Signed)
Trichomonas is positive. Rx  for Flagyl 2 grams, once was sent to the pharmacy of record. Pt needs education to refrain from sexual intercourse for 7 days to give the medicine time to work. Sexual partners need to be notified and tested/treated. Condoms may reduce risk of reinfection. Recheck for further evaluation if symptoms are not improving.   Patient contacted and made aware of    results, all questions answered    

## 2018-12-01 MED FILL — metroNIDAZOLE 500 MG TABS: 500 | 1 days supply | Qty: 4 | Fill #0

## 2018-12-02 ENCOUNTER — Emergency Department (HOSPITAL_COMMUNITY)
Admission: EM | Admit: 2018-12-02 | Discharge: 2018-12-02 | Disposition: A | Discharge status: Home / Self Care | Payer: Self-pay | Attending: Emergency Medicine | Admitting: Emergency Medicine

## 2018-12-02 ENCOUNTER — Other Ambulatory Visit: Payer: Self-pay

## 2018-12-02 DIAGNOSIS — F1290 Cannabis use, unspecified, uncomplicated: Secondary | ICD-10-CM | POA: Insufficient documentation

## 2018-12-02 DIAGNOSIS — Z79899 Other long term (current) drug therapy: Secondary | ICD-10-CM | POA: Insufficient documentation

## 2018-12-02 DIAGNOSIS — N4889 Other specified disorders of penis: Secondary | ICD-10-CM | POA: Insufficient documentation

## 2018-12-02 DIAGNOSIS — Z85850 Personal history of malignant neoplasm of thyroid: Secondary | ICD-10-CM | POA: Insufficient documentation

## 2018-12-02 DIAGNOSIS — Z87891 Personal history of nicotine dependence: Secondary | ICD-10-CM | POA: Insufficient documentation

## 2018-12-02 DIAGNOSIS — A599 Trichomoniasis, unspecified: Secondary | ICD-10-CM | POA: Insufficient documentation

## 2018-12-02 DIAGNOSIS — R1030 Lower abdominal pain, unspecified: Secondary | ICD-10-CM | POA: Insufficient documentation

## 2018-12-02 DIAGNOSIS — R1031 Right lower quadrant pain: Secondary | ICD-10-CM

## 2018-12-02 LAB — URINALYSIS, ROUTINE W REFLEX MICROSCOPIC
Bilirubin Urine: NEGATIVE
Glucose, UA: NEGATIVE mg/dL
Hgb urine dipstick: NEGATIVE
Ketones, ur: NEGATIVE mg/dL
Leukocytes,Ua: NEGATIVE
Nitrite: NEGATIVE
Protein, ur: NEGATIVE mg/dL
Specific Gravity, Urine: 1.015 (ref 1.005–1.030)
pH: 7 (ref 5.0–8.0)

## 2018-12-02 MED ORDER — ONDANSETRON 4 MG PO TBDP
4.0000 mg | ORAL_TABLET | Freq: Once | ORAL | Status: AC
  Administered 2018-12-02: 15:00:00 4 mg via ORAL
  Filled 2018-12-02: qty 1

## 2018-12-02 MED ORDER — METRONIDAZOLE 500 MG PO TABS
2000.0000 mg | ORAL_TABLET | Freq: Once | ORAL | Status: AC
  Administered 2018-12-02: 2000 mg via ORAL
  Filled 2018-12-02: qty 4

## 2018-12-02 NOTE — ED Triage Notes (Signed)
Pt to ER for evaluation of blood in urine, swelling of his penis, and lower abdominal cramping. Reports thinks he may have an  STD.

## 2018-12-02 NOTE — Discharge Instructions (Signed)
You were seen in the ER for lower abdominal pain and discomfort.  You have trichomonas which is a sexually transmitted infection.  This is contagious.  You were treated here and they will further treatment is necessary.  You should not have sexual encounters for the next 10 days after treatment and until your symptoms completely resolved.  Wear condoms during any and all sexual encounters.  Notify all of the partners that you have had about her symptoms because they need to be tested and treated.  Your lower abdominal and groin discomfort may be from the infection but these are not usually classic symptoms.  Monitor and return to the ER for worsening abdominal pain, flank pain, burning with urination, fevers, chills, changes to your bowel movements, testicular pain.
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
## 2019-01-09 ENCOUNTER — Emergency Department (HOSPITAL_BASED_OUTPATIENT_CLINIC_OR_DEPARTMENT_OTHER)
Admission: EM | Admit: 2019-01-09 | Discharge: 2019-01-09 | Disposition: A | Discharge status: Home / Self Care | Payer: Self-pay | Attending: Emergency Medicine | Admitting: Emergency Medicine

## 2019-01-09 ENCOUNTER — Other Ambulatory Visit: Payer: Self-pay

## 2019-01-09 ENCOUNTER — Encounter (HOSPITAL_BASED_OUTPATIENT_CLINIC_OR_DEPARTMENT_OTHER): Payer: Self-pay | Admitting: *Deleted

## 2019-01-09 ENCOUNTER — Emergency Department (HOSPITAL_BASED_OUTPATIENT_CLINIC_OR_DEPARTMENT_OTHER): Payer: Self-pay

## 2019-01-09 DIAGNOSIS — Z79899 Other long term (current) drug therapy: Secondary | ICD-10-CM | POA: Insufficient documentation

## 2019-01-09 DIAGNOSIS — R0789 Other chest pain: Secondary | ICD-10-CM | POA: Insufficient documentation

## 2019-01-09 DIAGNOSIS — F1410 Cocaine abuse, uncomplicated: Secondary | ICD-10-CM | POA: Insufficient documentation

## 2019-01-09 DIAGNOSIS — F1910 Other psychoactive substance abuse, uncomplicated: Secondary | ICD-10-CM | POA: Insufficient documentation

## 2019-01-09 DIAGNOSIS — Z87891 Personal history of nicotine dependence: Secondary | ICD-10-CM | POA: Insufficient documentation

## 2019-01-09 DIAGNOSIS — F1210 Cannabis abuse, uncomplicated: Secondary | ICD-10-CM | POA: Insufficient documentation

## 2019-01-09 DIAGNOSIS — Z85850 Personal history of malignant neoplasm of thyroid: Secondary | ICD-10-CM | POA: Insufficient documentation

## 2019-01-09 DIAGNOSIS — I10 Essential (primary) hypertension: Secondary | ICD-10-CM | POA: Insufficient documentation

## 2019-01-09 DIAGNOSIS — E039 Hypothyroidism, unspecified: Secondary | ICD-10-CM | POA: Insufficient documentation

## 2019-01-09 LAB — COMPREHENSIVE METABOLIC PANEL
ALT: 29 U/L (ref 0–44)
AST: 26 U/L (ref 15–41)
Albumin: 4.6 g/dL (ref 3.5–5.0)
Alkaline Phosphatase: 59 U/L (ref 38–126)
Anion gap: 11 (ref 5–15)
BUN: 16 mg/dL (ref 6–20)
CO2: 28 mmol/L (ref 22–32)
Calcium: 9 mg/dL (ref 8.9–10.3)
Chloride: 101 mmol/L (ref 98–111)
Creatinine, Ser: 1.48 mg/dL — ABNORMAL HIGH (ref 0.61–1.24)
GFR calc Af Amer: >60 mL/min (ref >60)
GFR calc non Af Amer: 54 mL/min — ABNORMAL LOW (ref >60)
Glucose, Bld: 95 mg/dL (ref 70–99)
Potassium: 3.4 mmol/L — ABNORMAL LOW (ref 3.5–5.1)
Sodium: 140 mmol/L (ref 135–145)
Total Bilirubin: 0.8 mg/dL (ref 0.3–1.2)
Total Protein: 7.5 g/dL (ref 6.5–8.1)

## 2019-01-09 LAB — CBC
HCT: 44.8 % (ref 39.0–52.0)
Hemoglobin: 15 g/dL (ref 13.0–17.0)
MCH: 29.6 pg (ref 26.0–34.0)
MCHC: 33.5 g/dL (ref 30.0–36.0)
MCV: 88.4 fL (ref 80.0–100.0)
Platelets: 374 10*3/uL (ref 150–400)
RBC: 5.07 MIL/uL (ref 4.22–5.81)
RDW: 13.5 % (ref 11.5–15.5)
WBC: 7.7 10*3/uL (ref 4.0–10.5)
nRBC: 0 % (ref 0.0–0.2)

## 2019-01-09 LAB — URINALYSIS, MICROSCOPIC (REFLEX)

## 2019-01-09 LAB — URINALYSIS, ROUTINE W REFLEX MICROSCOPIC
Bilirubin Urine: NEGATIVE
Glucose, UA: NEGATIVE mg/dL
Hgb urine dipstick: NEGATIVE
Ketones, ur: NEGATIVE mg/dL
Nitrite: NEGATIVE
Protein, ur: NEGATIVE mg/dL
Specific Gravity, Urine: 1.025 (ref 1.005–1.030)
pH: 6 (ref 5.0–8.0)

## 2019-01-09 LAB — RAPID URINE DRUG SCREEN, HOSP PERFORMED
Amphetamines: NOT DETECTED
Barbiturates: NOT DETECTED
Benzodiazepines: NOT DETECTED
Cocaine: POSITIVE — AB
Opiates: NOT DETECTED
Tetrahydrocannabinol: POSITIVE — AB

## 2019-01-09 LAB — CBG MONITORING, ED: Glucose-Capillary: 116 mg/dL — ABNORMAL HIGH (ref 70–99)

## 2019-01-09 LAB — LIPASE, BLOOD: Lipase: 43 U/L (ref 11–51)

## 2019-01-09 LAB — TROPONIN I (HIGH SENSITIVITY): Troponin I (High Sensitivity): 6 ng/L (ref <18)

## 2019-01-09 MED ORDER — SODIUM CHLORIDE 0.9 % IV BOLUS: 1000.0000 mL | Freq: Once | INTRAVENOUS | Status: DC

## 2019-01-09 MED ORDER — DOCUSATE SODIUM 100 MG PO CAPS: 100.0000 mg | ORAL_CAPSULE | Freq: Two times a day (BID) | ORAL | 0 refills | Status: AC

## 2019-01-09 MED ORDER — KETOROLAC TROMETHAMINE 30 MG/ML IJ SOLN: 30.0000 mg | Freq: Once | INTRAMUSCULAR | Status: DC

## 2019-01-09 MED ORDER — FAMOTIDINE 20 MG PO TABS: 20.0000 mg | ORAL_TABLET | Freq: Every day | ORAL | 0 refills | Status: AC

## 2019-01-09 MED ORDER — SODIUM CHLORIDE 0.9 % IV SOLN: INTRAVENOUS | Status: DC

## 2019-01-09 MED FILL — !VENTOLIN HFA INHALER: 108 (90 BAS | 25 days supply | Qty: 18 | Fill #4

## 2019-01-09 NOTE — Discharge Instructions (Signed)
Avoid cocaine.

## 2019-01-09 NOTE — ED Triage Notes (Signed)
Pt reports right sided "deep itching" pain to his chest since right sided lung surgery 2 months ago. Pt denies any new symptoms today, just the intermittent ongoing "deep itch" to his right chest area, relieved with "thumping my chest, or scratching it real hard" denies any sob or other c/o. ekg performed at triage, given to md by [place], emt.

## 2019-01-09 NOTE — ED Notes (Signed)
ED Provider at bedside. 

## 2019-01-09 NOTE — ED Notes (Signed)
Pt refusing IV at this time- educated on use of IV and medications- pt continued to refuse. Straight stick used to obtain blood. Pt agreed to IV if needed later on. Pt reports "I am not hurting that bad and I do not need it".
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
## 2019-01-20 MED FILL — AMLODIPINE BESYLATE 10 MG T: 10 | 30 days supply | Qty: 30 | Fill #3

## 2019-01-20 MED FILL — MONTELUKAST SOD 10 MG TAB: 10 | 30 days supply | Qty: 30 | Fill #3

## 2019-02-06 ENCOUNTER — Telehealth: Payer: Self-pay

## 2019-02-06 ENCOUNTER — Other Ambulatory Visit: Payer: Self-pay | Admitting: Family Medicine

## 2019-02-06 ENCOUNTER — Other Ambulatory Visit: Payer: Self-pay

## 2019-02-06 DIAGNOSIS — R0989 Other specified symptoms and signs involving the circulatory and respiratory systems: Secondary | ICD-10-CM

## 2019-02-06 DIAGNOSIS — J45909 Unspecified asthma, uncomplicated: Secondary | ICD-10-CM

## 2019-02-06 MED ORDER — ALBUTEROL SULFATE HFA 108 (90 BASE) MCG/ACT IN AERS: 1.0000 | INHALATION_SPRAY | Freq: Four times a day (QID) | RESPIRATORY_TRACT | 3 refills | Status: DC | PRN

## 2019-02-06 MED ORDER — ALBUTEROL SULFATE (2.5 MG/3ML) 0.083% IN NEBU: 2.5000 mg | INHALATION_SOLUTION | Freq: Four times a day (QID) | RESPIRATORY_TRACT | 11 refills | Status: DC | PRN

## 2019-02-06 MED FILL — ALBUTEROL SUL 2.5 MG/3 ML S: (2.5 MG/3ML | 13 days supply | Qty: 150 | Fill #0

## 2019-02-06 NOTE — Progress Notes (Unsigned)
ne

## 2019-02-06 NOTE — Telephone Encounter (Signed)
He is asking if an rx for a machine [facility] so he can get through [employer]?

## 2019-02-06 NOTE — Telephone Encounter (Signed)
This has been faxed to [facility]. Patient is aware. Thanks!

## 2019-02-06 NOTE — Telephone Encounter (Signed)
Patient is requesting a new nebulizer machine [facility]. Can you write rx for this?

## 2019-02-13 DIAGNOSIS — Z736 Limitation of activities due to disability: Secondary | ICD-10-CM

## 2019-02-16 ENCOUNTER — Other Ambulatory Visit: Payer: Self-pay | Admitting: Family Medicine

## 2019-02-16 ENCOUNTER — Telehealth: Payer: Self-pay

## 2019-02-16 ENCOUNTER — Other Ambulatory Visit: Payer: Self-pay

## 2019-02-16 DIAGNOSIS — J45909 Unspecified asthma, uncomplicated: Secondary | ICD-10-CM

## 2019-02-16 MED ORDER — MONTELUKAST SODIUM 10 MG PO TABS: 10.0000 mg | ORAL_TABLET | Freq: Every day | ORAL | 3 refills | Status: DC

## 2019-02-16 MED ORDER — LEVOTHYROXINE SODIUM 150 MCG PO TABS: 150.0000 ug | ORAL_TABLET | Freq: Every day | ORAL | 3 refills | Status: DC

## 2019-02-16 MED FILL — MONTELUKAST SOD 10 MG TAB: 10 | 30 days supply | Qty: 30 | Fill #3

## 2019-02-16 MED FILL — LEVOTHYROXINE 150 MCG TAB: 150 | 30 days supply | Qty: 30 | Fill #0

## 2019-02-16 NOTE — Telephone Encounter (Signed)
Patient called and is asking for a refill on [facility] but this is not on current medication list. Can this be refilled?

## 2019-02-17 ENCOUNTER — Other Ambulatory Visit: Payer: Self-pay

## 2019-02-17 DIAGNOSIS — G8918 Other acute postprocedural pain: Secondary | ICD-10-CM

## 2019-02-17 MED ORDER — IBUPROFEN 800 MG PO TABS: 800.0000 mg | ORAL_TABLET | Freq: Three times a day (TID) | ORAL | 3 refills | Status: DC | PRN

## 2019-02-17 MED FILL — IBUPROFEN 800 MG TABLET: 800 | 10 days supply | Qty: 30 | Fill #0

## 2019-02-17 NOTE — Telephone Encounter (Signed)
Patient informed. Thanks!

## 2019-02-27 MED FILL — !VENTOLIN HFA INHALER: 108 (90 BAS | 25 days supply | Qty: 18 | Fill #1

## 2019-03-17 DIAGNOSIS — E559 Vitamin D deficiency, unspecified: Secondary | ICD-10-CM

## 2019-03-17 HISTORY — DX: Vitamin D deficiency, unspecified: E55.9

## 2019-03-24 ENCOUNTER — Other Ambulatory Visit: Payer: Self-pay

## 2019-03-24 ENCOUNTER — Ambulatory Visit (INDEPENDENT_AMBULATORY_CARE_PROVIDER_SITE_OTHER): Payer: Self-pay | Admitting: Family Medicine

## 2019-03-24 ENCOUNTER — Encounter: Payer: Self-pay | Admitting: Family Medicine

## 2019-03-24 VITALS — BP 136/90 | HR 92 | Temp 98.1°F | Ht 74.0 in | Wt 233.8 lb

## 2019-03-24 DIAGNOSIS — F419 Anxiety disorder, unspecified: Secondary | ICD-10-CM

## 2019-03-24 DIAGNOSIS — Z0000 Encounter for general adult medical examination without abnormal findings: Secondary | ICD-10-CM

## 2019-03-24 DIAGNOSIS — M25541 Pain in joints of right hand: Secondary | ICD-10-CM

## 2019-03-24 DIAGNOSIS — F329 Major depressive disorder, single episode, unspecified: Secondary | ICD-10-CM

## 2019-03-24 DIAGNOSIS — M25542 Pain in joints of left hand: Secondary | ICD-10-CM

## 2019-03-24 DIAGNOSIS — Z09 Encounter for follow-up examination after completed treatment for conditions other than malignant neoplasm: Secondary | ICD-10-CM

## 2019-03-24 DIAGNOSIS — I10 Essential (primary) hypertension: Secondary | ICD-10-CM

## 2019-03-24 DIAGNOSIS — J45909 Unspecified asthma, uncomplicated: Secondary | ICD-10-CM

## 2019-03-24 LAB — POCT URINALYSIS DIPSTICK
Bilirubin, UA: NEGATIVE
Blood, UA: NEGATIVE
Glucose, UA: NEGATIVE
Ketones, UA: NEGATIVE
Leukocytes, UA: NEGATIVE
Nitrite, UA: NEGATIVE
Protein, UA: NEGATIVE
Spec Grav, UA: 1.02 (ref 1.010–1.025)
Urobilinogen, UA: 0.2 E.U./dL
pH, UA: 6 (ref 5.0–8.0)

## 2019-03-24 LAB — POCT GLYCOSYLATED HEMOGLOBIN (HGB A1C): Hemoglobin A1C: 5.3 % (ref 4.0–5.6)

## 2019-03-24 MED ORDER — PANTOPRAZOLE SODIUM 20 MG PO TBEC: 20.0000 mg | DELAYED_RELEASE_TABLET | Freq: Every day | ORAL | 6 refills | Status: AC

## 2019-03-24 MED FILL — PANTOPRAZOLE SOD DR 20 MG T: 20 | 30 days supply | Qty: 30 | Fill #0
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
## 2019-03-28 LAB — RHEUMATOID ARTHRITIS PROFILE
Cyclic Citrullin Peptide Ab: 3 units (ref 0–19)
Rheumatoid fact SerPl-aCnc: <10 IU/mL (ref 0.0–13.9)

## 2019-03-28 LAB — VITAMIN B12: Vitamin B-12: 540 pg/mL (ref 232–1245)

## 2019-03-28 LAB — VITAMIN D 25 HYDROXY (VIT D DEFICIENCY, FRACTURES): Vit D, 25-Hydroxy: 20.7 ng/mL — ABNORMAL LOW (ref 30.0–100.0)

## 2019-03-28 LAB — TSH: TSH: 1.11 u[IU]/mL (ref 0.450–4.500)

## 2019-03-28 LAB — URIC ACID: Uric Acid: 6.4 mg/dL (ref 3.8–8.4)

## 2019-03-31 ENCOUNTER — Other Ambulatory Visit: Payer: Self-pay | Admitting: Family Medicine

## 2019-03-31 ENCOUNTER — Encounter: Payer: Self-pay | Admitting: Family Medicine

## 2019-03-31 ENCOUNTER — Other Ambulatory Visit: Payer: Self-pay

## 2019-03-31 DIAGNOSIS — E559 Vitamin D deficiency, unspecified: Secondary | ICD-10-CM

## 2019-03-31 DIAGNOSIS — J45909 Unspecified asthma, uncomplicated: Secondary | ICD-10-CM

## 2019-03-31 MED ORDER — LEVOTHYROXINE SODIUM 150 MCG PO TABS: 150.0000 ug | ORAL_TABLET | Freq: Every day | ORAL | 3 refills | Status: DC

## 2019-03-31 MED ORDER — VITAMIN D (ERGOCALCIFEROL) 1.25 MG (50000 UNIT) PO CAPS: 50000.0000 [IU] | ORAL_CAPSULE | ORAL | 2 refills | Status: AC

## 2019-03-31 MED ORDER — MONTELUKAST SODIUM 10 MG PO TABS: 10.0000 mg | ORAL_TABLET | Freq: Every day | ORAL | 3 refills | Status: DC

## 2019-03-31 MED ORDER — ALBUTEROL SULFATE HFA 108 (90 BASE) MCG/ACT IN AERS: 1.0000 | INHALATION_SPRAY | Freq: Four times a day (QID) | RESPIRATORY_TRACT | 3 refills | Status: DC | PRN

## 2019-03-31 MED FILL — LEVOTHYROXINE 150 MCG TAB: 150 | 30 days supply | Qty: 30 | Fill #0

## 2019-03-31 MED FILL — ALBUTEROL SULFATE HFA 108 (: 108 (90 BAS | 25 days supply | Qty: 18 | Fill #0

## 2019-03-31 MED FILL — MONTELUKAST SOD 10 MG TAB: 10 | 30 days supply | Qty: 30 | Fill #0

## 2019-04-03 ENCOUNTER — Telehealth: Payer: Self-pay

## 2019-04-03 MED FILL — VIT D2 1.25 MG (50,000 UNIT: 1.25 MG | 30 days supply | Qty: 5 | Fill #0

## 2019-04-03 NOTE — Telephone Encounter (Signed)
Patient called back regarding message left. I informed of low vitamin D and to take vitamin D as directed. Advised that all labs are stable and no other changes. Asked to keep next scheduled appointment.

## 2019-04-03 NOTE — Telephone Encounter (Signed)
Azzie Glatter, FNP  Jorja Loa, RMA  Vitamin D level is extremely low. Rx for Vitamin D supplement sent to pharmacy today. He should include foods that are high in Vitamin D. These include: Salmon, Cod Liver Oil, Mushrooms, Canned Fish, Milk, and Egg Yolks.   All other labs are stable. Keep follow up appointment. Please inform patient. Thank you.     Message left on patient's voice mail.

## 2019-04-10 IMAGING — DX DG CHEST 1V PORT
2 series · 2 of 2 positions shown · non-contrast
Comparison: 08/08/2017

CLINICAL DATA: Chest pain with shortness of breath

EXAM:
PORTABLE CHEST 1 VIEW

[chest ap (1 of 2)]
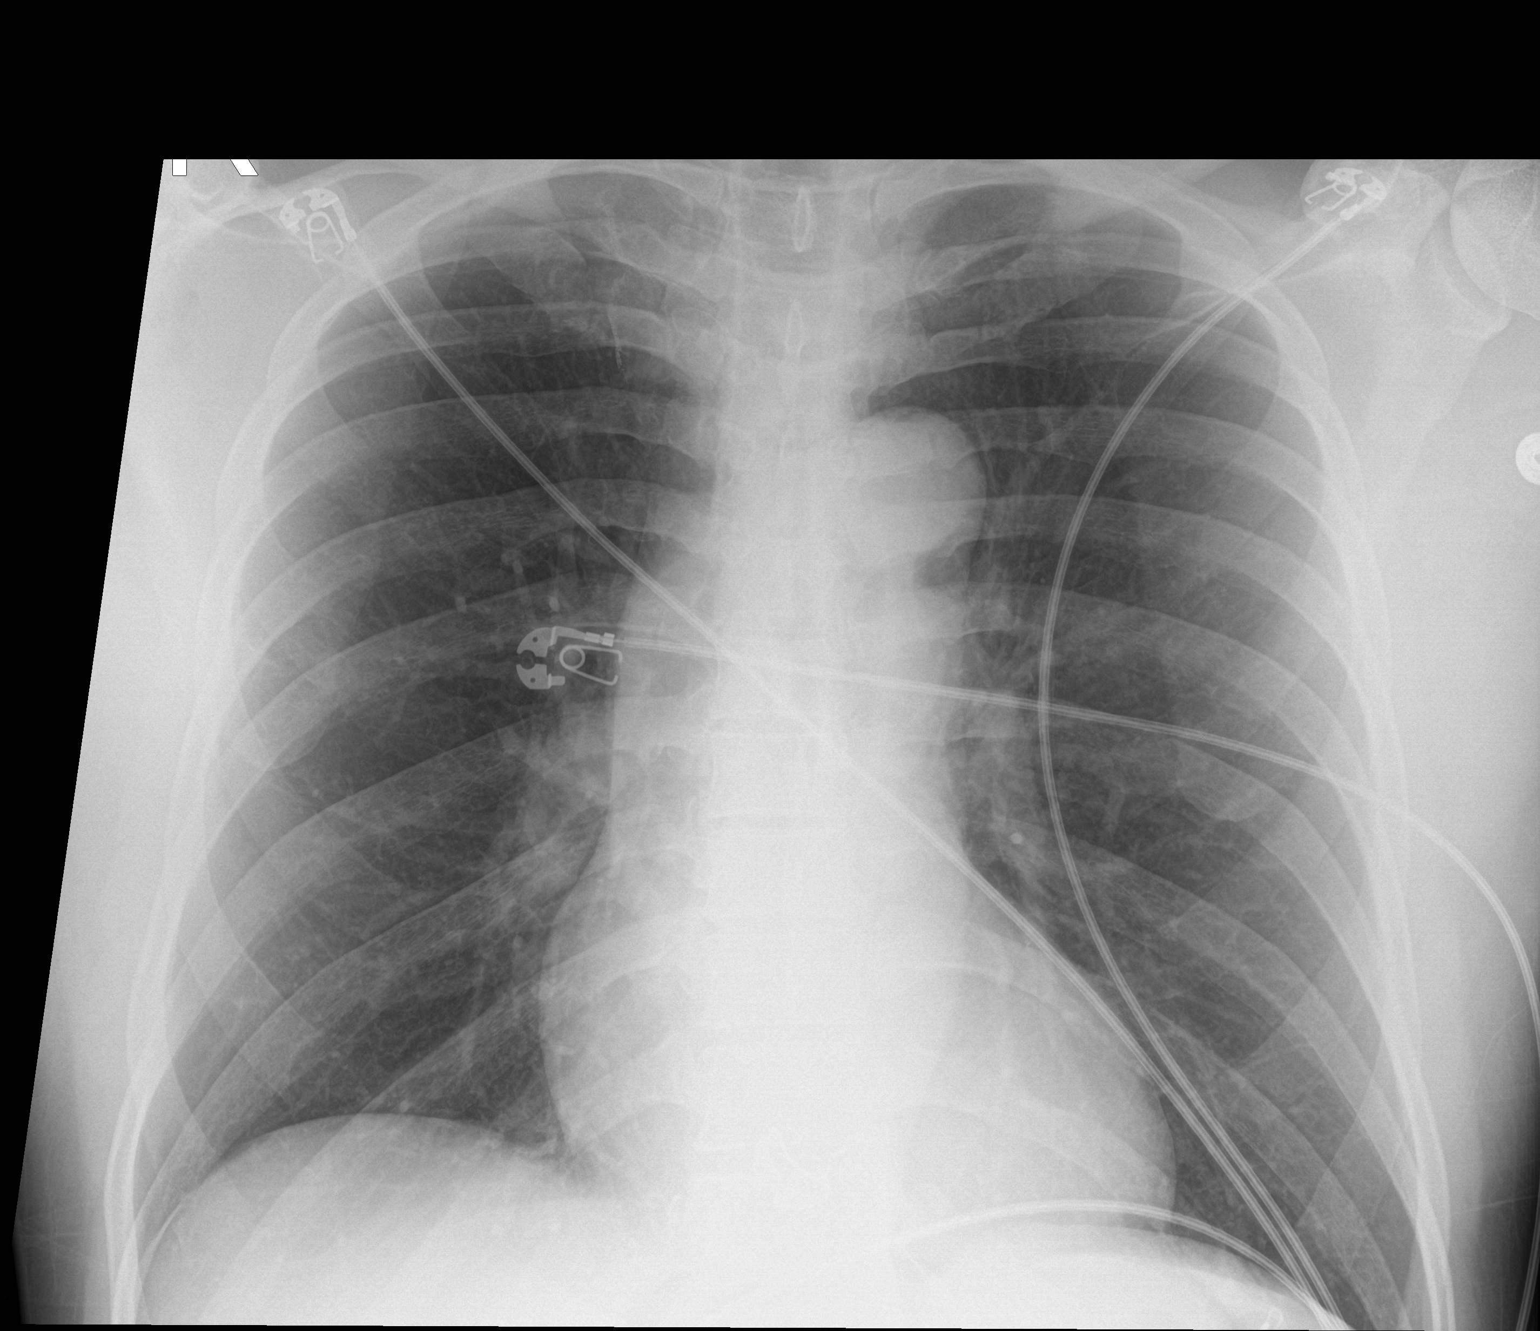

[chest ap (2 of 2)]
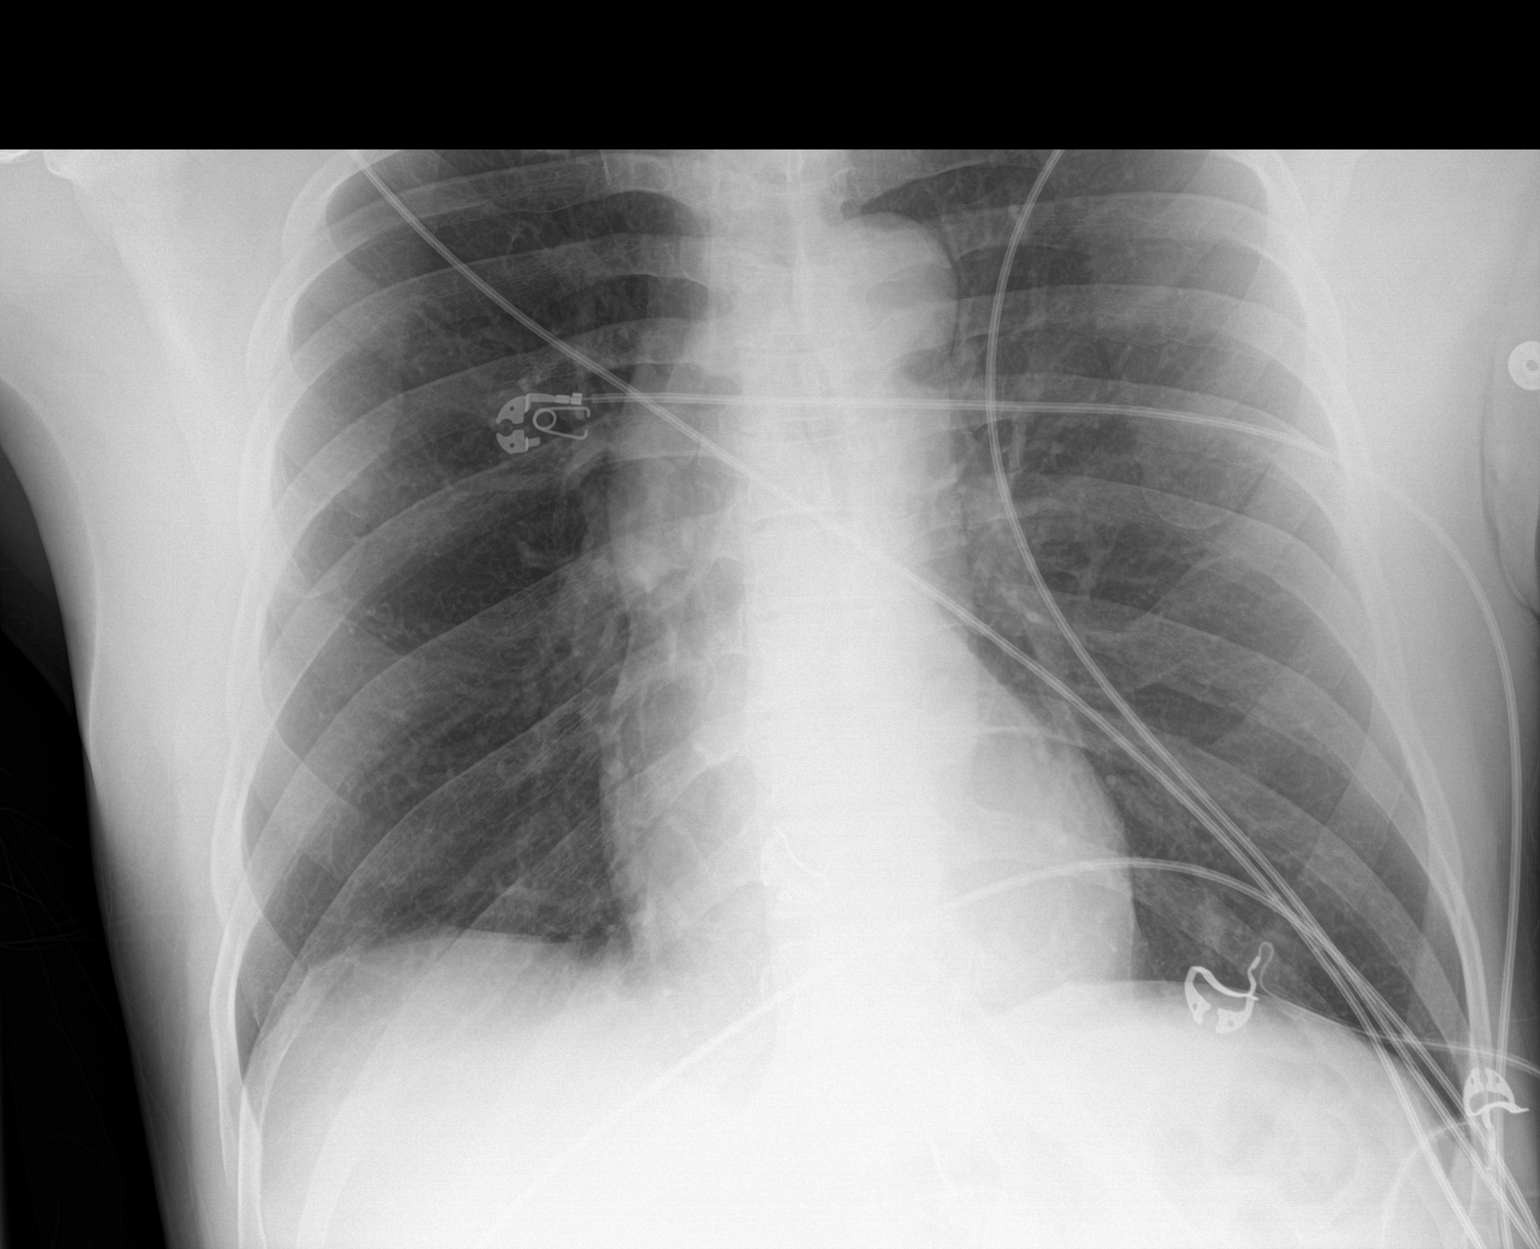

[2 of 2 positions shown; findings below may reference images not displayed]

FINDINGS: The heart size and mediastinal contours are within normal limits.
Both lungs are clear. The visualized skeletal structures are
unremarkable.
IMPRESSION: No active disease.

## 2019-04-18 ENCOUNTER — Other Ambulatory Visit: Payer: Self-pay

## 2019-04-18 DIAGNOSIS — J45909 Unspecified asthma, uncomplicated: Secondary | ICD-10-CM

## 2019-04-18 DIAGNOSIS — G8918 Other acute postprocedural pain: Secondary | ICD-10-CM

## 2019-04-18 DIAGNOSIS — R0989 Other specified symptoms and signs involving the circulatory and respiratory systems: Secondary | ICD-10-CM

## 2019-04-18 MED ORDER — IBUPROFEN 800 MG PO TABS: 800.0000 mg | ORAL_TABLET | Freq: Three times a day (TID) | ORAL | 3 refills | Status: AC | PRN

## 2019-04-18 MED ORDER — ALBUTEROL SULFATE (2.5 MG/3ML) 0.083% IN NEBU: 2.5000 mg | INHALATION_SOLUTION | Freq: Four times a day (QID) | RESPIRATORY_TRACT | 11 refills | Status: DC | PRN

## 2019-04-18 MED FILL — IBUPROFEN 800 MG TABLET: 800 | 10 days supply | Qty: 30 | Fill #0

## 2019-04-18 MED FILL — ALBUTEROL SUL 2.5 MG/3 ML S: (2.5 MG/3ML | 12 days supply | Qty: 150 | Fill #0

## 2019-04-19 ENCOUNTER — Other Ambulatory Visit: Payer: Self-pay

## 2019-04-19 DIAGNOSIS — J45909 Unspecified asthma, uncomplicated: Secondary | ICD-10-CM

## 2019-04-19 MED ORDER — ALBUTEROL SULFATE HFA 108 (90 BASE) MCG/ACT IN AERS: 1.0000 | INHALATION_SPRAY | Freq: Four times a day (QID) | RESPIRATORY_TRACT | 3 refills | Status: AC | PRN

## 2019-04-19 MED FILL — ALBUTEROL SULFATE HFA 108 (: 108 (90 BAS | 25 days supply | Qty: 18 | Fill #0

## 2019-05-03 ENCOUNTER — Other Ambulatory Visit: Payer: Self-pay

## 2019-05-03 DIAGNOSIS — J45909 Unspecified asthma, uncomplicated: Secondary | ICD-10-CM

## 2019-05-03 MED ORDER — MONTELUKAST SODIUM 10 MG PO TABS: 10.0000 mg | ORAL_TABLET | Freq: Every day | ORAL | 3 refills | Status: AC

## 2019-05-03 MED FILL — MONTELUKAST SOD 10 MG TAB: 10 | 30 days supply | Qty: 30 | Fill #0

## 2019-05-10 MED FILL — ALBUTEROL SULFATE HFA 108 (: 108 (90 BAS | 25 days supply | Qty: 18 | Fill #1

## 2019-05-15 ENCOUNTER — Other Ambulatory Visit: Payer: Self-pay

## 2019-05-15 DIAGNOSIS — J45909 Unspecified asthma, uncomplicated: Secondary | ICD-10-CM

## 2019-05-15 DIAGNOSIS — R0989 Other specified symptoms and signs involving the circulatory and respiratory systems: Secondary | ICD-10-CM

## 2019-05-15 MED ORDER — ALBUTEROL SULFATE (2.5 MG/3ML) 0.083% IN NEBU: 2.5000 mg | INHALATION_SOLUTION | Freq: Four times a day (QID) | RESPIRATORY_TRACT | 11 refills | Status: AC | PRN

## 2019-05-15 MED ORDER — LEVOTHYROXINE SODIUM 150 MCG PO TABS: 150.0000 ug | ORAL_TABLET | Freq: Every day | ORAL | 3 refills | Status: AC

## 2019-05-15 MED FILL — ALBUTEROL SUL 2.5 MG/3 ML S: (2.5 MG/3ML | 15 days supply | Qty: 150 | Fill #0

## 2019-05-17 MED FILL — LEVOTHYROXINE SODIUM 150 MC: 150 | 30 days supply | Qty: 30 | Fill #0

## 2019-06-06 MED FILL — AMLODIPINE BESYLATE 10 MG T: 10 | 30 days supply | Qty: 30 | Fill #3

## 2019-06-06 MED FILL — MONTELUKAST SOD 10 MG TAB: 10 | 30 days supply | Qty: 30 | Fill #1

## 2019-06-06 MED FILL — ALBUTEROL SULFATE HFA 108 (: 108 (90 BAS | 25 days supply | Qty: 18 | Fill #2

## 2019-09-22 ENCOUNTER — Ambulatory Visit: Payer: Self-pay | Admitting: Family Medicine

## 2019-10-19 IMAGING — DX PORTABLE CHEST - 1 VIEW
1 series · 1 of 1 positions shown · non-contrast
Comparison: 08/29/2018, CT 08/29/2018

CLINICAL DATA: 48-year-old male with a history of spontaneous
pneumothorax

EXAM:
PORTABLE CHEST 1 VIEW

[chest ap]
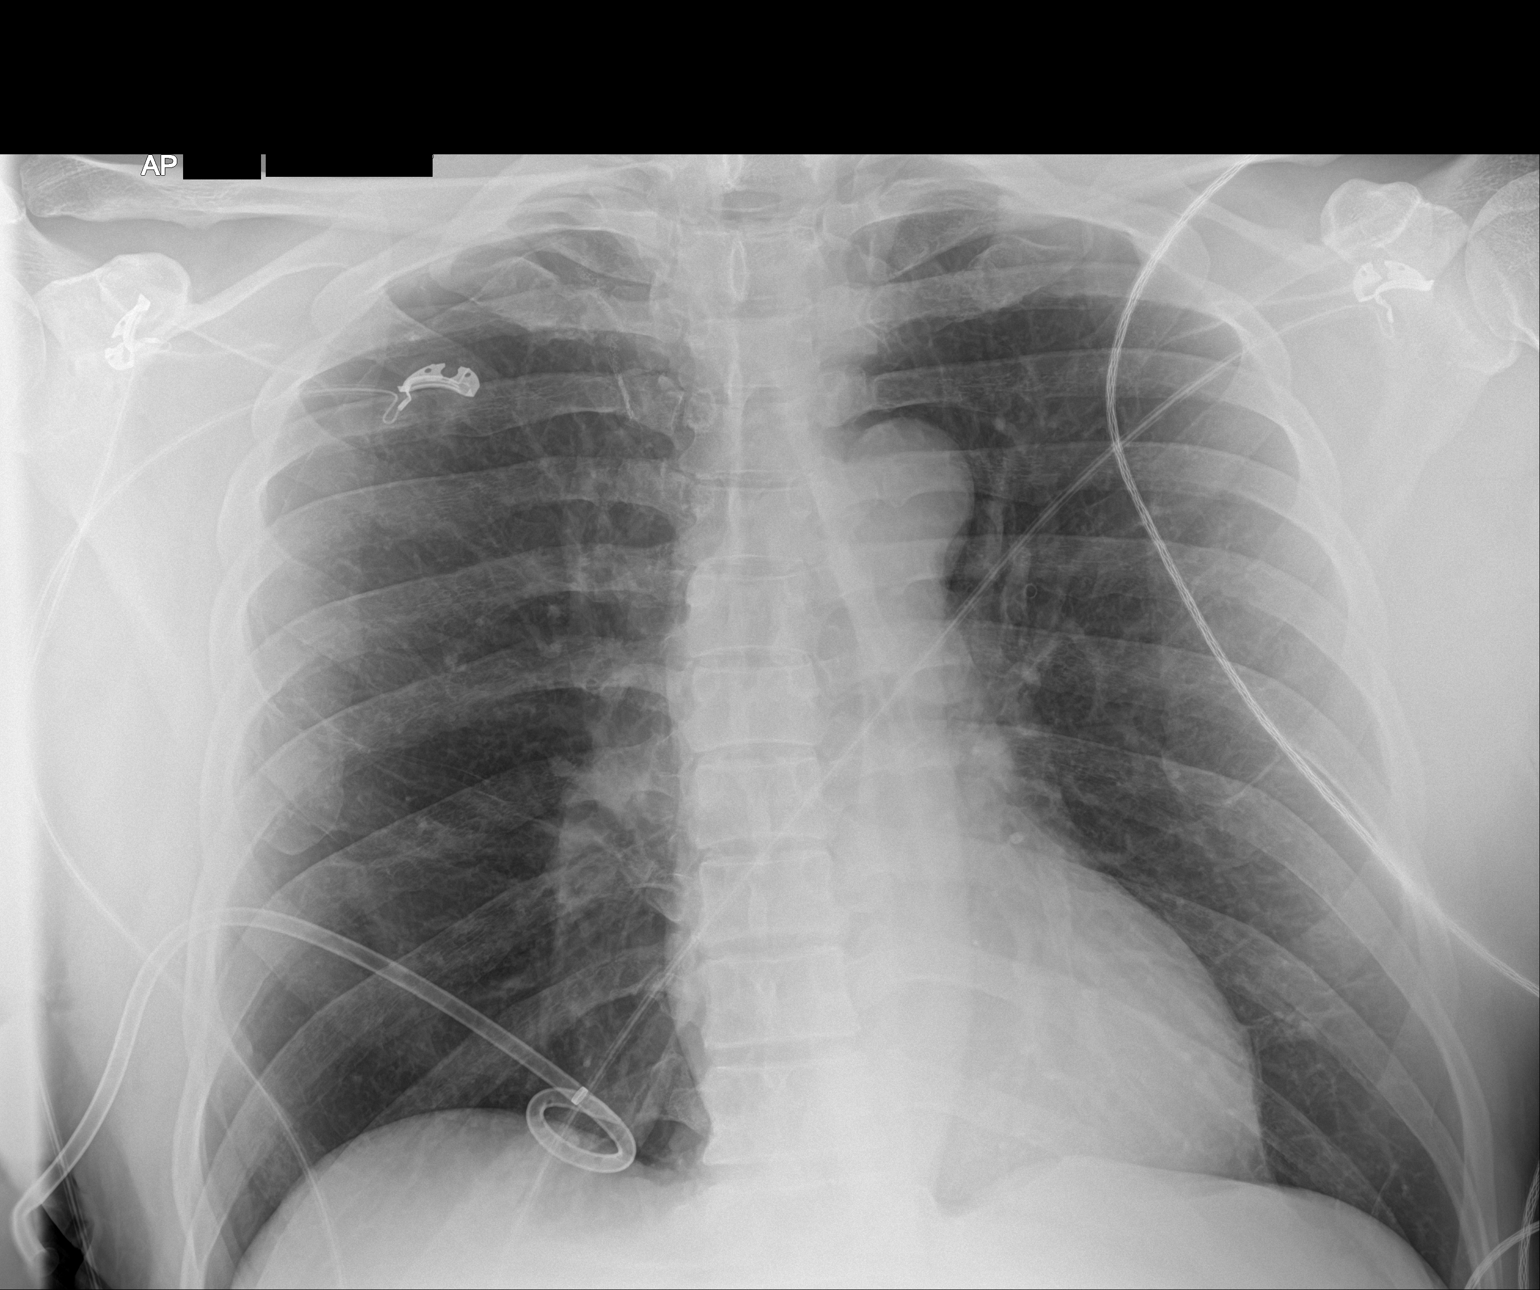

[1 of 1 positions shown; findings below may reference images not displayed]

FINDINGS: Cardiomediastinal silhouette unchanged in size and contour. No
confluent airspace disease or pleural effusion.

Improving opacity at the right lung base with unchanged position of
the right thoracostomy tube. Small residual pneumothorax at the
apex.
IMPRESSION: Unchanged right thoracostomy tube, with persistent small right
pneumothorax.

## 2022-09-07 ENCOUNTER — Other Ambulatory Visit: Payer: Self-pay

## 2022-09-07 MED ORDER — VRAYLAR 3 MG PO CAPS: ORAL_CAPSULE | ORAL | 3 refills | Status: DC

## 2022-09-08 ENCOUNTER — Other Ambulatory Visit: Payer: Self-pay

## 2023-01-21 DIAGNOSIS — R109 Unspecified abdominal pain: Secondary | ICD-10-CM | POA: Diagnosis not present

## 2023-01-21 DIAGNOSIS — Z113 Encounter for screening for infections with a predominantly sexual mode of transmission: Secondary | ICD-10-CM | POA: Diagnosis not present

## 2023-05-18 DIAGNOSIS — F32A Depression, unspecified: Secondary | ICD-10-CM | POA: Diagnosis not present

## 2023-05-18 DIAGNOSIS — Z1211 Encounter for screening for malignant neoplasm of colon: Secondary | ICD-10-CM | POA: Diagnosis not present

## 2023-05-18 DIAGNOSIS — Z23 Encounter for immunization: Secondary | ICD-10-CM | POA: Diagnosis not present

## 2023-05-18 DIAGNOSIS — Z125 Encounter for screening for malignant neoplasm of prostate: Secondary | ICD-10-CM | POA: Diagnosis not present

## 2023-05-18 DIAGNOSIS — Z79899 Other long term (current) drug therapy: Secondary | ICD-10-CM | POA: Diagnosis not present

## 2023-05-18 DIAGNOSIS — F419 Anxiety disorder, unspecified: Secondary | ICD-10-CM | POA: Diagnosis not present

## 2023-05-18 DIAGNOSIS — Z0000 Encounter for general adult medical examination without abnormal findings: Secondary | ICD-10-CM | POA: Diagnosis not present

## 2023-09-09 ENCOUNTER — Other Ambulatory Visit: Payer: Self-pay

## 2023-09-09 MED ORDER — VRAYLAR 3 MG PO CAPS
3.0000 mg | ORAL_CAPSULE | Freq: Every day | ORAL | 0 refills | Status: AC
  Filled 2023-09-09: qty 30, 30d supply, fill #0

## 2023-09-09 MED ORDER — ESCITALOPRAM OXALATE 20 MG PO TABS
20.0000 mg | ORAL_TABLET | Freq: Every day | ORAL | 0 refills | Status: AC
  Filled 2023-09-09: qty 90, 90d supply, fill #0

## 2023-09-22 ENCOUNTER — Other Ambulatory Visit: Payer: Self-pay

## 2023-11-11 ENCOUNTER — Encounter: Payer: Self-pay | Admitting: Family Medicine

## 2023-12-06 ENCOUNTER — Encounter: Payer: Self-pay | Admitting: Pediatrics

## 2023-12-20 DIAGNOSIS — F3130 Bipolar disorder, current episode depressed, mild or moderate severity, unspecified: Secondary | ICD-10-CM | POA: Diagnosis not present

## 2023-12-20 DIAGNOSIS — M25511 Pain in right shoulder: Secondary | ICD-10-CM | POA: Diagnosis not present

## 2023-12-20 DIAGNOSIS — G8929 Other chronic pain: Secondary | ICD-10-CM | POA: Diagnosis not present

## 2023-12-22 ENCOUNTER — Ambulatory Visit (AMBULATORY_SURGERY_CENTER)

## 2023-12-22 VITALS — Ht 74.0 in | Wt 230.0 lb

## 2023-12-22 DIAGNOSIS — Z860100 Personal history of colon polyps, unspecified: Secondary | ICD-10-CM

## 2023-12-22 MED ORDER — PEG 3350-KCL-NA BICARB-NACL 420 G PO SOLR: 4000.0000 mL | Freq: Once | ORAL | 0 refills | Status: AC

## 2023-12-22 NOTE — Progress Notes (Signed)

## 2024-01-06 ENCOUNTER — Ambulatory Visit: Payer: Self-pay
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
## 2024-01-12 ENCOUNTER — Encounter: Payer: Self-pay | Admitting: Pediatrics

## 2024-01-12 ENCOUNTER — Ambulatory Visit (AMBULATORY_SURGERY_CENTER): Admitting: Pediatrics

## 2024-01-12 VITALS — BP 119/78 | HR 60 | Temp 97.2°F | Resp 12 | Ht 74.0 in | Wt 230.0 lb

## 2024-01-12 DIAGNOSIS — Z860101 Personal history of adenomatous and serrated colon polyps: Secondary | ICD-10-CM

## 2024-01-12 DIAGNOSIS — D123 Benign neoplasm of transverse colon: Secondary | ICD-10-CM

## 2024-01-12 DIAGNOSIS — K648 Other hemorrhoids: Secondary | ICD-10-CM

## 2024-01-12 DIAGNOSIS — K635 Polyp of colon: Secondary | ICD-10-CM | POA: Diagnosis not present

## 2024-01-12 DIAGNOSIS — D124 Benign neoplasm of descending colon: Secondary | ICD-10-CM

## 2024-01-12 DIAGNOSIS — Z1211 Encounter for screening for malignant neoplasm of colon: Secondary | ICD-10-CM

## 2024-01-12 DIAGNOSIS — D120 Benign neoplasm of cecum: Secondary | ICD-10-CM | POA: Diagnosis not present

## 2024-01-12 DIAGNOSIS — I10 Essential (primary) hypertension: Secondary | ICD-10-CM | POA: Diagnosis not present

## 2024-01-12 DIAGNOSIS — D128 Benign neoplasm of rectum: Secondary | ICD-10-CM

## 2024-01-12 NOTE — Progress Notes (Signed)
 Called to room to assist during endoscopic procedure.  Patient ID and intended procedure confirmed with present staff. Received instructions for my participation in the procedure from the performing physician.

## 2024-01-12 NOTE — Op Note (Signed)
 [facility] Patient Name: Nicholas Mccarty Procedure Date: 01/12/2024 8:59 AM MRN: [id number] Endoscopist: Inocente Hausen , MD, [id number] Age: 53 Referring MD:  Date of Birth: 07-17-1970 Gender: Male Account #: [id number] Procedure:                Colonoscopy Indications:              High risk colon cancer surveillance: Personal                            history of non-advanced adenoma, Last colonoscopy:                            October 2019 Medicines:                Monitored Anesthesia Care Procedure:                Pre-Anesthesia Assessment:                           - Prior to the procedure, a History and Physical                            was performed, and patient medications and                            allergies were reviewed. The patient's tolerance of                            previous anesthesia was also reviewed. The risks                            and benefits of the procedure and the sedation                            options and risks were discussed with the patient.                            All questions were answered, and informed consent                            was obtained. Prior Anticoagulants: The patient has                            taken no anticoagulant or antiplatelet agents. ASA                            Grade Assessment: III - A patient with severe                            systemic disease. After reviewing the risks and                            benefits, the patient was deemed in satisfactory  condition to undergo the procedure.                           After obtaining informed consent, the colonoscope                            was passed under direct vision. Throughout the                            procedure, the patient's blood pressure, pulse, and                            oxygen saturations were monitored continuously. The                            Colonoscope was introduced through the anus and                             advanced to the the cecum, identified by                            appendiceal orifice and ileocecal valve. The                            colonoscopy was performed without difficulty. The                            patient tolerated the procedure well. The quality                            of the bowel preparation was adequate to identify                            polyps greater than 5 mm in size. The ileocecal                            valve, appendiceal orifice, and rectum were                            photographed. Scope In: 9:04:11 AM Scope Out: 9:23:54 AM Scope Withdrawal Time: 0 hours 16 minutes 37 seconds  Total Procedure Duration: 0 hours 19 minutes 43 seconds  Findings:                 Hemorrhoids were found on perianal exam.                           The digital rectal exam was normal. Pertinent                            negatives include normal sphincter tone and no                            palpable rectal lesions.  Two sessile polyps were found in the transverse                            colon and cecum. The polyps were 3 to 4 mm in size.                            These polyps were removed with a cold biopsy                            forceps. Resection and retrieval were complete.                           Four sessile polyps were found in the rectum,                            descending colon and transverse colon. The polyps                            were 5 to 9 mm in size. These polyps were removed                            with a cold snare. Resection and retrieval were                            complete.                           Internal hemorrhoids were found during retroflexion. Complications:            No immediate complications. Estimated blood loss:                            Minimal. Estimated Blood Loss:     Estimated blood loss was minimal. Impression:               - Hemorrhoids found on  perianal exam.                           - Two 3 to 4 mm polyps in the transverse colon and                            in the cecum, removed with a cold biopsy forceps.                            Resected and retrieved.                           - Four 5 to 9 mm polyps in the rectum, in the                            descending colon and in the transverse colon,                            removed  with a cold snare. Resected and retrieved.                           - Internal hemorrhoids. Recommendation:           - Discharge patient to home (ambulatory).                           - Await pathology results.                           - Repeat colonoscopy for surveillance based on                            pathology results.                           - The findings and recommendations were discussed                            with the patient's family.                           - Patient has a contact number available for                            emergencies. The signs and symptoms of potential                            delayed complications were discussed with the                            patient. Return to normal activities tomorrow.                            Written discharge instructions were provided to the                            patient. Inocente Hausen, MD 01/12/2024 9:30:04 AM This report has been signed electronically.

## 2024-01-12 NOTE — Progress Notes (Signed)
 Continue previous diet & medications

## 2024-01-12 NOTE — Progress Notes (Signed)
 Sedate, gd SR, tolerated procedure well, VSS, report to RN

## 2024-01-12 NOTE — Progress Notes (Signed)
 Patient denied any use of cocaine in couple of yers

## 2024-01-12 NOTE — Patient Instructions (Signed)

## 2024-01-12 NOTE — Progress Notes (Signed)
 Informed T Blocker,CRNA that patient drank rest of prep @ 0645 today

## 2024-01-13 ENCOUNTER — Telehealth: Payer: Self-pay

## 2024-01-13 NOTE — Telephone Encounter (Signed)
  Follow up Call-     01/12/2024    7:35 AM  Call back number  Post procedure Call Back phone  # [phone]  Permission to leave phone message Yes     Patient questions:  Do you have a fever, pain , or abdominal swelling? No. Pain Score  0 *  Have you tolerated food without any problems? Yes.    Have you been able to return to your normal activities? Yes.    Do you have any questions about your discharge instructions: Diet   No. Medications  No. Follow up visit  No.  Do you have questions or concerns about your Care? No.  Actions: * If pain score is 4 or above: No action needed, pain <4.

## 2024-01-14 DIAGNOSIS — E890 Postprocedural hypothyroidism: Secondary | ICD-10-CM | POA: Diagnosis not present

## 2024-01-14 DIAGNOSIS — G8929 Other chronic pain: Secondary | ICD-10-CM | POA: Diagnosis not present

## 2024-01-14 DIAGNOSIS — M25511 Pain in right shoulder: Secondary | ICD-10-CM | POA: Diagnosis not present

## 2024-01-14 DIAGNOSIS — M7501 Adhesive capsulitis of right shoulder: Secondary | ICD-10-CM | POA: Diagnosis not present

## 2024-01-14 LAB — SURGICAL PATHOLOGY

## 2024-01-14 NOTE — Progress Notes (Signed)
 Large Joint Injection: R glenohumeral  Date/Time: 01/14/2024 1:20 PM  Performed by: Cole Aureliano [place], PA Authorized by: Cole Aureliano [place], PA   Procedure discussed: discussed risks, benefits, and alternatives   Consent Given by:  Patient Timeout: timeout called immediately prior to procedure   Prep: patient was prepped and draped in usual sterile fashion   Indications:  Pain Needle Size:  25 G Approach:  Anterior Location:  Shoulder Site:  R glenohumeral Topical skin anesthesia: obtained using ethyl chloride spray   Medications:  40 mg triamcinolone acetonide 40 mg/mL; 4 mL lidocaine  (PF) 1 % Outcome:  Tolerated well, no immediate complications Dressing:  4x4 sterile gauze Patient tolerance:  Patient tolerated the procedure well Instructions: patient was counseled as to the expected post injection course, including the possibility of temporary worsening of symptoms   patient was instructed as to concerning symptoms or signs and instructed to contact the office if these should appear    Answers submitted by the patient for this visit: Right Shoulder HPI Questionnaire (Submitted on 01/14/2024) Chief Complaint: HPI - Right Shoulder How did your symptoms begin?: gradually without injury How long ago did your symptoms begin?: 3 Months Questionnaire about: HPI - Right Shoulder (Submitted on 01/14/2024) Chief Complaint: HPI - Right Shoulder

## 2024-01-16 ENCOUNTER — Ambulatory Visit: Payer: Self-pay | Admitting: Pediatrics

## 2024-02-22 DIAGNOSIS — F32A Depression, unspecified: Secondary | ICD-10-CM | POA: Diagnosis not present

## 2024-02-22 DIAGNOSIS — F3130 Bipolar disorder, current episode depressed, mild or moderate severity, unspecified: Secondary | ICD-10-CM | POA: Diagnosis not present

## 2024-02-22 DIAGNOSIS — F419 Anxiety disorder, unspecified: Secondary | ICD-10-CM | POA: Diagnosis not present
